# Patient Record
Sex: Male | Born: 1940 | Race: White | Hispanic: No | Marital: Married
Health system: Midwestern US, Community
[De-identification: ages and names within clinical notes are randomized; demographics above are authoritative.]

## PROBLEM LIST (undated history)

## (undated) DIAGNOSIS — R159 Full incontinence of feces: Secondary | ICD-10-CM

## (undated) DIAGNOSIS — D122 Benign neoplasm of ascending colon: Secondary | ICD-10-CM

## (undated) DIAGNOSIS — Z1211 Encounter for screening for malignant neoplasm of colon: Secondary | ICD-10-CM

## (undated) DIAGNOSIS — N201 Calculus of ureter: Secondary | ICD-10-CM

## (undated) DIAGNOSIS — C61 Malignant neoplasm of prostate: Principal | ICD-10-CM

## (undated) DIAGNOSIS — F329 Major depressive disorder, single episode, unspecified: Secondary | ICD-10-CM

## (undated) DIAGNOSIS — Z87442 Personal history of urinary calculi: Secondary | ICD-10-CM

## (undated) DIAGNOSIS — M1711 Unilateral primary osteoarthritis, right knee: Secondary | ICD-10-CM

## (undated) DIAGNOSIS — F32A Depression, unspecified: Secondary | ICD-10-CM

## (undated) DIAGNOSIS — M1990 Unspecified osteoarthritis, unspecified site: Secondary | ICD-10-CM

## (undated) DIAGNOSIS — M75101 Unspecified rotator cuff tear or rupture of right shoulder, not specified as traumatic: Secondary | ICD-10-CM

## (undated) DIAGNOSIS — R972 Elevated prostate specific antigen [PSA]: Secondary | ICD-10-CM

## (undated) DIAGNOSIS — N419 Inflammatory disease of prostate, unspecified: Secondary | ICD-10-CM

## (undated) DIAGNOSIS — E785 Hyperlipidemia, unspecified: Secondary | ICD-10-CM

## (undated) DIAGNOSIS — G4730 Sleep apnea, unspecified: Secondary | ICD-10-CM

## (undated) DIAGNOSIS — M797 Fibromyalgia: Secondary | ICD-10-CM

## (undated) HISTORY — PX: ROTATOR CUFF REPAIR: SHX139

## (undated) HISTORY — DX: Inflammatory disease of prostate, unspecified: N41.9

## (undated) HISTORY — PX: COLONOSCOPY W/ POLYPECTOMY: SHX1380

## (undated) HISTORY — PX: TONSILLECTOMY: SUR1361

## (undated) HISTORY — PX: LEG SURGERY: SHX1003

## (undated) HISTORY — DX: Elevated prostate specific antigen (PSA): R97.20

---

## 2004-01-30 ENCOUNTER — Ambulatory Visit (HOSPITAL_COMMUNITY): Admission: RE | Admit: 2004-01-30 | Discharge: 2004-01-30 | Payer: Self-pay | Admitting: Family Medicine

## 2004-02-08 ENCOUNTER — Encounter: Admission: RE | Admit: 2004-02-08 | Discharge: 2004-02-08 | Payer: Self-pay | Admitting: Internal Medicine

## 2004-03-01 ENCOUNTER — Ambulatory Visit (HOSPITAL_COMMUNITY): Admission: RE | Admit: 2004-03-01 | Discharge: 2004-03-01 | Payer: Self-pay | Admitting: Internal Medicine

## 2004-03-01 ENCOUNTER — Encounter: Admission: RE | Admit: 2004-03-01 | Discharge: 2004-03-01 | Payer: Self-pay | Admitting: Internal Medicine

## 2007-06-30 ENCOUNTER — Ambulatory Visit (HOSPITAL_COMMUNITY): Admission: RE | Admit: 2007-06-30 | Discharge: 2007-06-30 | Payer: Self-pay | Admitting: Family Medicine

## 2007-07-16 HISTORY — PX: TOTAL HIP ARTHROPLASTY: SHX124

## 2007-10-06 ENCOUNTER — Inpatient Hospital Stay (HOSPITAL_COMMUNITY): Admission: RE | Admit: 2007-10-06 | Discharge: 2007-10-10 | Payer: Self-pay | Admitting: Orthopedic Surgery

## 2010-10-30 ENCOUNTER — Ambulatory Visit (INDEPENDENT_AMBULATORY_CARE_PROVIDER_SITE_OTHER): Payer: Medicare Other | Admitting: Urology

## 2010-10-30 DIAGNOSIS — N529 Male erectile dysfunction, unspecified: Secondary | ICD-10-CM

## 2010-10-30 DIAGNOSIS — R310 Gross hematuria: Secondary | ICD-10-CM

## 2010-10-30 DIAGNOSIS — N400 Enlarged prostate without lower urinary tract symptoms: Secondary | ICD-10-CM

## 2010-11-27 NOTE — Discharge Summary (Signed)
NAMECLARIS, Taylor Bean                  ACCOUNT NO.:  [id number]   MEDICAL RECORD NO.:  [id number]          PATIENT TYPE:  INP   LOCATION:  5036                         FACILITY:  [facility]   PHYSICIAN:  Eulas Post, MD    DATE OF BIRTH:  Jun 30, 1941   DATE OF ADMISSION:  10/06/2007  DATE OF DISCHARGE:  10/10/2007                               DISCHARGE SUMMARY   PRIMARY DIAGNOSIS:  Right hip osteoarthritis.   DISCHARGE DIAGNOSIS:  Right hip osteoarthritis.   OPERATIVE PROCEDURE:  Right total hip arthroplasty, performed on October 06, 2007.   DISCHARGE MEDICATIONS:  Coumadin, Ambien, Percocet, and Colace.  We are  dispensing 100 of the Percocet with no refills.   HOSPITAL COURSE:  [name] is a 70 year old man who complained of  right hip arthritis.  He elected to undergo a total hip arthroplasty.  He tolerated the procedure well and postoperatively had no  complications.  He had a routine postoperative course with Coumadin and  sequence of compression devices for DVT prophylaxis.  He also had  perioperative antibiotics.  He passed physical therapy.  He is  weightbearing as tolerated.  His wound were cleaned and he was afebrile  at the time of discharge.  He had a bowel movement.   PLAN:  He will be discharged home with follow up with [place] in  approximately 2-3 weeks.  There were no complications and the patient  benefited maximum from this hospital stay.      Eulas Post, MD  Electronically Signed     JPL/MEDQ  D:  10/10/2007  T:  10/10/2007  Job:  [id number]

## 2010-11-27 NOTE — Op Note (Signed)
Taylor Bean, Taylor Bean                  ACCOUNT NO.:  [id number]   MEDICAL RECORD NO.:  [id number]          PATIENT TYPE:  INP   LOCATION:  2550                         FACILITY:  [facility]   PHYSICIAN:  Eulas Post, MD    DATE OF BIRTH:  01/13/1941   DATE OF PROCEDURE:  10/06/2007  DATE OF DISCHARGE:                               OPERATIVE REPORT   ATTENDING PHYSICIAN:  Eulas Post, M.D.   FIRST ASSISTANT:  Robert A. Thurston Hole, M.D.   SECOND ASSISTANT:  Julien Girt, P.A.-C.   PREOPERATIVE DIAGNOSIS:  Right hip osteoarthritis.   POSTOPERATIVE DIAGNOSIS:  Right hip osteoarthritis.   OPERATIVE PROCEDURE:  Right total hip arthroplasty.   ANESTHESIA:  General.   ESTIMATED BLOOD LOSS:  550 mL.   OPERATIVE IMPLANTS:  DePuy [place] tapered hip stem, non-cemented, size 6  high offset, with a size 36 mm plus 12 femoral head with a size 54  acetabular cup, Pinnacle Sector II, with a Pinnacle Marathon acetabular  line plus 4 10 degrees lipped size 54 by 36.   PREOPERATIVE INDICATIONS:  Taylor Bean is a 70 year old man who  complained of right hip pain.  He had grade 4 degenerative changes in  his hip.  He elected to undergo right total hip arthroplasty.  The  risks, benefits, and alternatives to operative intervention were  discussed with him preoperatively including but not limited to the risks  of infection, bleeding, nerve injury, fracture, dislocation, need for  revision surgery, blood transfusions, blood clots, leg length  discrepancy, cardiopulmonary complications, among others, and he was  willing to proceed.   OPERATIVE FINDINGS:  He had minimal leg length discrepancy  preoperatively.  Postoperatively, we restored him to nearly anatomic  length.  His sciatic nerve was palpated and protected throughout the  case.  His hip was stable throughout functional range of motion after  all implants were in.  He had grade 4 changes on both the acetabulum and  the femoral  head.   OPERATIVE PROCEDURE:  The patient was brought to the operating room and  placed in a supine position.  2 grams intravenous Ancef was given.  General anesthesia was administered.  He was turned into the lateral  decubitus position and all bony prominences were padded.  The right  lower extremity was prepped and draped in the usual sterile fashion.  A  standard posterolateral approach was performed.  The capsule was incised  sharply and tagged with FiberWire.  The hip was then dislocated and we  performed our femoral neck resection.  We then sequentially used the  lateralizing reamer as well as the canal finder and broached up to a  size 6.  An excellent restoration of length was achieved based on our  femoral neck cut.  We then exposed the acetabulum.  The labrum was  removed and we sequentially reamed up to a 53.  The 54 trial fit  extremely well.  We then impacted the real acetabular cup and it had an  excellent fit and was completely stable without any screw fixation.  We  tried the lip liner in the appropriate alignment and ultimately placed  it with the lip being directed at approximately 9 o'clock.  We then  placed the lip liner after having trialed the different femoral head  sizes and selected the above knee components.  The final implants were  placed and the hip was taken through a range of motion and was found to  have excellent stability.  We then repaired the capsule and piriformis  with #2 FiberWire through drill holes in the greater trochanter.  Excellent soft tissue protection posteriorly was achieved.  We then  copiously irrigated the wound and closed the fascia with #1 Ethibond  followed by 2-0 Vicryl for the subcutaneous tissue and a 4-0 Monocryl  for the skin.  Steri-Strips were placed and Marcaine was injected.  A  sterile gauze was applied and an abduction pillow.  He was turned supine  and returned to the PACU in stable, satisfactory condition.   Postoperatively, his leg lengths were approximately equal.  There were  no complications.  The patient tolerated the procedure well.      Eulas Post, MD  Electronically Signed     JPL/MEDQ  D:  10/06/2007  T:  10/06/2007  Job:  [id number]

## 2011-04-08 LAB — BASIC METABOLIC PANEL
BUN: 10
BUN: 18
BUN: 8
CO2: 30
CO2: 30
Calcium: 8.1 — ABNORMAL LOW
Calcium: 8.4
Calcium: 9.9
Chloride: 103
Chloride: 105
Chloride: 97
Creatinine, Ser: 1.06
GFR calc Af Amer: >60
GFR calc Af Amer: >60
GFR calc non Af Amer: >60
GFR calc non Af Amer: >60
GFR calc non Af Amer: >60
Glucose, Bld: 116 — ABNORMAL HIGH
Glucose, Bld: 127 — ABNORMAL HIGH
Glucose, Bld: 128 — ABNORMAL HIGH
Potassium: 3.9
Potassium: 4
Potassium: 4.1
Sodium: 136
Sodium: 137

## 2011-04-08 LAB — CBC
HCT: 26.5 — ABNORMAL LOW
HCT: 41.5
Hemoglobin: 10 — ABNORMAL LOW
Hemoglobin: 9.3 — ABNORMAL LOW
MCHC: 35.1
Platelets: 266
RBC: 2.93 — ABNORMAL LOW
RBC: 2.94 — ABNORMAL LOW
RBC: 3.15 — ABNORMAL LOW
RBC: 4.61
RDW: 12.7
RDW: 12.9
RDW: 13
WBC: 5.6
WBC: 6.7
WBC: 8.3

## 2011-04-08 LAB — PROTIME-INR
INR: 1
INR: 1.8 — ABNORMAL HIGH
Prothrombin Time: 12.9
Prothrombin Time: 15.1

## 2011-04-08 LAB — URINALYSIS, ROUTINE W REFLEX MICROSCOPIC
Glucose, UA: NEGATIVE
Hgb urine dipstick: NEGATIVE
Protein, ur: NEGATIVE
Specific Gravity, Urine: 1.024
pH: 5

## 2011-04-08 LAB — TYPE AND SCREEN: Antibody Screen: NEGATIVE

## 2011-04-30 ENCOUNTER — Ambulatory Visit (INDEPENDENT_AMBULATORY_CARE_PROVIDER_SITE_OTHER): Payer: Medicare Other | Admitting: Urology

## 2011-04-30 DIAGNOSIS — R310 Gross hematuria: Secondary | ICD-10-CM

## 2011-05-02 ENCOUNTER — Encounter (HOSPITAL_COMMUNITY): Payer: Self-pay

## 2011-05-02 ENCOUNTER — Ambulatory Visit (HOSPITAL_COMMUNITY)
Admission: RE | Admit: 2011-05-02 | Discharge: 2011-05-02 | Disposition: A | Discharge status: Home / Self Care | Payer: BC Managed Care – PPO | Source: Ambulatory Visit | Attending: Cardiovascular Disease | Admitting: Cardiovascular Disease

## 2011-05-02 DIAGNOSIS — R079 Chest pain, unspecified: Secondary | ICD-10-CM | POA: Insufficient documentation

## 2011-05-02 HISTORY — DX: Sleep apnea, unspecified: G47.30

## 2011-05-02 HISTORY — DX: Hyperlipidemia, unspecified: E78.5

## 2011-05-02 HISTORY — DX: Unspecified osteoarthritis, unspecified site: M19.90

## 2011-05-02 NOTE — Procedures (Signed)
Taylor Bean, Taylor Bean NO.:  [id number]  MEDICAL RECORD NO.:  [id number]  LOCATION:  [facility]                          FACILITY:  [facility]  PHYSICIAN:  Thurmon Fair, MD     DATE OF BIRTH:  1940/09/03  DATE OF PROCEDURE:  05/02/2011 DATE OF DISCHARGE:                                 STRESS TEST   Treadmill Stress Test  Taylor Bean is a 70 year old gentleman with atypical chest discomfort.  He underwent graded exercise treadmill stress test using a standardized Bruce protocol.  At baseline, the heart rate was 73 beats per minute and blood pressure was 128/62 mmHg.  The patient exercised for a total of 5 minutes and 12 seconds, into the second stage of the Bruce protocol.  The maximum heart rate achieved was 157 beats per minute, which represents the maximum predicted for heart for age.  The peak blood pressure was 208/80 mmHg.  The patient did not complain of angina or any other type of chest discomfort during exercise.  The study was stopped due to fatigue and shortness of breath.  There were no electrocardiographic changes to suggest coronary insufficiency.  ASSESSMENT/PLAN: 1. Fair exercise tolerance. 2. Normotensive response to exercise. 3. No evidence of exercise-induced ischemia.  CONCLUSION:  Normal stress test.     Thurmon Fair, MD     MC/MEDQ  D:  05/02/2011  T:  05/02/2011  Job:  [id number]  cc:   Kirk Ruths, M.D. Fax: [phone]  [facility]

## 2011-05-02 NOTE — Progress Notes (Signed)
Stress Lab Nurses Notes - [facility]  JAMARKIS BRANAM 05/02/2011  Reason for doing test: Chest Pain  Type of test: Regular GTX  Nurse performing test: Parke Poisson, RN  Nuclear Medicine Tech: Not Applicable  Echo Tech: Not Applicable  MD performing test: Thurmon Fair, MD  Family MD: McGough  Test explained and consent signed: yes  IV started: No IV started  Symptoms: Fatigue and slight SOB  Treatment/Intervention: None  Reason test stopped: fatigue and reached target HR  After recovery IV was: NA  Patient to return to Nuc. Med at : NA  Patient discharged: Home  Patient's Condition upon discharge was: stable  Comments: During stress test peak BP 178/78 & HR 157.  Recovery BP 138/70 & HR 88.  Symptoms resolved in recovery.  Erskine Speed T

## 2011-05-02 NOTE — Progress Notes (Signed)
Encounter addended by: Tawni Millers, RN on: 05/02/2011 12:52 PM<BR>     Documentation filed: Visit Diagnoses, Orders

## 2011-11-29 ENCOUNTER — Other Ambulatory Visit: Payer: Self-pay | Admitting: Orthopedic Surgery

## 2011-12-03 ENCOUNTER — Encounter (HOSPITAL_BASED_OUTPATIENT_CLINIC_OR_DEPARTMENT_OTHER): Payer: Self-pay | Admitting: *Deleted

## 2011-12-03 NOTE — Progress Notes (Signed)
No labs needed His pcp wanted a stress test-ni 10/12 Uses cpap-to bring dos-to bring all meds and overnight bag in case he needs to stay

## 2011-12-04 MED ORDER — MIDAZOLAM HCL 2 MG/2ML IJ SOLN
INTRAMUSCULAR | Status: AC
  Filled 2011-12-04: qty 2

## 2011-12-04 MED ORDER — FENTANYL CITRATE 0.05 MG/ML IJ SOLN
INTRAMUSCULAR | Status: AC
  Filled 2011-12-04: qty 2

## 2011-12-06 ENCOUNTER — Ambulatory Visit (HOSPITAL_BASED_OUTPATIENT_CLINIC_OR_DEPARTMENT_OTHER)
Admission: RE | Admit: 2011-12-06 | Payer: Worker's Compensation | Source: Ambulatory Visit | Admitting: Orthopedic Surgery

## 2011-12-06 ENCOUNTER — Encounter (HOSPITAL_BASED_OUTPATIENT_CLINIC_OR_DEPARTMENT_OTHER): Admission: RE | Payer: Self-pay | Source: Ambulatory Visit

## 2011-12-06 HISTORY — DX: Major depressive disorder, single episode, unspecified: F32.9

## 2011-12-06 HISTORY — DX: Depression, unspecified: F32.A

## 2011-12-06 SURGERY — SHOULDER ARTHROSCOPY WITH OPEN ROTATOR CUFF REPAIR AND DISTAL CLAVICLE ACROMINECTOMY
Anesthesia: General | Site: Shoulder | Laterality: Right

## 2011-12-11 ENCOUNTER — Other Ambulatory Visit: Payer: Self-pay | Admitting: Orthopedic Surgery

## 2011-12-11 NOTE — Progress Notes (Signed)
Surgery r/s due to dr Dion Saucier schedule-reviewed all preop-

## 2011-12-13 ENCOUNTER — Encounter (HOSPITAL_BASED_OUTPATIENT_CLINIC_OR_DEPARTMENT_OTHER): Payer: Self-pay | Admitting: Certified Registered Nurse Anesthetist

## 2011-12-13 ENCOUNTER — Encounter (HOSPITAL_BASED_OUTPATIENT_CLINIC_OR_DEPARTMENT_OTHER)
Admission: RE | Disposition: A | Discharge status: Home / Self Care | Payer: Self-pay | Source: Ambulatory Visit | Attending: Orthopedic Surgery

## 2011-12-13 ENCOUNTER — Encounter (HOSPITAL_BASED_OUTPATIENT_CLINIC_OR_DEPARTMENT_OTHER): Payer: Self-pay | Admitting: Orthopedic Surgery

## 2011-12-13 ENCOUNTER — Encounter (HOSPITAL_BASED_OUTPATIENT_CLINIC_OR_DEPARTMENT_OTHER): Payer: Self-pay

## 2011-12-13 ENCOUNTER — Ambulatory Visit (HOSPITAL_BASED_OUTPATIENT_CLINIC_OR_DEPARTMENT_OTHER): Payer: Worker's Compensation | Admitting: Certified Registered Nurse Anesthetist

## 2011-12-13 ENCOUNTER — Ambulatory Visit (HOSPITAL_BASED_OUTPATIENT_CLINIC_OR_DEPARTMENT_OTHER)
Admission: RE | Admit: 2011-12-13 | Discharge: 2011-12-13 | Disposition: A | Discharge status: Home / Self Care | Payer: Worker's Compensation | Source: Ambulatory Visit | Attending: Orthopedic Surgery | Admitting: Orthopedic Surgery

## 2011-12-13 DIAGNOSIS — M719 Bursopathy, unspecified: Secondary | ICD-10-CM | POA: Insufficient documentation

## 2011-12-13 DIAGNOSIS — E785 Hyperlipidemia, unspecified: Secondary | ICD-10-CM | POA: Insufficient documentation

## 2011-12-13 DIAGNOSIS — M67919 Unspecified disorder of synovium and tendon, unspecified shoulder: Secondary | ICD-10-CM | POA: Insufficient documentation

## 2011-12-13 DIAGNOSIS — M75101 Unspecified rotator cuff tear or rupture of right shoulder, not specified as traumatic: Secondary | ICD-10-CM

## 2011-12-13 DIAGNOSIS — G4733 Obstructive sleep apnea (adult) (pediatric): Secondary | ICD-10-CM | POA: Insufficient documentation

## 2011-12-13 HISTORY — DX: Unspecified rotator cuff tear or rupture of right shoulder, not specified as traumatic: M75.101

## 2011-12-13 LAB — POCT HEMOGLOBIN-HEMACUE: Hemoglobin: 14.9 g/dL (ref 13.0–17.0)

## 2011-12-13 SURGERY — ARTHROSCOPY, SHOULDER, WITH ROTATOR CUFF REPAIR
Anesthesia: General | Site: Shoulder | Laterality: Right

## 2011-12-13 MED ORDER — SUCCINYLCHOLINE CHLORIDE 20 MG/ML IJ SOLN
INTRAMUSCULAR | Status: DC | PRN
  Administered 2011-12-13: 140 mg via INTRAVENOUS

## 2011-12-13 MED ORDER — PROPOFOL 10 MG/ML IV EMUL
INTRAVENOUS | Status: DC | PRN
  Administered 2011-12-13: 170 mg via INTRAVENOUS
  Administered 2011-12-13: 80 mg via INTRAVENOUS

## 2011-12-13 MED ORDER — PROMETHAZINE HCL 25 MG PO TABS: 25.0000 mg | ORAL_TABLET | Freq: Four times a day (QID) | ORAL | Status: DC | PRN

## 2011-12-13 MED ORDER — FENTANYL CITRATE 0.05 MG/ML IJ SOLN
100.0000 ug | Freq: Once | INTRAMUSCULAR | Status: AC
  Administered 2011-12-13: 100 ug via INTRAVENOUS

## 2011-12-13 MED ORDER — LACTATED RINGERS IV SOLN
INTRAVENOUS | Status: DC
  Administered 2011-12-13 (×2): via INTRAVENOUS

## 2011-12-13 MED ORDER — CEFAZOLIN SODIUM 1-5 GM-% IV SOLN: 1.0000 g | INTRAVENOUS | Status: DC

## 2011-12-13 MED ORDER — FENTANYL CITRATE 0.05 MG/ML IJ SOLN
INTRAMUSCULAR | Status: DC | PRN
  Administered 2011-12-13 (×2): 50 ug via INTRAVENOUS

## 2011-12-13 MED ORDER — MIDAZOLAM HCL 2 MG/2ML IJ SOLN
2.0000 mg | Freq: Once | INTRAMUSCULAR | Status: AC
  Administered 2011-12-13: 2 mg via INTRAVENOUS

## 2011-12-13 MED ORDER — CEFAZOLIN SODIUM 1-5 GM-% IV SOLN
1.0000 g | INTRAVENOUS | Status: AC
  Administered 2011-12-13: 2 g via INTRAVENOUS

## 2011-12-13 MED ORDER — OXYCODONE-ACETAMINOPHEN 10-325 MG PO TABS: 1.0000 | ORAL_TABLET | Freq: Four times a day (QID) | ORAL | Status: AC | PRN

## 2011-12-13 MED ORDER — LIDOCAINE HCL (CARDIAC) 20 MG/ML IV SOLN
INTRAVENOUS | Status: DC | PRN
  Administered 2011-12-13: 40 mg via INTRAVENOUS

## 2011-12-13 MED ORDER — SODIUM CHLORIDE 0.9 % IR SOLN
Status: DC | PRN
  Administered 2011-12-13: 21000 mL

## 2011-12-13 MED ORDER — DEXAMETHASONE SODIUM PHOSPHATE 4 MG/ML IJ SOLN
INTRAMUSCULAR | Status: DC | PRN
  Administered 2011-12-13: 6 mg via INTRAVENOUS

## 2011-12-13 MED ORDER — METHOCARBAMOL 500 MG PO TABS: 500.0000 mg | ORAL_TABLET | Freq: Four times a day (QID) | ORAL | Status: AC

## 2011-12-13 MED ORDER — ONDANSETRON HCL 4 MG/2ML IJ SOLN
INTRAMUSCULAR | Status: DC | PRN
  Administered 2011-12-13: 4 mg via INTRAVENOUS

## 2011-12-13 SURGICAL SUPPLY — 69 items
ANCHOR SUT BIO SW 4.75X19.1 (Anchor) ×4 IMPLANT
BENZOIN TINCTURE PRP APPL 2/3 (GAUZE/BANDAGES/DRESSINGS) ×2 IMPLANT
BLADE 4.2CUDA (BLADE) IMPLANT
BLADE CUDA GRT WHITE 3.5 (BLADE) IMPLANT
BLADE CUTTER GATOR 3.5 (BLADE) ×2 IMPLANT
BLADE GREAT WHITE 4.2 (BLADE) IMPLANT
BLADE SURG 15 STRL LF DISP TIS (BLADE) IMPLANT
BLADE SURG 15 STRL SS (BLADE)
BUR OVAL 6.0 (BURR) ×2 IMPLANT
CANISTER OMNI JUG 16 LITER (MISCELLANEOUS) ×2 IMPLANT
CANNULA 5.75X71 LONG (CANNULA) ×2 IMPLANT
CANNULA ACUFLEX KIT 5X76 (CANNULA) IMPLANT
CANNULA TWIST IN 8.25X7CM (CANNULA) ×2 IMPLANT
CANNULA TWIST IN 8.25X9CM (CANNULA) IMPLANT
CLOTH BEACON ORANGE TIMEOUT ST (SAFETY) ×2 IMPLANT
CUTTER MENISCUS  4.2MM (BLADE)
CUTTER MENISCUS 4.2MM (BLADE) IMPLANT
DECANTER SPIKE VIAL GLASS SM (MISCELLANEOUS) IMPLANT
DRAPE INCISE IOBAN 66X45 STRL (DRAPES) ×2 IMPLANT
DRAPE SHOULDER BEACH CHAIR (DRAPES) ×2 IMPLANT
DRAPE U 20/CS (DRAPES) ×2 IMPLANT
DRAPE U-SHAPE 47X51 STRL (DRAPES) ×2 IMPLANT
DRSG PAD ABDOMINAL 8X10 ST (GAUZE/BANDAGES/DRESSINGS) ×2 IMPLANT
DURAPREP 26ML APPLICATOR (WOUND CARE) ×2 IMPLANT
ELECT REM PT RETURN 9FT ADLT (ELECTROSURGICAL) ×2
ELECTRODE REM PT RTRN 9FT ADLT (ELECTROSURGICAL) ×1 IMPLANT
FIBERSTICK 2 (SUTURE) IMPLANT
GLOVE BIO SURGEON STRL SZ 6 (GLOVE) ×2 IMPLANT
GLOVE BIO SURGEON STRL SZ 6.5 (GLOVE) ×2 IMPLANT
GLOVE BIO SURGEON STRL SZ8 (GLOVE) ×2 IMPLANT
GLOVE BIOGEL PI IND STRL 7.0 (GLOVE) ×2 IMPLANT
GLOVE BIOGEL PI IND STRL 8 (GLOVE) ×2 IMPLANT
GLOVE BIOGEL PI INDICATOR 7.0 (GLOVE) ×2
GLOVE BIOGEL PI INDICATOR 8 (GLOVE) ×2
GLOVE INDICATOR 6.5 STRL GRN (GLOVE) ×2 IMPLANT
GLOVE ORTHO TXT STRL SZ7.5 (GLOVE) ×2 IMPLANT
GOWN PREVENTION PLUS XLARGE (GOWN DISPOSABLE) IMPLANT
GOWN PREVENTION PLUS XXLARGE (GOWN DISPOSABLE) IMPLANT
IMMOBILIZER SHOULDER XLGE (ORTHOPEDIC SUPPLIES) ×2 IMPLANT
KIT SHOULDER TRACTION (DRAPES) ×2 IMPLANT
NEEDLE SCORPION MULTI FIRE (NEEDLE) ×2 IMPLANT
PACK ARTHROSCOPY DSU (CUSTOM PROCEDURE TRAY) ×2 IMPLANT
PACK BASIN DAY SURGERY FS (CUSTOM PROCEDURE TRAY) ×2 IMPLANT
SET ARTHROSCOPY TUBING (MISCELLANEOUS) ×1
SET ARTHROSCOPY TUBING LN (MISCELLANEOUS) ×1 IMPLANT
SHEET MEDIUM DRAPE 40X70 STRL (DRAPES) ×2 IMPLANT
SLEEVE SCD COMPRESS KNEE MED (MISCELLANEOUS) ×2 IMPLANT
SLING ARM FOAM STRAP LRG (SOFTGOODS) IMPLANT
SLING ARM FOAM STRAP MED (SOFTGOODS) IMPLANT
SLING ARM FOAM STRAP XLG (SOFTGOODS) IMPLANT
SLING ARM IMMOBILIZER LRG (SOFTGOODS) IMPLANT
SLING ARM IMMOBILIZER MED (SOFTGOODS) IMPLANT
SPONGE GAUZE 4X4 12PLY (GAUZE/BANDAGES/DRESSINGS) ×2 IMPLANT
STRIP CLOSURE SKIN 1/2X4 (GAUZE/BANDAGES/DRESSINGS) ×2 IMPLANT
SUT FIBERWIRE #2 38 T-5 BLUE (SUTURE)
SUT LASSO 45 DEGREE (SUTURE) ×2 IMPLANT
SUT MNCRL AB 4-0 PS2 18 (SUTURE) IMPLANT
SUT PDS AB 1 CT  36 (SUTURE)
SUT PDS AB 1 CT 36 (SUTURE) IMPLANT
SUT TIGER TAPE 7 IN WHITE (SUTURE) ×2 IMPLANT
SUT VIC AB 3-0 SH 27 (SUTURE)
SUT VIC AB 3-0 SH 27X BRD (SUTURE) IMPLANT
SUTURE FIBERWR #2 38 T-5 BLUE (SUTURE) IMPLANT
TAPE FIBER 2MM 7IN #2 BLUE (SUTURE) ×2 IMPLANT
TOWEL OR 17X24 6PK STRL BLUE (TOWEL DISPOSABLE) IMPLANT
TOWEL OR NON WOVEN STRL DISP B (DISPOSABLE) ×2 IMPLANT
TUBE CONNECTING 20X1/4 (TUBING) ×2 IMPLANT
WAND STAR VAC 90 (SURGICAL WAND) ×2 IMPLANT
WATER STERILE IRR 1000ML POUR (IV SOLUTION) ×2 IMPLANT

## 2011-12-13 NOTE — H&P (Signed)
PREOPERATIVE H&P  Chief Complaint: right shoulder complete rotator cuff rupture  HPI: Taylor Bean is a 71 y.o. male who presents for preoperative history and physical with a diagnosis of right shoulder complete rotator cuff rupture. Symptoms are rated as moderate to severe, and have been worsening.  This is significantly impairing activities of daily living.  He has elected for surgical management.   Past Medical History  Diagnosis Date  . Hyperlipidemia   . Sleep apnea     uses a cpap-will bring dos  . Arthritis     general  . Depression    Past Surgical History  Procedure Date  . 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 Joint replacement 2009    rt total hip  . Tonsillectomy   . Leg surgery     fx rt leg as 71 yr old   History   Social History  . Marital Status: Married    Spouse Name: N/A    Number of Children: N/A  . Years of Education: N/A   Social History Main Topics  . Smoking status: Former Smoker -- 0.2 packs/day for 10 years    Types: Cigarettes    Quit date: 08/01/1968  . Smokeless tobacco: Not on file  . Alcohol Use: 0.6 oz/week    1 Cans of beer per week  . Drug Use: No  . Sexually Active:    Other Topics Concern  . Not on file   Social History Narrative  . No narrative on file   Family History  Problem Relation Age of Onset  . Heart attack Father    No Known Allergies Prior to Admission medications   Medication Sig Start Date End Date Taking? Authorizing Provider  aspirin 81 MG tablet Take 81 mg by mouth daily.      Historical Provider, MD  diclofenac (VOLTAREN) 75 MG EC tablet Take 75 mg by mouth 2 (two) times daily.      Historical Provider, MD  escitalopram (LEXAPRO) 10 MG tablet Take 10 mg by mouth daily.      Historical Provider, MD  FLUoxetine (PROZAC) 20 MG capsule Take 20 mg by mouth daily.      Historical Provider, MD  simvastatin (ZOCOR) 20 MG tablet Take 20 mg by mouth at bedtime.      Historical Provider, MD  traMADol (ULTRAM)  50 MG tablet Take 50 mg by mouth every 6 (six) hours as needed.    Historical Provider, MD     Positive ROS: All other systems have been reviewed and were otherwise negative with the exception of those mentioned in the HPI and as above.  Physical Exam: General: Alert, no acute distress Cardiovascular: No pedal edema Respiratory: No cyanosis, no use of accessory musculature GI: No organomegaly, abdomen is soft and non-tender Skin: No lesions in the area of chief complaint Neurologic: Sensation intact distally Psychiatric: Patient is competent for consent with normal mood and affect Lymphatic: No axillary or cervical lymphadenopathy  MUSCULOSKELETAL: Active forward flexion is 0-160 with a positive drop arm sign, and weakness with overhead function.  Assessment: right shoulder complete rotator cuff rupture  Plan: Plan for Procedure(s): SHOULDER ARTHROSCOPY WITH ROTATOR CUFF REPAIR, acromioplasty, probable debridement.  The risks benefits and alternatives were discussed with the patient including but not limited to the risks of nonoperative treatment, versus surgical intervention including infection, bleeding, nerve injury,  blood clots, cardiopulmonary complications, morbidity, mortality, among others, and they were willing to proceed. We've also discussed  the risks of recurrent rupture, incomplete relief of symptoms, the need for revision surgery, among others.  Eulas Post, MD 12/13/2011 8:40 AM

## 2011-12-13 NOTE — Anesthesia Procedure Notes (Addendum)
Anesthesia Regional Block:  Interscalene brachial plexus block  Pre-Anesthetic Checklist: ,, timeout performed, Correct Patient, Correct Site, Correct Laterality, Correct Procedure, Correct Position, site marked, Risks and benefits discussed,  Surgical consent,  Pre-op evaluation,  At surgeon's request and post-op pain management  Laterality: Right  Prep: chloraprep       Needles:  Injection technique: Single-shot  Needle Type: Echogenic Stimulator Needle      Needle Gauge: 22 and 22 G    Additional Needles:  Procedures: ultrasound guided Interscalene brachial plexus block Narrative:  Start time: 12/13/2011 9:55 AM End time: 12/13/2011 10:05 AM  Performed by: Personally   Additional Notes: 30 cc 0.5% Naropin injected without difficulty   Procedure Name: Intubation Date/Time: 12/13/2011 12:16 PM Performed by: Verlan Friends Pre-anesthesia Checklist: Patient identified, Emergency Drugs available, Suction available, Patient being monitored and Timeout performed Patient Re-evaluated:Patient Re-evaluated prior to inductionOxygen Delivery Method: Circle System Utilized Preoxygenation: Pre-oxygenation with 100% oxygen Intubation Type: IV induction Ventilation: Mask ventilation without difficulty Laryngoscope size: intubated with #4 glidescope by D. Joslin. Grade View: Grade III Tube type: Oral Tube size: 8.0 mm Number of attempts: 2 (no visualization with #3 Hyacinth Meeker, immediately went with glidescope) Airway Equipment and Method: stylet and oral airway Placement Confirmation: ETT inserted through vocal cords under direct vision,  positive ETCO2 and breath sounds checked- equal and bilateral Secured at: 23 cm Tube secured with: Tape (pink tape used) Dental Injury: Teeth and Oropharynx as per pre-operative assessment  Difficulty Due To: Difficulty was anticipated, Difficult Airway- due to reduced neck mobility and Difficult Airway- due to anterior larynx Future  Recommendations: Recommend- induction with short-acting agent, and alternative techniques readily available Comments: Poor neck extension, cords anterior.  Great visualization with glidescope #4.

## 2011-12-13 NOTE — Discharge Instructions (Addendum)
Shoulder Arthroscopy Because the shoulder is one of the most mobile joints, it is more prone to injury. It is a very shallow ball and socket joint located between the large bone in your upper arm (humerus) and the shoulder blade (scapula). Arthroscopy is a valuable test for evaluating and treating injuries involving the shoulder joint. Arthroscopy is a surgical technique which uses small incisions (cuts by the surgeon) to insert a small telescope like instrument (arthroscope) and other tools into the shoulder. This allows the surgeon to look directly at the problem. When the arthroscope is in the joint, fluid is used to expand the joint space. This allows the surgeon to examine it more easily. The arthroscope then beams light into the joint and sends an image to a TV screen. As your surgeon examines your shoulder, he or she can also repair a number of problems found at the same time. Sometimes the procedure may change to an open surgery. This would happen if the problems are severe enough that they cannot be corrected with just arthroscopy. This is usually a very safe surgery. Rare complications include damage to nerves or blood vessels, excess bleeding, blood clots, infection, and rarely instrument failure. This is most often performed as a same day surgery. This means you will not have to stay in the hospital overnight. Recovery from this surgery is also much faster than having an open procedure. LET YOUR CAREGIVER KNOW ABOUT:  Allergies.   Medications taken including herbs, eye drops, over the counter medications, and creams.   Use of steroids (by mouth or creams).   Previous problems with anesthetics or novocaine.   Possibility of pregnancy, if this applies.   History of blood clots (thrombophlebitis).   History of bleeding or blood problems.   Previous surgery.   Other health problems.   Family history of anesthetic problems.  BEFORE THE PROCEDURE   Stop all anti-inflammatory  medications at least one week before surgery unless instructed otherwise. Tell your surgeon if you have been taking cortisone or other steroids.   Do not eat or drink after midnight or as instructed. Take medications as directed by your caregiver. You may have lab tests the morning of surgery.   You should be present 60 minutes prior to your procedure or as directed.  PROCEDURE  You may have general (go to sleep) or local (numb the area) anesthetic. Your surgeon will discuss this with you. During the procedure as discussed above, your surgeon may find a variety of problems which he or she can improve or correct using small instruments. When the procedure is finished the tiny incisions will be closed with stitches or tape. AFTER YOUR PROCEDURE  After surgery you will be taken to the recovery area. A nurse will watch and check your progress. Once you are awake, stable, and taking fluids well, barring other problems you will be allowed to go home.   Once home, apply an ice pack to your operative site for twenty minutes, three to four times per day, for two to three days. This may help with discomfort and keep the swelling down.   Use a sling and medications if prescribed or as instructed.   Unless your caregiver advises otherwise, move your arm and shoulder gently and frequently following the procedure. This can help prevent stiffness and swelling.  REHABILITATION  Almost as important as your surgery is your rehabilitation. If physical therapy and exercises are prescribed by your surgeon, follow them diligently. Once comfortable and on your way  to full use, do muscle strengthening exercises as instructed.   Only take over-the-counter or prescription medicines for pain, discomfort, or fever as directed by your caregiver.  SEEK IMMEDIATE MEDICAL CARE IF:   There is redness, swelling, or increasing pain in the wound or joint.   You notice purulent (colored- pus-like) drainage coming from the  wound.   An unexplained oral temperature above 102 F (38.9 C) develops.   You notice a foul smell coming from the wound or dressing.   There is a breaking open of the wound. The edges do not stay together after sutures or tape has been removed.   Persistent bleeding from the small incision.  Document Released: 06/28/2000 Document Revised: 06/20/2011 Document Reviewed: 10/17/2008 ExitCare Patient Information 2012 [place], [facility]   Post Anesthesia Home Care Instructions  Activity: Get plenty of rest for the remainder of the day. A responsible adult should stay with you for 24 hours following the procedure.  For the next 24 hours, DO NOT: -Drive a car -Advertising copywriter -Drink alcoholic beverages -Take any medication unless instructed by your physician -Make any legal decisions or sign important papers.  Meals: Start with liquid foods such as gelatin or soup. Progress to regular foods as tolerated. Avoid greasy, spicy, heavy foods. If nausea and/or vomiting occur, drink only clear liquids until the nausea and/or vomiting subsides. Call your physician if vomiting continues.  Special Instructions/Symptoms: Your throat may feel dry or sore from the anesthesia or the breathing tube placed in your throat during surgery. If this causes discomfort, gargle with warm salt water. The discomfort should disappear within 24 hours.   Call your surgeon if you experience:   1.  Fever over 101.0. 2.  Inability to urinate. 3.  Nausea and/or vomiting. 4.  Extreme swelling or bruising at the surgical site. 5.  Continued bleeding from the incision. 6.  Increased pain, redness or drainage from the incision. 7.  Problems related to your pain medication.   Regional Anesthesia Blocks  1. Numbness or the inability to move the "blocked" extremity may last from 3-48 hours after placement. The length of time depends on the medication injected and your individual response to the medication. If the  numbness is not going away after 48 hours, call your surgeon.  2. The extremity that is blocked will need to be protected until the numbness is gone and the  Strength has returned. Because you cannot feel it, you will need to take extra care to avoid injury. Because it may be weak, you may have difficulty moving it or using it. You may not know what position it is in without looking at it while the block is in effect.  3. For blocks in the legs and feet, returning to weight bearing and walking needs to be done carefully. You will need to wait until the numbness is entirely gone and the strength has returned. You should be able to move your leg and foot normally before you try and bear weight or walk. You will need someone to be with you when you first try to ensure you do not fall and possibly risk injury.  4. Bruising and tenderness at the needle site are common side effects and will resolve in a few days.  5. Persistent numbness or new problems with movement should be communicated to the surgeon or the [facility] [phone] [facility] [phone]). Marland Kitchen

## 2011-12-13 NOTE — Op Note (Signed)
12/13/2011  2:30 PM  PATIENT:  Taylor Bean    PRE-OPERATIVE DIAGNOSIS:  right shoulder complete rotator cuff rupture  POST-OPERATIVE DIAGNOSIS:  Same  PROCEDURE:  SHOULDER ARTHROSCOPY WITH ROTATOR CUFF REPAIR, acromioplasty, limited debridement of the labrum.  SURGEON:  Eulas Post, MD  PHYSICIAN ASSISTANT: Janace Litten, OPA-C, present and scrubbed throughout the case, critical for completion in a timely fashion, and for retraction, instrumentation, and closure.  ANESTHESIA:   General  PREOPERATIVE INDICATIONS:  Taylor Bean is a  71 y.o. male with a diagnosis of right shoulder complete rotator cuff rupture who failed conservative measures and elected for surgical management.    The risks benefits and alternatives were discussed with the patient preoperatively including but not limited to the risks of infection, bleeding, nerve injury, cardiopulmonary complications, the need for revision surgery, among others, and the patient was willing to proceed.  OPERATIVE IMPLANTS: Arthrex bio composite swivel lock 4.75 mm x2 with a total of 2 inverted fiber tape sutures.  OPERATIVE FINDINGS: There was a full-thickness rotator cuff tear with delamination and retraction, measuring approximately 2 x 2 centimeters. The rotator cuff tendon was quite good, particularly on the inferior leaflet. The biceps tendon was intact. The glenohumeral joint had reasonably good cartilage. The superior labrum had fraying, as well as some of the anterior labrum. The infraspinatus was intact, along with the subscapularis and the biceps pulley. There was substantial subacromial spurring, and in fact what appeared to be an unfused os acromiale anteriorly. This is fairly small.  OPERATIVE PROCEDURE: The patient was brought to the operating room and placed in the supine position. General anesthesia was administered. IV antibiotics were given. He was turned into the semilateral decubitus position and all bony prominences  were padded. We took special care because of his previous neck problems. The right upper extremity was, and did have some mild stiffness with overhead activity. The right upper extremity was prepped and draped in usual sterile fashion. Time out was performed. Diagnostic arthroscopy was carried out the above-named findings. The arthroscopic shaver was used to debride the anterior labrum as well as the superior labrum as well as portions of the undersurface of the rotator cuff.  I then went into the subacromial space. Complete bursectomy was performed with the shaver. I released the CA ligament and then performed an acromioplasty with a bur. I also prepared the tuberosity with a bur.  I then viewed from the lateral portal, and I tried to use the scorpion, but the tendon was too thick, so and it up using the minimize her portal, with a 45 suture lasso, 2 grasp both the superior and inferior leaflet of the rotator cuff. This was excellent quality tissue, and I then passed a total of 2 inverted fiber tape, one anteriorly and one posteriorly.  I then anchored these to the bone using swivel lock anchors from Arthrex. This was placed laterally, and the tendon mobilized back to its anatomic footprint. Excellent fixation was achieved. I confirmed the acromioplasty viewing from the lateral view. I then removed all of the arthroscopic instruments, and repaired the portals with Monocryl followed by Steri-Strips and sterile gauze. He was awakened and returned back in stable and satisfactory condition. There no complications and he tolerated the procedure well.

## 2011-12-13 NOTE — Anesthesia Postprocedure Evaluation (Signed)
Anesthesia Post Note  Patient: Taylor Bean  Procedure(s) Performed: Procedure(s) (LRB): SHOULDER ARTHROSCOPY WITH ROTATOR CUFF REPAIR (Right)  Anesthesia type: general  Patient location: PACU  Post pain: Pain level controlled  Post assessment: Patient's Cardiovascular Status Stable  Last Vitals:  Filed Vitals:   12/13/11 1515  BP: 136/71  Pulse: 77  Temp:   Resp: 13    Post vital signs: Reviewed and stable  Level of consciousness: sedated  Complications: No apparent anesthesia complications

## 2011-12-13 NOTE — Transfer of Care (Signed)
Immediate Anesthesia Transfer of Care Note  Patient: Taylor Bean  Procedure(s) Performed: Procedure(s) (LRB): SHOULDER ARTHROSCOPY WITH ROTATOR CUFF REPAIR (Right)  Patient Location: PACU  Anesthesia Type: GA combined with regional for post-op pain  Level of Consciousness: awake, alert , oriented and patient cooperative  Airway & Oxygen Therapy: Patient Spontanous Breathing and Patient connected to face mask oxygen  Post-op Assessment: Report given to PACU RN and Post -op Vital signs reviewed and stable  Post vital signs: Reviewed and stable  Complications: No apparent anesthesia complications

## 2011-12-13 NOTE — Anesthesia Preprocedure Evaluation (Signed)
Anesthesia Evaluation  Patient identified by MRN, date of birth, ID band Patient awake    Reviewed: Allergy & Precautions, H&P , NPO status , Patient's Chart, lab work & pertinent test results  Airway Mallampati: II TM Distance: >3 FB     Dental  (+) Teeth Intact   Pulmonary  breath sounds clear to auscultation        Cardiovascular Rhythm:Regular Rate:Normal     Neuro/Psych    GI/Hepatic   Endo/Other    Renal/GU      Musculoskeletal   Abdominal   Peds  Hematology   Anesthesia Other Findings   Reproductive/Obstetrics                           Anesthesia Physical Anesthesia Plan  ASA: II  Anesthesia Plan: General   Post-op Pain Management:    Induction:   Airway Management Planned: Oral ETT  Additional Equipment:   Intra-op Plan:   Post-operative Plan: Extubation in OR  Informed Consent: I have reviewed the patients History and Physical, chart, labs and discussed the procedure including the risks, benefits and alternatives for the proposed anesthesia with the patient or authorized representative who has indicated his/her understanding and acceptance.   Dental advisory given  Plan Discussed with:   Anesthesia Plan Comments:         Anesthesia Quick Evaluation

## 2011-12-13 NOTE — Progress Notes (Signed)
Assisted Dr. Joslin with right, ultrasound guided, interscalene  block. Side rails up, monitors on throughout procedure. See vital signs in flow sheet. Tolerated Procedure well. 

## 2011-12-20 ENCOUNTER — Encounter (HOSPITAL_BASED_OUTPATIENT_CLINIC_OR_DEPARTMENT_OTHER): Payer: Self-pay

## 2012-01-28 ENCOUNTER — Ambulatory Visit (HOSPITAL_COMMUNITY)
Admission: RE | Admit: 2012-01-28 | Discharge: 2012-01-28 | Disposition: A | Discharge status: Home / Self Care | Payer: Worker's Compensation | Source: Ambulatory Visit | Attending: Orthopedic Surgery | Admitting: Orthopedic Surgery

## 2012-01-28 DIAGNOSIS — M25519 Pain in unspecified shoulder: Secondary | ICD-10-CM | POA: Insufficient documentation

## 2012-01-28 DIAGNOSIS — M25619 Stiffness of unspecified shoulder, not elsewhere classified: Secondary | ICD-10-CM | POA: Insufficient documentation

## 2012-01-28 DIAGNOSIS — IMO0001 Reserved for inherently not codable concepts without codable children: Secondary | ICD-10-CM | POA: Insufficient documentation

## 2012-01-28 DIAGNOSIS — M6281 Muscle weakness (generalized): Secondary | ICD-10-CM | POA: Insufficient documentation

## 2012-01-28 NOTE — Evaluation (Signed)
Occupational Therapy Evaluation  Patient Details  Name: Taylor Bean MRN: [id number] Date of Birth: [birth year]  Today's Date: 01/28/2012 Time: 1120-1205 OT Time Calculation (min): 45 min  Evaluation: 4098-1191 35' Heat: 4782-9562 10' Visit#: 1  of 24   Re-eval: 02/18/12  Assessment Diagnosis: s/p R RCR Surgical Date: 11/08/11 Next MD Visit: 4 weeks Prior Therapy: None  Past Medical History:  Past Medical History  Diagnosis Date  . Hyperlipidemia   . Sleep apnea     uses a cpap-will bring dos  . Arthritis     general  . Depression   . Right rotator cuff tear 12/13/2011   Past Surgical History:  Past Surgical History  Procedure Date  . 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 Joint replacement 2009    rt total hip  . Tonsillectomy   . Leg surgery     fx rt leg as 71 yr old    Subjective Symptoms/Limitations Symptoms: S: I hurt it at work lifting something out of a cart. Pertinent History: Taylor Bean is a [employer] employee and injured his R shoulder when lifting a heavy object out of a cart for a customer. He experienced a sudden sharp pain with constant pain after. An MRI showed a torn rotator cuff which was repaired on 11/08/11. He has been referred to occupational therapy for evaluation and treatment. Patient Stated Goals: "To get my arm functional again" Pain Assessment Currently in Pain?: Other (Comment) (At rest pt experiences 0/10 pain. 7/10 pain with activity.) Multiple Pain Sites: No  Precautions/Restrictions  Precautions Precautions: None. Per Dr. Shelba Bean protocol, at 11 weeks post surgery therapy for Taylor Bean can progress as tolerated. [facility] to [facility] for further detail.  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 Leisure: Hobbies-yes (Comment) Comments: Yardwork, gardening  Assessment ADL/Vision/Perception ADL ADL Comments: Taylor Bean reports he is currently using his L dominant extremity to complete most BADLs independently, such as shaving, brushing teeth, etc. He attempts to use RUE as assist at waist and shoulder level but often experiences discomfort. Dominant Hand: Left Vision - History Baseline Vision: No visual deficits Praxis Praxis: Not tested  Cognition/Observation Cognition Overall Cognitive Status: Appears within functional limits for tasks assessed Arousal/Alertness: Awake/alert Orientation Level: Oriented X4 Observation/Other Assessments Other Assessments: UEFI 17/80 (21%)  Sensation/Coordination/Edema Sensation Light Touch: Appears Intact Stereognosis: Not tested Hot/Cold: Not tested Proprioception: Not tested Coordination Gross Motor Movements are Fluid and Coordinated: Yes Fine Motor Movements are Fluid and Coordinated: Yes  Additional Assessments RUE PROM (degrees) RUE Overall PROM Comments: Supine Right Shoulder Flexion: 102 Degrees Right Shoulder ABduction: 70 Degrees Right Shoulder Internal Rotation: 50 Degrees Right Shoulder External Rotation: 5 Degrees LUE Assessment LUE Assessment: Within Functional Limits Palpation Palpation: Maximal fascial restrictions           Occupational Therapy Assessment and Plan OT Assessment and Plan Clinical Impression Statement: A: 71 y/o male presents with increased pain and decreased use of RUE secondary to rotator cuff repair surgery on 11/08/11. Pt will benefit from skilled therapeutic intervention in order to improve on the following deficits: Decreased activity tolerance;Decreased range of motion;Decreased strength;Increased fascial restricitons;Increased muscle spasms;Impaired  UE functional use;Pain  Rehab Potential: Excellent OT Frequency: Min 3X/week OT Duration: 8 weeks OT Treatment/Interventions: Self-care/ADL training;Therapeutic exercise;Modalities;Manual therapy;Therapeutic activities;Patient/family education OT Plan: P: Skilled OT intervention 3x per week to decrease pain and restrictions and increase P/AROM and strength needed to return to prior level of I with B/IADLs, work and leisure activities. TX Plan: MFR and manual stretching of RUE; isometric strengthening; bridging for improved ability to get in/out of bed; PROM transitioning to AAROM once passive improves; seated scapular exercises; therapy ball; elbow strengthening with 1#.   Goals Short Term Goals Time to Complete Short Term Goals: 4 weeks Short Term Goal 1: Patient will be educated on HEP. Short Term Goal 2: Patient's R shoulder AROM will be assessed. Short Term Goal 3: Patient will increase PROM to WNL to increase ease with bathing and dressing. Short Term Goal 4: Patient will increase right shoulder strength to 3+/5 for increased ability to lift pots and pans to the stove. Short Term Goal 5: Patient will decrease pain to 4/10 when getting in and out of bed. Additional Short Term Goals?: Yes Short Term Goal 6: Patient will decrease fascial restrictions from maximal to moderate in R shoulder region. Long Term Goals Time to Complete Long Term Goals: 8 weeks Long Term Goal 1: Patient will return to PLOF with all B/IADL, work and leisure activities. Long Term Goal 2: Patient will increase AROM of R shoulder to WNL for increased ability to perform work related duties. Long Term Goal 3: Patient will increase right shoulder strength to 5/5 for increased independence with yardwork. Long Term Goal 4: Patient will decrease pain to 2/10 during functional activities. Long Term Goal 5: Patient will have min-trace fascial restrictions in R shoulder region.  Problem List Patient Active Problem List   Diagnosis  . Right rotator cuff tear  . Muscle weakness (generalized)  . Pain in joint, shoulder region    End of Session Activity Tolerance: Patient tolerated treatment well General Behavior During Session: [facility] for tasks performed Cognition: [facility] for tasks performed OT Plan of Care OT Home Exercise Plan: Towel slides. Elbow/wrist AROM. Consulted and Agree with Plan of Care: Patient  GO   Laverta Baltimore, OTS Occupational Therapy Student  01/28/2012, 4:00 PM  Physician Documentation Your signature is required to indicate approval of the treatment plan as stated above.  Please sign and either send electronically or make a copy of this report for your files and return this physician signed original.  Please mark one 1.__approve of plan  2. ___approve of plan with the following conditions.   ______________________________                                                          _____________________ Physician Signature  Date  

## 2012-01-28 NOTE — Evaluation (Signed)
Note reviewed by clinical instructor and accurately reflects treatment session.  Lajean Boese H. Peytin Dechert, OTR/L  

## 2012-01-30 ENCOUNTER — Ambulatory Visit (HOSPITAL_COMMUNITY)
Admission: RE | Admit: 2012-01-30 | Discharge: 2012-01-30 | Disposition: A | Discharge status: Home / Self Care | Payer: Worker's Compensation | Source: Ambulatory Visit | Attending: Family Medicine | Admitting: Family Medicine

## 2012-01-30 NOTE — Progress Notes (Signed)
Note reviewed by clinical instructor and accurately reflects treatment session.  Bethany H. Murray, OTR/L  

## 2012-01-30 NOTE — Progress Notes (Signed)
Occupational Therapy Treatment Patient Details  Name: Taylor Bean MRN: [id number] Date of Birth: 14-Oct-1940  Today's Date: 01/30/2012 Time: 4098-1191 OT Time Calculation (min): 58 min  Manual Therapy: 1035-1100 25' Therapeutic Exercise: 1100-1118 18' Estim: 4782-9562 15' Visit#: 3  of 24   Re-eval: 02/18/12    Subjective Symptoms/Limitations Symptoms: S: I think I may we overdoing my HEP, its more sore than it every has been.  Pertinent History: Educated patient on grading HEP by lowering reps or not stretching as far.  Precautions/Restrictions   None. Per Dr. Shelba Bean protocol, at 11 weeks post surgery therapy for Taylor Bean can progress as tolerated. [facility] to [facility] for further detail.  Exercise/Treatments Supine Protraction: PROM;10 reps Horizontal ABduction: PROM;10 reps External Rotation: PROM;10 reps Internal Rotation: PROM;10 reps Flexion: PROM;10 reps ABduction: PROM;10 reps Seated Elevation: AROM;10 reps Extension: AROM;10 reps Row: AROM;10 reps Therapy Ball Flexion: 10 reps ABduction: 10 reps ROM / Strengthening / Isometric Strengthening   Flexion: Other (comment) (begin all isometrics next visit)       Modalities Modalities: Electrical Stimulation Manual Therapy Manual Therapy: Myofascial release Myofascial Release: MFR and manual stretching to right scapular, trapezius, shoulder and upper arm regions. Max fascial restrictions noted in all regions, more in upper arm which is where patient reports the most pain.  Pharmacologist Location: IFES to right shoulder Electrical Stimulation Action: decrease tightness and pain Electrical Stimulation Parameters: 15' sweeping started at 14.5 increased to 16.0 Electrical Stimulation Goals: Pain  Occupational Therapy Assessment and Plan OT Assessment and Plan Clinical Impression Statement: A: Pt only able to tolerate very light/gentle MFR of the anterior  shoulder and upper arm. Held isometric strengthening today due to patient pain. Started e-stim which pt reported alleviated some tightness and tension. OT Plan: P: Improve PROM. Begin isometric strengthening if patient's pain level has improved. Continue estim. Increase reps of therapy ball.   Goals Short Term Goals Time to Complete Short Term Goals: 4 weeks Short Term Goal 1: Patient will be educated on HEP. Short Term Goal 2: Patient's R shoulder AROM will be assessed. Short Term Goal 3: Patient will increase PROM to WNL to increase ease with bathing and dressing. Short Term Goal 4: Patient will increase right shoulder strength to 3+/5 for increased ability to lift pots and pans to the stove. Short Term Goal 5: Patient will decrease pain to 4/10 when getting in and out of bed. Additional Short Term Goals?: Yes Short Term Goal 6: Patient will decrease fascial restrictions from maximal to moderate in R shoulder region. Long Term Goals Time to Complete Long Term Goals: 8 weeks Long Term Goal 1: Patient will return to PLOF with all B/IADL, work and leisure activities. Long Term Goal 2: Patient will increase AROM of R shoulder to WNL for increased ability to perform work related duties. Long Term Goal 3: Patient will increase right shoulder strength to 5/5 for increased independence with yardwork. Long Term Goal 4: Patient will decrease pain to 2/10 during functional activities. Long Term Goal 5: Patient will have min-trace fascial restrictions in R shoulder region.  Problem List Patient Active Problem List  Diagnosis  . Right rotator cuff tear  . Muscle weakness (generalized)  . Pain in joint, shoulder region    End of Session Activity Tolerance: Patient tolerated treatment well General Behavior During Session: [facility] for tasks performed Cognition: [facility] for tasks performed  GO   Taylor Bean, OTS Occupational Therapy Student  01/30/2012, 12:02 PM

## 2012-02-03 ENCOUNTER — Ambulatory Visit (HOSPITAL_COMMUNITY)
Admission: RE | Admit: 2012-02-03 | Discharge: 2012-02-03 | Disposition: A | Discharge status: Home / Self Care | Payer: Worker's Compensation | Source: Ambulatory Visit | Attending: Family Medicine | Admitting: Family Medicine

## 2012-02-03 NOTE — Progress Notes (Signed)
Occupational Therapy Treatment Patient Details  Name: Taylor Bean MRN: [id number] Date of Birth: 1940/08/10  Today's Date: 02/03/2012 Time: 1191-4782 OT Time Calculation (min): 53 min  Manual Therapy: 9562-1308 22' Therapeutic Exercise: 6578-4696 16' Estim: 2952-8413 15' Visit#: 4  of 24   Re-eval: 02/18/12    Subjective Symptoms/Limitations Symptoms: S: I like the electrical machine. It made my shoulder feel better for about 15 minutes last time. Pain Assessment Currently in Pain?: Yes Pain Score:   2 Pain Location: Shoulder Pain Orientation: Right Pain Type: Acute pain  Precautions/Restrictions   Per Dr. Shelba Flake protocol, at 11 weeks post surgery therapy for [name] can progress as tolerated. [facility] to [facility] protocol for further detail.   Exercise/Treatments Supine Protraction: PROM;10 reps Horizontal ABduction: PROM;10 reps External Rotation: PROM;10 reps Internal Rotation: PROM;10 reps Flexion: PROM;10 reps ABduction: PROM;10 reps Other Supine Exercises: bridges x15 Seated Elevation: AROM;12 reps Extension: AROM;12 reps Row: AROM;12 reps   Therapy Ball Flexion: 15 reps ABduction: 15 reps ROM / Strengthening / Isometric Strengthening   Flexion: 3X3";Supine Extension: 3X3";Supine External Rotation: 3X3";Supine Internal Rotation: 3X3";Supine ABduction: 3X3";Supine ADduction: 3X3";Supine      Modalities Modalities: Electrical Stimulation Manual Therapy Manual Therapy: Myofascial release Myofascial Release: MFR and manual stretching to right scapular, trapezius, shoulder and upper arm regions. Improvement in fascial restrictions and tolerance of MFR noted. Pharmacologist Location: IFES to right shoulder Electrical Stimulation Action: hi/low sweep to decrease tightness and pain Electrical Stimulation Parameters: 15' sweeping starting at 16.5 Electrical Stimulation Goals: Pain  Occupational Therapy  Assessment and Plan OT Assessment and Plan Clinical Impression Statement: A: Patient more tolerable of MFR this visit. The most restrictions noted in anterior shoulder and upper arm region. Completed isometrics today which patient tolerated well. OT Plan: P: Increase reps of isometrics, scapular AROM seated and therapy ball. Add elbow/wrist strengthening with 1#.  Goals Short Term Goals Time to Complete Short Term Goals: 4 weeks Short Term Goal 1: Patient will be educated on HEP. Short Term Goal 2: Patient's R shoulder AROM will be assessed. Short Term Goal 3: Patient will increase PROM to WNL to increase ease with bathing and dressing. Short Term Goal 4: Patient will increase right shoulder strength to 3+/5 for increased ability to lift pots and pans to the stove. Short Term Goal 5: Patient will decrease pain to 4/10 when getting in and out of bed. Additional Short Term Goals?: Yes Short Term Goal 6: Patient will decrease fascial restrictions from maximal to moderate in R shoulder region. Long Term Goals Time to Complete Long Term Goals: 8 weeks Long Term Goal 1: Patient will return to PLOF with all B/IADL, work and leisure activities. Long Term Goal 2: Patient will increase AROM of R shoulder to WNL for increased ability to perform work related duties. Long Term Goal 3: Patient will increase right shoulder strength to 5/5 for increased independence with yardwork. Long Term Goal 4: Patient will decrease pain to 2/10 during functional activities. Long Term Goal 5: Patient will have min-trace fascial restrictions in R shoulder region.  Problem List Patient Active Problem List  Diagnosis  . Right rotator cuff tear  . Muscle weakness (generalized)  . Pain in joint, shoulder region    End of Session Activity Tolerance: Patient tolerated treatment well General Behavior During Session: [facility] for tasks performed Cognition: [facility] for tasks performed  GO   Laverta Baltimore, OTS Occupational  Therapy Student  02/03/2012, 12:55 PM

## 2012-02-03 NOTE — Progress Notes (Signed)
Note reviewed by clinical instructor and accurately reflects treatment session.  Adal Sereno H. Kycen Spalla, OTR/L  

## 2012-02-05 ENCOUNTER — Ambulatory Visit (HOSPITAL_COMMUNITY)
Admission: RE | Admit: 2012-02-05 | Discharge: 2012-02-05 | Disposition: A | Discharge status: Home / Self Care | Payer: Worker's Compensation | Source: Ambulatory Visit | Attending: Family Medicine | Admitting: Family Medicine

## 2012-02-05 DIAGNOSIS — M6281 Muscle weakness (generalized): Secondary | ICD-10-CM

## 2012-02-05 DIAGNOSIS — M25519 Pain in unspecified shoulder: Secondary | ICD-10-CM

## 2012-02-05 NOTE — Progress Notes (Signed)
Occupational Therapy Treatment Patient Details  Name: Taylor Bean MRN: [id number] Date of Birth: [birth year]  Today's Date: 02/05/2012 Time: 0865-7846 OT Time Calculation (min): 62 min  Manual Therapy: 9629-5284 22' Therapeutic Exercise: 1045-1110 25' E-stim: 1110-1125 15' Visit#: 5  of 24   Re-eval: 02/18/12    Subjective Symptoms/Limitations Symptoms: S: Its sore, but tolerable. Pain Assessment Currently in Pain?: Yes Pain Score:   2 Pain Location: Shoulder Pain Orientation: Right Pain Type: Acute pain  Precautions/Restrictions   Progress as tolerated. AAROM once full PROM.  Exercise/Treatments Supine Protraction: PROM;10 reps Horizontal ABduction: PROM;10 reps External Rotation: PROM;10 reps Internal Rotation: PROM;10 reps Flexion: PROM;10 reps ABduction: PROM;10 reps Other Supine Exercises: bridges x15 Seated Elevation: AROM;15 reps Extension: AROM;15 reps Row: AROM;15 reps   Therapy Ball Flexion: 20 reps ABduction: 20 reps ROM / Strengthening / Isometric Strengthening   Flexion: Supine;3X5" Extension: Supine;3X5" External Rotation: Supine;3X5" Internal Rotation: Supine;3X5" ABduction: Supine;3X5" ADduction: Supine;3X5"      Modalities Modalities: Electrical Stimulation Manual Therapy Manual Therapy: Myofascial release Myofascial Release: MFR and manual stretching to right scapular, trapezius, anterior shoulder and upper arm regions to decrease pain and restrictions and increase pain free mobility. Pharmacologist Location: IFES to right shoulder Electrical Stimulation Action: hi/low sweep to decrease tightness and pain Electrical Stimulation Parameters: 15' sweeping starting at 13.0  Occupational Therapy Assessment and Plan OT Assessment and Plan Clinical Impression Statement: A: Increase in PROM and more tolerable of MFR. Patient reports the most pain and tigthness in anterior shoulder and upper arm regions.  Increased reps of isometric strengthening, scapular AROM and ball stretches. OT Plan: P: Improve PROM. Increase hold duration of isometrics and reps of therapy ball stretch.   Goals Short Term Goals Time to Complete Short Term Goals: 4 weeks Short Term Goal 1: Patient will be educated on HEP. Short Term Goal 2: Patient's R shoulder AROM will be assessed. Short Term Goal 3: Patient will increase PROM to WNL to increase ease with bathing and dressing. Short Term Goal 4: Patient will increase right shoulder strength to 3+/5 for increased ability to lift pots and pans to the stove. Short Term Goal 5: Patient will decrease pain to 4/10 when getting in and out of bed. Additional Short Term Goals?: Yes Short Term Goal 6: Patient will decrease fascial restrictions from maximal to moderate in R shoulder region. Long Term Goals Time to Complete Long Term Goals: 8 weeks Long Term Goal 1: Patient will return to PLOF with all B/IADL, work and leisure activities. Long Term Goal 2: Patient will increase AROM of R shoulder to WNL for increased ability to perform work related duties. Long Term Goal 3: Patient will increase right shoulder strength to 5/5 for increased independence with yardwork. Long Term Goal 4: Patient will decrease pain to 2/10 during functional activities. Long Term Goal 5: Patient will have min-trace fascial restrictions in R shoulder region.  Problem List Patient Active Problem List  Diagnosis  . Right rotator cuff tear  . Muscle weakness (generalized)  . Pain in joint, shoulder region    End of Session Activity Tolerance: Patient tolerated treatment well General Behavior During Session: [facility] for tasks performed Cognition: [facility] for tasks performed  GO   Laverta Baltimore, OTS Occupational Therapy Student  02/05/2012, 11:57 AM

## 2012-02-05 NOTE — Progress Notes (Signed)
Note reviewed by clinical instructor and accurately reflects treatment session.  Carolan Avedisian H. Dashonda Bonneau, OTR/L  

## 2012-02-06 ENCOUNTER — Ambulatory Visit (HOSPITAL_COMMUNITY)
Admission: RE | Admit: 2012-02-06 | Discharge: 2012-02-06 | Disposition: A | Discharge status: Home / Self Care | Payer: Worker's Compensation | Source: Ambulatory Visit | Attending: Family Medicine | Admitting: Family Medicine

## 2012-02-06 DIAGNOSIS — M25519 Pain in unspecified shoulder: Secondary | ICD-10-CM

## 2012-02-06 DIAGNOSIS — M6281 Muscle weakness (generalized): Secondary | ICD-10-CM

## 2012-02-06 NOTE — Progress Notes (Signed)
Note reviewed by clinical instructor and accurately reflects treatment session.  Bethany H. Murray, OTR/L  

## 2012-02-06 NOTE — Progress Notes (Signed)
Occupational Therapy Treatment Patient Details  Name: DARTANIAN KNAGGS MRN: [id number] Date of Birth: [birth year]  Today's Date: 02/06/2012 Time: 6578-4696 OT Time Calculation (min): 60 min  Manual Therapy: 2952-8413 25' Therapeutic Exercise: 2440-1027 20' IFES/heat: 2536-6440 15' Visit#: 6  of 24   Re-eval: 02/18/12    Subjective Symptoms/Limitations Symptoms: S: I hate to be a complainer, but its been sore. Pain Assessment Currently in Pain?: Yes Pain Score:   3 Pain Location: Shoulder Pain Orientation: Right Pain Type: Acute pain  Precautions/Restrictions   *NOTE* Mr. Qin surgery date was 12/13/11 (previously noted as 11/08/11). He is now 8 weeks s/p surgery and can progress as tolerated. Add AAROM once PROM improves.  Exercise/Treatments Supine Protraction: PROM;10 reps Horizontal ABduction: PROM;10 reps External Rotation: PROM;10 reps Internal Rotation: PROM;10 reps Flexion: PROM;10 reps ABduction: PROM;10 reps Other Supine Exercises: elbow flexion/extension, forearm pronation/supination, wrist flexion/extension 10 reps with 1# Other Supine Exercises: bridges x15 Seated Elevation: AROM;15 reps Extension: AROM;15 reps Row: AROM;15 reps     Therapy Ball Flexion: 20 reps ABduction: 20 reps ROM / Strengthening / Isometric Strengthening   Flexion: Supine;5X5" Extension: Supine;5X5" External Rotation: Supine;5X5" Internal Rotation: Supine;5X5" ABduction: Supine;5X5" ADduction: Supine;5X5"      Modalities Modalities: Electrical Stimulation;Moist Heat Manual Therapy Manual Therapy: Myofascial release Myofascial Release: MFR and manual stretching to right scapular, trapezius, anterior shoulder and upper arm regions to decrease pain and restrictions and increase pain free mobility. Moist Heat Therapy Number Minutes Moist Heat: 15 Minutes Moist Heat Location: Shoulder Electrical Stimulation Electrical Stimulation Location: IFES to right shoulder Electrical  Stimulation Action: hi/low sweep to decrease tightness and pain Electrical Stimulation Parameters: 15' sweeping starting at 10.0 Electrical Stimulation Goals: Pain  Occupational Therapy Assessment and Plan OT Assessment and Plan Clinical Impression Statement: S: Improvements in restrictions and tolerance of MFR noted. Increased reps of isometric strengthening and added elbow flex/ext, forearm sup/pron, wrist flex/ext with 1# weight. OT Plan: P: Improve PROM. Increase reps of bridges and therapy ball stretch.   Goals Short Term Goals Time to Complete Short Term Goals: 4 weeks Short Term Goal 1: Patient will be educated on HEP. Short Term Goal 1 Progress: Progressing toward goal Short Term Goal 2: Patient's R shoulder AROM will be assessed. Short Term Goal 2 Progress: Progressing toward goal Short Term Goal 3: Patient will increase PROM to WNL to increase ease with bathing and dressing. Short Term Goal 3 Progress: Progressing toward goal Short Term Goal 4: Patient will increase right shoulder strength to 3+/5 for increased ability to lift pots and pans to the stove. Short Term Goal 4 Progress: Progressing toward goal Short Term Goal 5: Patient will decrease pain to 4/10 when getting in and out of bed. Short Term Goal 5 Progress: Progressing toward goal Additional Short Term Goals?: Yes Short Term Goal 6: Patient will decrease fascial restrictions from maximal to moderate in R shoulder region. Short Term Goal 6 Progress: Progressing toward goal Long Term Goals Time to Complete Long Term Goals: 8 weeks Long Term Goal 1: Patient will return to PLOF with all B/IADL, work and leisure activities. Long Term Goal 1 Progress: Progressing toward goal Long Term Goal 2: Patient will increase AROM of R shoulder to WNL for increased ability to perform work related duties. Long Term Goal 2 Progress: Progressing toward goal Long Term Goal 3: Patient will increase right shoulder strength to 5/5 for  increased independence with yardwork. Long Term Goal 3 Progress: Progressing toward goal Long Term Goal 4: Patient  will decrease pain to 2/10 during functional activities. Long Term Goal 4 Progress: Progressing toward goal Long Term Goal 5: Patient will have min-trace fascial restrictions in R shoulder region. Long Term Goal 5 Progress: Progressing toward goal  Problem List Patient Active Problem List  Diagnosis  . Right rotator cuff tear  . Muscle weakness (generalized)  . Pain in joint, shoulder region    End of Session Activity Tolerance: Patient tolerated treatment well General Behavior During Session: [facility] for tasks performed Cognition: [facility] for tasks performed  GO   Laverta Baltimore, OTS Occupational Therapy Student  02/06/2012, 11:23 AM

## 2012-02-10 ENCOUNTER — Ambulatory Visit (HOSPITAL_COMMUNITY)
Admission: RE | Admit: 2012-02-10 | Discharge: 2012-02-10 | Disposition: A | Discharge status: Home / Self Care | Payer: Worker's Compensation | Source: Ambulatory Visit | Attending: Orthopedic Surgery | Admitting: Orthopedic Surgery

## 2012-02-10 DIAGNOSIS — M6281 Muscle weakness (generalized): Secondary | ICD-10-CM

## 2012-02-10 DIAGNOSIS — M25519 Pain in unspecified shoulder: Secondary | ICD-10-CM

## 2012-02-10 NOTE — Progress Notes (Signed)
Occupational Therapy Treatment Patient Details  Name: DAYMON HORA MRN: [id number] Date of Birth: 1941-04-19  Today's Date: 02/10/2012 Time: 9147-8295 OT Time Calculation (min): 35 min Manual Therapy 403-426 23' Therapeutic Exercise 427-438 11'  Visit#: 7  of 24   Re-eval: 02/18/12    Subjective Symptoms/Limitations Symptoms: S:  I have been sore all weekend. Pain Assessment Currently in Pain?: Yes Pain Score:   7 Pain Location: Shoulder Pain Orientation: Right Pain Type: Acute pain Pain Frequency: Intermittent  Precautions/Restrictions     Exercise/Treatments Supine Protraction: PROM;10 reps Horizontal ABduction: PROM;10 reps External Rotation: PROM;10 reps Internal Rotation: PROM;10 reps Flexion: PROM;10 reps ABduction: PROM;10 reps Other Supine Exercises: elbow flexion/extension, forearm pronation/supination, wrist flexion/extension 10 reps with 2# Other Supine Exercises: bridges x15 Seated Elevation: AROM;15 reps Extension: AROM;15 reps Row: AROM;15 reps Therapy Ball Flexion: 20 reps ABduction: 20 reps ROM / Strengthening / Isometric Strengthening   Flexion: Supine;5X5" Extension: Supine;5X5" External Rotation: Supine;5X5" Internal Rotation: Supine;5X5" ABduction: Supine;5X5" ADduction: Supine;5X5"        Manual Therapy Manual Therapy: Myofascial release Myofascial Release: MFR and manual stretching to right scapular, trapezius, anterior shoulder and upper arm regions to decrease pain and restrictions and increase pain free mobility  Occupational Therapy Assessment and Plan OT Assessment and Plan Clinical Impression Statement: A:  Increased weight to 2# with elbow fllexion and pro/sup. and wrist flex/ext. OT Plan: P:  Focus on increaseing PROM.   Goals Short Term Goals Time to Complete Short Term Goals: 4 weeks Short Term Goal 1: Patient will be educated on HEP. Short Term Goal 2: Patient's R shoulder AROM will be assessed. Short Term Goal 3:  Patient will increase PROM to WNL to increase ease with bathing and dressing. Short Term Goal 4: Patient will increase right shoulder strength to 3+/5 for increased ability to lift pots and pans to the stove. Short Term Goal 5: Patient will decrease pain to 4/10 when getting in and out of bed. Additional Short Term Goals?: Yes Short Term Goal 6: Patient will decrease fascial restrictions from maximal to moderate in R shoulder region. Long Term Goals Time to Complete Long Term Goals: 8 weeks Long Term Goal 1: Patient will return to PLOF with all B/IADL, work and leisure activities. Long Term Goal 2: Patient will increase AROM of R shoulder to WNL for increased ability to perform work related duties. Long Term Goal 3: Patient will increase right shoulder strength to 5/5 for increased independence with yardwork. Long Term Goal 4: Patient will decrease pain to 2/10 during functional activities. Long Term Goal 5: Patient will have min-trace fascial restrictions in R shoulder region.  Problem List Patient Active Problem List  Diagnosis  . Right rotator cuff tear  . Muscle weakness (generalized)  . Pain in joint, shoulder region    End of Session Activity Tolerance: Patient tolerated treatment well General Behavior During Session: [facility] for tasks performed Cognition: [facility] for tasks performed  GO    [name], Kiela Shisler L 02/10/2012, 4:42 PM

## 2012-02-12 ENCOUNTER — Ambulatory Visit (HOSPITAL_COMMUNITY)
Admission: RE | Admit: 2012-02-12 | Discharge: 2012-02-12 | Disposition: A | Discharge status: Home / Self Care | Payer: Worker's Compensation | Source: Ambulatory Visit | Attending: Orthopedic Surgery | Admitting: Orthopedic Surgery

## 2012-02-12 DIAGNOSIS — M25519 Pain in unspecified shoulder: Secondary | ICD-10-CM

## 2012-02-12 DIAGNOSIS — M6281 Muscle weakness (generalized): Secondary | ICD-10-CM

## 2012-02-12 NOTE — Progress Notes (Signed)
Occupational Therapy Treatment Patient Details  Name: Taylor Bean MRN: [id number] Date of Birth: 1941/03/02  Today's Date: 02/12/2012 Time: 4098-1191 OT Time Calculation (min): 35 min Manual Therapy 230-255 25' Therapeutic Exercise 256-305 9'  Visit#: 8  of 24   Re-eval: 02/18/12 Assessment Diagnosis: s/p R RCR Surgical Date: 11/08/11 Next MD Visit: 4 weeks Prior Therapy: None   Subjective Symptoms/Limitations Symptoms: S:  I am tired of watching TV about to drive me crazy, I need to go back to work. Pain Assessment Currently in Pain?: Yes (just some pain with movement)  Precautions/Restrictions  Precautions Precautions: None  Exercise/Treatments Supine Protraction: PROM;10 reps Horizontal ABduction: PROM;10 reps External Rotation: PROM;10 reps Internal Rotation: PROM;10 reps Flexion: PROM;10 reps ABduction: PROM;10 reps Other Supine Exercises: elbow flexion/extension, forearm pronation/supination, wrist flexion/extension 10 reps with 2# Other Supine Exercises: bridges x15 Seated Elevation: AROM;15 reps Extension: AROM;15 reps Row: AROM;15 reps Therapy Ball Flexion: 20 reps ABduction: 20 reps ROM / Strengthening / Isometric Strengthening   Flexion: Supine;5X5" Extension: Supine;5X5" External Rotation: Supine;5X5" Internal Rotation: Supine;5X5" ABduction: Supine;5X5" ADduction: Supine;5X5" Manual Therapy Manual Therapy: Myofascial release Myofascial Release: MFR and manual stretching to right scapular, trapezius, anterior shoulder and upper arm regions to decrease pain and restrictions and increase pain free mobility   Occupational Therapy Assessment and Plan OT Assessment and Plan Clinical Impression Statement: A:  Increased tolerance to PROM today. OT Plan: P:  Continue to increase PROM and decrease restrictions as patient tolerates.   Goals Short Term Goals Time to Complete Short Term Goals: 4 weeks Short Term Goal 1: Patient will be educated on  HEP. Short Term Goal 2: Patient's R shoulder AROM will be assessed. Short Term Goal 3: Patient will increase PROM to WNL to increase ease with bathing and dressing. Short Term Goal 4: Patient will increase right shoulder strength to 3+/5 for increased ability to lift pots and pans to the stove. Short Term Goal 5: Patient will decrease pain to 4/10 when getting in and out of bed. Additional Short Term Goals?: Yes Short Term Goal 6: Patient will decrease fascial restrictions from maximal to moderate in R shoulder region. Long Term Goals Time to Complete Long Term Goals: 8 weeks Long Term Goal 1: Patient will return to PLOF with all B/IADL, work and leisure activities. Long Term Goal 2: Patient will increase AROM of R shoulder to WNL for increased ability to perform work related duties. Long Term Goal 3: Patient will increase right shoulder strength to 5/5 for increased independence with yardwork. Long Term Goal 4: Patient will decrease pain to 2/10 during functional activities. Long Term Goal 5: Patient will have min-trace fascial restrictions in R shoulder region.  Problem List Patient Active Problem List  Diagnosis  . Right rotator cuff tear  . Muscle weakness (generalized)  . Pain in joint, shoulder region    End of Session Activity Tolerance: Patient tolerated treatment well General Behavior During Session: [facility] for tasks performed Cognition: [facility] for tasks performed  GO    [name], Camaya Gannett L 02/12/2012, 4:38 PM

## 2012-02-17 ENCOUNTER — Ambulatory Visit (HOSPITAL_COMMUNITY)
Admission: RE | Admit: 2012-02-17 | Discharge: 2012-02-17 | Disposition: A | Discharge status: Home / Self Care | Payer: BC Managed Care – PPO | Source: Ambulatory Visit | Attending: Orthopedic Surgery | Admitting: Orthopedic Surgery

## 2012-02-17 DIAGNOSIS — M6281 Muscle weakness (generalized): Secondary | ICD-10-CM | POA: Insufficient documentation

## 2012-02-17 DIAGNOSIS — M25519 Pain in unspecified shoulder: Secondary | ICD-10-CM | POA: Insufficient documentation

## 2012-02-17 DIAGNOSIS — M25619 Stiffness of unspecified shoulder, not elsewhere classified: Secondary | ICD-10-CM | POA: Insufficient documentation

## 2012-02-17 DIAGNOSIS — IMO0001 Reserved for inherently not codable concepts without codable children: Secondary | ICD-10-CM | POA: Insufficient documentation

## 2012-02-17 NOTE — Progress Notes (Signed)
Occupational Therapy Treatment Patient Details  Name: Taylor Bean MRN: [id number] Date of Birth: 29-Jan-1941  Today's Date: 02/17/2012 Time: 1191-4782 OT Time Calculation (min): 80 min Manual Therapy [phone] 28' Therapeutic Exercises [phone] 49' IFES with heat 15' Visit#: 9  of 24   Re-eval: 02/18/12    Subjective Symptoms/Limitations Symptoms: S:  Im still having alot of tightness and pain throughout my entire shoulder.  The only thing that helps is the sling. Limitations:  *NOTE* Taylor Bean surgery date was 12/13/11 (previously noted as 11/08/11). He is now 8 weeks s/p surgery and can progress as tolerated. Add AAROM once PROM improves. Pain Assessment Currently in Pain?: Yes Pain Score:   5 Pain Location: Shoulder Pain Orientation: Right Pain Type: Acute pain  Precautions/Restrictions    *NOTE* Taylor Bean surgery date was 12/13/11 (previously noted as 11/08/11). He is now 8 weeks s/p surgery and can progress as tolerated. Add AAROM once PROM improves.  Exercise/Treatments Supine Protraction: PROM;AAROM;10 reps Horizontal ABduction: PROM;AAROM;10 reps External Rotation: PROM;AROM;10 reps Internal Rotation: PROM;AROM;10 reps Flexion: PROM;AROM;10 reps ABduction: PROM;AROM;10 reps Other Supine Exercises: dc Other Supine Exercises:  (dc) Seated Elevation: AROM;15 reps Extension: AROM;15 reps Row: AROM;15 reps Pulleys Flexion: 2 minutes ABduction: 2 minutes Therapy Ball Flexion: 20 reps ABduction: 20 reps ROM / Strengthening / Isometric Strengthening   Flexion: Other (comment) (dc all isometric strengthening)    Manual Therapy Manual Therapy: Myofascial release Myofascial Release: MFR and manual stretching to right scapular, trapezius, anterior shoulder and upper arm regions to decrease pain and restrictions and increase pain free mobility [phone] Moist Heat Therapy Number Minutes Moist Heat: 15 Minutes Moist Heat Location: Shoulder Electrical Stimulation Electrical  Stimulation Location: IFES to right shoulder Electrical Stimulation Action: hi/low sweep to decrease tightness and pain  Electrical Stimulation Parameters: 15' sweeping starting at 10.0 Electrical Stimulation Goals: Pain  Occupational Therapy Assessment and Plan OT Assessment and Plan Clinical Impression Statement: A:  Patient very guarded with PROM despite soft end feel.  Added AAROM today to see if patient being in control of movement would be helpful to gain range.  He did have increased AAROM in comparison to PROM, therefore completed 10 reps of AAROM in supine and pullies.   OT Plan: P:  Increase AAROM and PROM by 5 degrees and decrease pain with PROM to 4/10 or better.  REASSESS.   Goals Short Term Goals Time to Complete Short Term Goals: 4 weeks Short Term Goal 1: Patient will be educated on HEP. Short Term Goal 1 Progress: Progressing toward goal Short Term Goal 2: Patient's R shoulder AROM will be assessed. Short Term Goal 2 Progress: Progressing toward goal Short Term Goal 3: Patient will increase PROM to WNL to increase ease with bathing and dressing. Short Term Goal 3 Progress: Progressing toward goal Short Term Goal 4: Patient will increase right shoulder strength to 3+/5 for increased ability to lift pots and pans to the stove. Short Term Goal 4 Progress: Progressing toward goal Short Term Goal 5: Patient will decrease pain to 4/10 when getting in and out of bed. Short Term Goal 5 Progress: Progressing toward goal Additional Short Term Goals?: Yes Short Term Goal 6: Patient will decrease fascial restrictions from maximal to moderate in R shoulder region. Short Term Goal 6 Progress: Progressing toward goal Long Term Goals Time to Complete Long Term Goals: 8 weeks Long Term Goal 1: Patient will return to PLOF with all B/IADL, work and leisure activities. Long Term Goal 1 Progress: Progressing toward  goal Long Term Goal 2: Patient will increase AROM of R shoulder to WNL for  increased ability to perform work related duties. Long Term Goal 2 Progress: Progressing toward goal Long Term Goal 3: Patient will increase right shoulder strength to 5/5 for increased independence with yardwork. Long Term Goal 3 Progress: Progressing toward goal Long Term Goal 4: Patient will decrease pain to 2/10 during functional activities. Long Term Goal 4 Progress: Progressing toward goal Long Term Goal 5: Patient will have min-trace fascial restrictions in R shoulder region. Long Term Goal 5 Progress: Progressing toward goal  Problem List Patient Active Problem List  Diagnosis  . Right rotator cuff tear  . Muscle weakness (generalized)  . Pain in joint, shoulder region    End of Session Activity Tolerance: Patient tolerated treatment well General Behavior During Session: [facility] for tasks performed  GO    Shirlean Mylar, OTR/L  02/17/2012, 10:20 AM

## 2012-02-19 ENCOUNTER — Ambulatory Visit (HOSPITAL_COMMUNITY)
Admission: RE | Admit: 2012-02-19 | Discharge: 2012-02-19 | Disposition: A | Discharge status: Home / Self Care | Payer: BC Managed Care – PPO | Source: Ambulatory Visit | Attending: Family Medicine | Admitting: Family Medicine

## 2012-02-19 DIAGNOSIS — M6281 Muscle weakness (generalized): Secondary | ICD-10-CM

## 2012-02-19 DIAGNOSIS — M25519 Pain in unspecified shoulder: Secondary | ICD-10-CM

## 2012-02-19 NOTE — Progress Notes (Signed)
Occupational Therapy Treatment Patient Details  Name: Taylor Bean MRN: [id number] Date of Birth: [birth year]  Today's Date: 02/19/2012 Time: 4098-1191 OT Time Calculation (min): 82 min Manual Therapy [phone] 26' Therapeutic Exercises [phone] 4' IFES with heat [phone] 15' Visit#: 10  of 24   Re-eval: 03/18/12    Authorization: workers compensation   Subjective S:  I can reach to about shoulder height now. Limitations: NOTE* Taylor Bean surgery date was 12/13/11 (previously noted as 11/08/11). He is now 8 weeks s/p surgery and can progress as tolerated. Add AAROM once PROM improves. Special Tests: UEFI 35/80 = 43% independence level Pain Assessment Currently in Pain?: Yes Pain Score:   5 Pain Location: Shoulder Pain Orientation: Right Pain Type: Acute pain  Precautions/Restrictions   may progress as tolerated,   Exercise/Treatments Supine Protraction: PROM;AAROM;10 reps Horizontal ABduction: PROM;AAROM;10 reps External Rotation: PROM;AROM;10 reps Internal Rotation: PROM;AROM;10 reps Flexion: PROM;AROM;10 reps ABduction: PROM;AROM;10 reps Seated Elevation: AROM;15 reps Extension: AROM;15 reps Row: AROM;15 reps Pulleys Flexion:  (2.5 minutes) ABduction:  (2.5 minutes) Therapy Ball Flexion: 20 reps ABduction: 20 reps       Manual Therapy Manual Therapy: Myofascial release Myofascial Release: MFR and manual stretching to right scapular, trapezius, anterior shoulder, and upper arm regions to decrease pain and restrictions and increase pain free mobility  [phone] less restrictions noted in upper arm and shoulder region this date. Moist Heat Therapy Number Minutes Moist Heat: 15 Minutes Moist Heat Location: Shoulder;Other (comment) (in combination with IFES, no charge for heat) Pharmacologist Location: IFES to right shoulder Electrical Stimulation Action: hi/low sweep to decrease tightness and pain  Electrical Stimulation Parameters: 15'  sweeping starting at 13.5 Electrical Stimulation Goals: Pain  Occupational Therapy Assessment and Plan OT Assessment and Plan Clinical Impression Statement: A:  Reassessment completed this date.  Flexion 105/128, abduction 70/118, external rotation with shoulder adducted 15/24, internal rotation with shoulder adducted 85.  UEFI improved to 43%.  Less restrictions noted in shoulder with MFR this date. OT Plan: P:  Increase to 12 reps with AAROM in supine, add thumb tacks and prot/ret//elev/dep.   Goals Short Term Goals Time to Complete Short Term Goals: 4 weeks Short Term Goal 1: Patient will be educated on HEP. Short Term Goal 1 Progress: Met Short Term Goal 2: Patient's R shoulder AROM will be assessed. Short Term Goal 3: Patient will increase PROM to WNL to increase ease with bathing and dressing. Short Term Goal 4: Patient will increase right shoulder strength to 3+/5 for increased ability to lift pots and pans to the stove. Short Term Goal 5: Patient will decrease pain to 4/10 when getting in and out of bed. Additional Short Term Goals?: Yes Short Term Goal 6: Patient will decrease fascial restrictions from maximal to moderate in R shoulder region. Long Term Goals Time to Complete Long Term Goals: 8 weeks Long Term Goal 1: Patient will return to PLOF with all B/IADL, work and leisure activities. Long Term Goal 2: Patient will increase AROM of R shoulder to WNL for increased ability to perform work related duties. Long Term Goal 3: Patient will increase right shoulder strength to 5/5 for increased independence with yardwork. Long Term Goal 4: Patient will decrease pain to 2/10 during functional activities. Long Term Goal 5: Patient will have min-trace fascial restrictions in R shoulder region.  Problem List Patient Active Problem List  Diagnosis  . Right rotator cuff tear  . Muscle weakness (generalized)  . Pain in joint, shoulder region  End of Session Activity Tolerance:  Patient tolerated treatment well General Behavior During Session: Taylor Bean for tasks performed Cognition: Taylor Bean for tasks performed  Shirlean Mylar, OTR/L  02/19/2012, 10:50 AM

## 2012-02-20 ENCOUNTER — Ambulatory Visit (HOSPITAL_COMMUNITY)
Admission: RE | Admit: 2012-02-20 | Discharge: 2012-02-20 | Disposition: A | Discharge status: Home / Self Care | Payer: BC Managed Care – PPO | Source: Ambulatory Visit | Attending: Family Medicine | Admitting: Family Medicine

## 2012-02-20 DIAGNOSIS — M6281 Muscle weakness (generalized): Secondary | ICD-10-CM

## 2012-02-20 DIAGNOSIS — M25519 Pain in unspecified shoulder: Secondary | ICD-10-CM

## 2012-02-20 NOTE — Progress Notes (Signed)
Occupational Therapy Treatment Patient Details  Name: Taylor Bean MRN: [id number] Date of Birth: 06-23-1941  Today's Date: 02/20/2012 Time: 4098-1191 OT Time Calculation (min): 56 min Manual Therapy [phone] 24' Therapeutic Exercises [phone] 25' Visit#: 11  of 24   Re-eval: 03/18/12    Authorization: workers compensation    Subjective Symptoms/Limitations Symptoms: The doctor was pleased with my progress.  He said the surgery was very delicate. Limitations: NOTE* Taylor Bean surgery date was 12/13/11 (previously noted as 11/08/11). He is now 8 weeks s/p surgery and can progress as tolerated. Add AAROM once PROM improves. Pain Assessment Currently in Pain?: Yes Pain Score:   2 Pain Location: Shoulder Pain Orientation: Right Pain Type: Acute pain  Precautions/Restrictions   Taylor Bean surgery date was 12/13/11 (previously noted as 11/08/11). He is now 8 weeks s/p surgery and can progress as tolerated. Add AAROM once PROM improves.   Exercise/Treatments Supine Protraction: PROM;10 reps;AAROM;15 reps Horizontal ABduction: PROM;10 reps;AAROM;15 reps External Rotation: PROM;10 reps;AAROM;15 reps Internal Rotation: PROM;10 reps;AAROM;15 reps Flexion: PROM;10 reps;AAROM;15 reps ABduction: PROM;10 reps;AAROM;15 reps Seated Elevation: AROM;15 reps Extension: AROM;15 reps Row: AROM;15 reps Protraction: AAROM;10 reps Horizontal ABduction: AAROM;10 reps External Rotation: AAROM;10 reps Internal Rotation: AAROM;10 reps Pulleys Flexion: 3 minutes ABduction: 3 minutes Therapy Ball Flexion: 25 reps ABduction: 25 reps ROM / Strengthening / Isometric Strengthening Prot/Ret//Elev/Dep: 1'      Manual Therapy Manual Therapy: Myofascial release Myofascial Release: MFR and manual stretching to right scapular, trapezius, anterior shoulder, and upper arm regions to decrease pain and restrictions and increase pain free mobility [phone] less restrictions noted in upper arm and shoulder region  this date.  [phone] Pharmacologist Location: discharge IFES this date, Taylor Bean is not feeling results as he was when initially utilized.  Occupational Therapy Assessment and Plan OT Assessment and Plan Clinical Impression Statement: A:  Added AAROM in seated for protraction, horizontal abduction, external rotation, internal rotation as AAROM in supine is WFL.  Added prot/ret//elev/dep. OT Plan: P:  Attempt thumb tacks, increase AAROM abduction to [facility].     Goals Short Term Goals Time to Complete Short Term Goals: 4 weeks Short Term Goal 1: Patient will be educated on HEP. Short Term Goal 2: Patient's R shoulder AROM will be assessed. Short Term Goal 3: Patient will increase PROM to WNL to increase ease with bathing and dressing. Short Term Goal 4: Patient will increase right shoulder strength to 3+/5 for increased ability to lift pots and pans to the stove. Short Term Goal 5: Patient will decrease pain to 4/10 when getting in and out of bed. Additional Short Term Goals?: Yes Short Term Goal 6: Patient will decrease fascial restrictions from maximal to moderate in R shoulder region. Long Term Goals Time to Complete Long Term Goals: 8 weeks Long Term Goal 1: Patient will return to PLOF with all B/IADL, work and leisure activities. Long Term Goal 2: Patient will increase AROM of R shoulder to WNL for increased ability to perform work related duties. Long Term Goal 3: Patient will increase right shoulder strength to 5/5 for increased independence with yardwork. Long Term Goal 4: Patient will decrease pain to 2/10 during functional activities. Long Term Goal 5: Patient will have min-trace fascial restrictions in R shoulder region.  Problem List Patient Active Problem List  Diagnosis  . Right rotator cuff tear  . Muscle weakness (generalized)  . Pain in joint, shoulder region    End of Session Activity Tolerance: Patient tolerated treatment  well General Behavior During Session: [facility] for tasks performed Cognition: [facility] for tasks performed OT Plan of Care OT Home Exercise Plan: Added AAROM with dowel rod to HEP.  GO    Shirlean Mylar, OTR/L  02/20/2012, 9:57 AM

## 2012-02-24 ENCOUNTER — Ambulatory Visit (HOSPITAL_COMMUNITY): Payer: Self-pay | Admitting: Specialist

## 2012-02-25 ENCOUNTER — Encounter (HOSPITAL_COMMUNITY): Payer: Self-pay | Admitting: *Deleted

## 2012-02-25 ENCOUNTER — Emergency Department (HOSPITAL_COMMUNITY)
Admission: EM | Admit: 2012-02-25 | Discharge: 2012-02-26 | Disposition: A | Discharge status: Home / Self Care | Payer: BC Managed Care – PPO | Attending: Emergency Medicine | Admitting: Emergency Medicine

## 2012-02-25 DIAGNOSIS — F3289 Other specified depressive episodes: Secondary | ICD-10-CM | POA: Insufficient documentation

## 2012-02-25 DIAGNOSIS — Z79899 Other long term (current) drug therapy: Secondary | ICD-10-CM | POA: Insufficient documentation

## 2012-02-25 DIAGNOSIS — Z87891 Personal history of nicotine dependence: Secondary | ICD-10-CM | POA: Insufficient documentation

## 2012-02-25 DIAGNOSIS — Z7982 Long term (current) use of aspirin: Secondary | ICD-10-CM | POA: Insufficient documentation

## 2012-02-25 DIAGNOSIS — F329 Major depressive disorder, single episode, unspecified: Secondary | ICD-10-CM | POA: Insufficient documentation

## 2012-02-25 DIAGNOSIS — R319 Hematuria, unspecified: Secondary | ICD-10-CM | POA: Insufficient documentation

## 2012-02-25 DIAGNOSIS — G4730 Sleep apnea, unspecified: Secondary | ICD-10-CM | POA: Insufficient documentation

## 2012-02-25 DIAGNOSIS — E785 Hyperlipidemia, unspecified: Secondary | ICD-10-CM | POA: Insufficient documentation

## 2012-02-25 DIAGNOSIS — Z8739 Personal history of other diseases of the musculoskeletal system and connective tissue: Secondary | ICD-10-CM | POA: Insufficient documentation

## 2012-02-25 NOTE — ED Notes (Signed)
Pt reporting bleeding with urination, starting around 6pm.  Denies pain in flank or back.  Pt reports one episode of cold chills.  Reports frequency and urgency of urination.  Pt did void, bright cherry colored blood.

## 2012-02-26 ENCOUNTER — Ambulatory Visit (HOSPITAL_COMMUNITY): Payer: Self-pay | Admitting: Specialist

## 2012-02-26 ENCOUNTER — Emergency Department (HOSPITAL_COMMUNITY): Payer: BC Managed Care – PPO

## 2012-02-26 ENCOUNTER — Inpatient Hospital Stay (HOSPITAL_COMMUNITY): Admission: RE | Admit: 2012-02-26 | Payer: Self-pay | Source: Ambulatory Visit | Admitting: Specialist

## 2012-02-26 LAB — URINALYSIS, ROUTINE W REFLEX MICROSCOPIC
Bilirubin Urine: NEGATIVE
Glucose, UA: 100 mg/dL — AB
Specific Gravity, Urine: 1.025 (ref 1.005–1.030)

## 2012-02-26 LAB — URINE MICROSCOPIC-ADD ON

## 2012-02-26 MED ORDER — ACETAMINOPHEN 325 MG PO TABS
650.0000 mg | ORAL_TABLET | Freq: Once | ORAL | Status: AC
  Administered 2012-02-26: 650 mg via ORAL
  Filled 2012-02-26: qty 2

## 2012-02-26 MED ORDER — CIPROFLOXACIN HCL 500 MG PO TABS: 500.0000 mg | ORAL_TABLET | Freq: Two times a day (BID) | ORAL | Status: AC

## 2012-02-26 MED ORDER — CIPROFLOXACIN HCL 250 MG PO TABS
500.0000 mg | ORAL_TABLET | Freq: Once | ORAL | Status: AC
  Administered 2012-02-26: 500 mg via ORAL
  Filled 2012-02-26: qty 2

## 2012-02-26 NOTE — ED Notes (Addendum)
Patient requested medication for headache - generalized headache - usually takes tylenol for relief.  Note:  Frequency of urination - states now stream or duration of voiding is increasing and urine is lighter in color

## 2012-02-26 NOTE — ED Provider Notes (Signed)
History     CSN: [id number]  Arrival date & time 02/25/12  2315   First MD Initiated Contact with Patient 02/25/12 2344      Chief Complaint  Patient presents with  . Hematuria    (Consider location/radiation/quality/duration/timing/severity/associated sxs/prior treatment) HPI  Taylor Bean is a 71 y.o. male who presents to the Emergency Department complaining of hematuria that began at 6 PM and has become more frequent with mild tenderness that has developed over the left flank. Denies fever, chills, nausea, vomiting. Remote history of a kidney stone.  PCP Dr. Regino Schultze Urology Dr. Letha Cape  Past Medical History  Diagnosis Date  . Hyperlipidemia   . Sleep apnea     uses a cpap-will bring dos  . Arthritis     general  . Depression   . Right rotator cuff tear 12/13/2011    Past Surgical History  Procedure Date  . 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 Joint replacement 2009    rt total hip  . Tonsillectomy   . Leg surgery     fx rt leg as 71 yr old    Family History  Problem Relation Age of Onset  . Heart attack Father     History  Substance Use Topics  . Smoking status: Former Smoker -- 0.2 packs/day for 10 years    Types: Cigarettes    Quit date: 08/01/1968  . Smokeless tobacco: Not on file  . Alcohol Use: 0.6 oz/week    1 Cans of beer per week      Review of Systems  Constitutional: Negative for fever.       10 Systems reviewed and are negative for acute change except as noted in the HPI.  HENT: Negative for congestion.   Eyes: Negative for discharge and redness.  Respiratory: Negative for cough and shortness of breath.   Cardiovascular: Negative for chest pain.  Gastrointestinal: Negative for vomiting and abdominal pain.  Genitourinary: Positive for urgency, frequency, hematuria and flank pain.  Musculoskeletal: Negative for back pain.  Skin: Negative for rash.  Neurological: Negative for syncope, numbness and headaches.    Psychiatric/Behavioral:       No behavior change.    Allergies  Review of patient's allergies indicates no known allergies.  Home Medications   Current Outpatient Rx  Name Route Sig Dispense Refill  . ASPIRIN 81 MG PO TABS Oral Take 81 mg by mouth daily.      Marland Kitchen ESCITALOPRAM OXALATE 10 MG PO TABS Oral Take 10 mg by mouth daily.      Marland Kitchen FLUOXETINE HCL 20 MG PO CAPS Oral Take 20 mg by mouth daily.      Marland Kitchen PROMETHAZINE HCL 25 MG PO TABS Oral Take 1 tablet (25 mg total) by mouth every 6 (six) hours as needed for nausea. 30 tablet 0  . SIMVASTATIN 20 MG PO TABS Oral Take 20 mg by mouth at bedtime.      . TRAMADOL HCL 50 MG PO TABS Oral Take 50 mg by mouth every 6 (six) hours as needed.      BP 138/70  Pulse 94  Temp 98.9 F (37.2 C) (Oral)  Resp 20  Ht 6\' 1"  (1.854 m)  Wt 230 lb (104.327 kg)  BMI 30.34 kg/m2  SpO2 92%  Physical Exam  Nursing note and vitals reviewed. Constitutional: He appears well-developed and well-nourished.       Awake, alert, nontoxic appearance.  HENT:  Head: Atraumatic.  Eyes: Right  eye exhibits no discharge. Left eye exhibits no discharge.  Neck: Neck supple.  Cardiovascular: Normal heart sounds.   Pulmonary/Chest: Effort normal and breath sounds normal. He exhibits no tenderness.  Abdominal: Soft. There is no tenderness. There is no rebound.  Genitourinary:       No cva tenderness  Musculoskeletal: He exhibits no tenderness.       Baseline ROM, no obvious new focal weakness.  Neurological:       Mental status and motor strength appears baseline for patient and situation.  Skin: No rash noted.  Psychiatric: He has a normal mood and affect.    ED Course  Procedures (including critical care time) Results for orders placed during the hospital encounter of 02/25/12  URINALYSIS, ROUTINE W REFLEX MICROSCOPIC      Component Value Range   Color, Urine RED (*) YELLOW   APPearance CLOUDY (*) CLEAR   Specific Gravity, Urine 1.025  1.005 - 1.030   pH  5.0  5.0 - 8.0   Glucose, UA 100 (*) NEGATIVE mg/dL   Hgb urine dipstick LARGE (*) NEGATIVE   Bilirubin Urine NEGATIVE  NEGATIVE   Ketones, ur TRACE (*) NEGATIVE mg/dL   Protein, ur >096 (*) NEGATIVE mg/dL   Urobilinogen, UA 4.0 (*) 0.0 - 1.0 mg/dL   Nitrite POSITIVE (*) NEGATIVE   Leukocytes, UA MODERATE (*) NEGATIVE  URINE MICROSCOPIC-ADD ON      Component Value Range   Squamous Epithelial / LPF RARE  RARE   WBC, UA 11-20  <3 WBC/hpf   RBC / HPF TOO NUMEROUS TO COUNT  <3 RBC/hpf   Bacteria, UA FEW (*) RARE    Ct Abdomen Pelvis Wo Contrast  02/26/2012  *RADIOLOGY REPORT*  Clinical Data: Hematuria.  Left flank pain.  Kidney stone.  CT ABDOMEN AND PELVIS WITHOUT CONTRAST  Technique:  Multidetector CT imaging of the abdomen and pelvis was performed following the standard protocol without intravenous contrast.  Comparison: None.  Findings: Cholelithiasis. Unenhanced CT was performed per clinician order.  Lack of IV contrast limits sensitivity and specificity, especially for evaluation of abdominal/pelvic solid viscera.  Liver and spleen grossly normal.  Pancreas and common bile duct normal. Normal adrenal glands.  No renal collecting system calculi. Probable left renal sinus cysts.  The left ureter appears within normal limits.  Calcified phleboliths are present in the anatomic pelvis.  Right ureter also normal.  In the anatomic pelvis, there is artifact from right total hip arthroplasty which partially obscures the pelvis.  Colonic diverticulosis is present.  No diverticulitis.  Normal appendix. No free fluid in the abdomen or pelvis.  The urinary bladder is partially decompressed, within normal limits.  Mild abdominal aortic atherosclerosis.  Fat containing right inguinal hernia.  No destructive osseous lesions. Prostatomegaly is present.  IMPRESSION:  1.  Negative for urolithiasis.  Probable left renal sinus cysts. 2.  Cholelithiasis.  No noncontrast CT evidence of acute cholecystitis.  No  calcified common duct stone. 3.  No acute abnormality.  Original Report Authenticated By: Andreas Newport, M.D.     No diagnosis found.    MDM  Patient with onset hematuria at 6 PM associated with development of mild left flank pain. CT without evidence of stones. Initiated treatment with antibiotic. Referral to urology. Pt stable in ED with no significant deterioration in condition.The patient appears reasonably screened and/or stabilized for discharge and I doubt any other medical condition or other [facility] requiring further screening, evaluation, or treatment in the ED at this  time prior to discharge.  MDM Reviewed: nursing note and vitals Interpretation: labs and CT scan           Nicoletta Dress. Colon Branch, MD 02/26/12 0454

## 2012-02-27 ENCOUNTER — Ambulatory Visit (HOSPITAL_COMMUNITY): Payer: Self-pay | Admitting: Specialist

## 2012-03-03 ENCOUNTER — Ambulatory Visit (HOSPITAL_COMMUNITY): Payer: Self-pay | Admitting: Specialist

## 2012-03-03 ENCOUNTER — Ambulatory Visit (INDEPENDENT_AMBULATORY_CARE_PROVIDER_SITE_OTHER): Payer: BC Managed Care – PPO | Admitting: Urology

## 2012-03-03 ENCOUNTER — Ambulatory Visit (HOSPITAL_COMMUNITY)
Admission: RE | Admit: 2012-03-03 | Discharge: 2012-03-03 | Disposition: A | Discharge status: Home / Self Care | Payer: BC Managed Care – PPO | Source: Ambulatory Visit | Attending: Family Medicine | Admitting: Family Medicine

## 2012-03-03 DIAGNOSIS — R310 Gross hematuria: Secondary | ICD-10-CM

## 2012-03-03 DIAGNOSIS — M25519 Pain in unspecified shoulder: Secondary | ICD-10-CM

## 2012-03-03 DIAGNOSIS — N400 Enlarged prostate without lower urinary tract symptoms: Secondary | ICD-10-CM

## 2012-03-03 DIAGNOSIS — N529 Male erectile dysfunction, unspecified: Secondary | ICD-10-CM

## 2012-03-03 DIAGNOSIS — M6281 Muscle weakness (generalized): Secondary | ICD-10-CM

## 2012-03-03 NOTE — Progress Notes (Signed)
Occupational Therapy Treatment Patient Details  Name: Taylor Bean MRN: [id number] Date of Birth: 08-08-1940  Today's Date: 03/03/2012 Time: 4034-7425 OT Time Calculation (min): 26 min Manual Therapy 23' (3' interruption during treatment) Visit#: 12  of 24   Re-eval: 03/18/12    Authorization: workers compensation    Subjective S:  I missed last week due to a kidney stone.  Im still not feeling well, and have an appt with a urologist at 845. Pain Assessment Currently in Pain?: Yes Pain Score:   3 Pain Location: Shoulder Pain Orientation: Right Pain Type: Acute pain  Precautions/Restrictions   Progress as tolerated.  Exercise/Treatments Supine Protraction: PROM;10 reps Horizontal ABduction: PROM;10 reps External Rotation: PROM;10 reps Internal Rotation: PROM;10 reps Flexion: PROM;10 reps ABduction: PROM;10 reps    Manual Therapy Manual Therapy: Myofascial release Myofascial Release: MFR and manual stretching to right scapular, trapezius, anterior shoulder, and upper arm regions to decrease pain and restrictions and increase pain free mobility [phone]  Occupational Therapy Assessment and Plan OT Assessment and Plan Clinical Impression Statement: A:  Completed manual therapy only this date, as patient had an MD appointment to attend. OT Plan: P:  Resume therapeutic exercises, attempt thumb tacks, increase AAROM abduction to [facility].   Goals Short Term Goals Time to Complete Short Term Goals: 4 weeks Short Term Goal 1: Patient will be educated on HEP. Short Term Goal 2: Patient's R shoulder AROM will be assessed. Short Term Goal 2 Progress: Progressing toward goal Short Term Goal 3: Patient will increase PROM to WNL to increase ease with bathing and dressing. Short Term Goal 3 Progress: Progressing toward goal Short Term Goal 4: Patient will increase right shoulder strength to 3+/5 for increased ability to lift pots and pans to the stove. Short Term Goal 4 Progress:  Progressing toward goal Short Term Goal 5: Patient will decrease pain to 4/10 when getting in and out of bed. Short Term Goal 5 Progress: Progressing toward goal Additional Short Term Goals?: Yes Short Term Goal 6: Patient will decrease fascial restrictions from maximal to moderate in R shoulder region. Short Term Goal 6 Progress: Progressing toward goal Long Term Goals Time to Complete Long Term Goals: 8 weeks Long Term Goal 1: Patient will return to PLOF with all B/IADL, work and leisure activities. Long Term Goal 1 Progress: Progressing toward goal Long Term Goal 2: Patient will increase AROM of R shoulder to WNL for increased ability to perform work related duties. Long Term Goal 2 Progress: Progressing toward goal Long Term Goal 3: Patient will increase right shoulder strength to 5/5 for increased independence with yardwork. Long Term Goal 3 Progress: Progressing toward goal Long Term Goal 4: Patient will decrease pain to 2/10 during functional activities. Long Term Goal 4 Progress: Progressing toward goal Long Term Goal 5: Patient will have min-trace fascial restrictions in R shoulder region. Long Term Goal 5 Progress: Progressing toward goal  Problem List Patient Active Problem List  Diagnosis  . Right rotator cuff tear  . Muscle weakness (generalized)  . Pain in joint, shoulder region    End of Session Activity Tolerance: Patient tolerated treatment well General Behavior During Session: [facility] for tasks performed  GO    Shirlean Mylar, OTR/L  03/03/2012, 9:18 AM

## 2012-03-04 ENCOUNTER — Ambulatory Visit (HOSPITAL_COMMUNITY)
Admission: RE | Admit: 2012-03-04 | Discharge: 2012-03-04 | Disposition: A | Discharge status: Home / Self Care | Payer: BC Managed Care – PPO | Source: Ambulatory Visit | Attending: Orthopedic Surgery | Admitting: Orthopedic Surgery

## 2012-03-04 DIAGNOSIS — M6281 Muscle weakness (generalized): Secondary | ICD-10-CM

## 2012-03-04 DIAGNOSIS — M25519 Pain in unspecified shoulder: Secondary | ICD-10-CM

## 2012-03-04 NOTE — Progress Notes (Addendum)
Occupational Therapy Treatment Patient Details  Name: Taylor Bean MRN: [id number] Date of Birth: [birth year]  Today's Date: 03/04/2012 Time: 1478-2956 OT Time Calculation (min): 61 min Manual Therapy 101-129 28' Therapeutic Exercise 130-202 32'  Visit#: 13  of 24   Re-eval: 03/18/12 Assessment Diagnosis: s/p R RCR  Authorization: workers Pharmacist, community Time Period:    Authorization Visit#:   of    Subjective Symptoms/Limitations Symptoms: S:  Some positions I get into really hurt and the pain will jump up. Pain Assessment Currently in Pain?: Yes Pain Score:   7 Pain Location: Shoulder Pain Orientation: Right Pain Type: Acute pain Pain Frequency: Intermittent  Precautions/Restrictions     Exercise/Treatments Supine Protraction: PROM;AAROM;15 reps Horizontal ABduction: PROM;AAROM;15 reps External Rotation: PROM;AAROM;15 reps Internal Rotation: PROM;AAROM;15 reps Flexion: PROM;AAROM;15 reps ABduction: PROM;AAROM;15 reps Seated Elevation: AROM;15 reps Extension: AROM;15 reps Row: AROM;15 reps Protraction: AAROM;12 reps Horizontal ABduction: AAROM;12 reps External Rotation: AAROM;12 reps Internal Rotation: AAROM;12 reps Flexion: AAROM;12 reps Abduction: AAROM;10 reps Pulleys Flexion: 3 minutes ABduction: 3 minutes Therapy Ball Flexion: 25 reps ABduction: 25 reps ROM / Strengthening / Isometric Strengthening Thumb Tacks: 1' Prot/Ret//Elev/Dep: 1'      Manual Therapy Manual Therapy: Myofascial release Myofascial Release: MFR and manual stretching to right scapular, trapezius, anterior shoulder, and upper arm regions to decrease pain and restrictions and increase pain free mobility   Occupational Therapy Assessment and Plan OT Assessment and Plan Clinical Impression Statement: A; Resumed all exercises. Patient unable to complete full set of reps for seated AAROM abduction secondary to pain.  Added thumbtacks for 1' which patient tolerated  well. OT Plan: P:  Attempt to complete full set of reps with seated ABDuction AAROM without causing increased pain.   Goals Short Term Goals Time to Complete Short Term Goals: 4 weeks Short Term Goal 1: Patient will be educated on HEP. Short Term Goal 2: Patient's R shoulder AROM will be assessed. Short Term Goal 3: Patient will increase PROM to WNL to increase ease with bathing and dressing. Short Term Goal 4: Patient will increase right shoulder strength to 3+/5 for increased ability to lift pots and pans to the stove. Short Term Goal 5: Patient will decrease pain to 4/10 when getting in and out of bed. Additional Short Term Goals?: Yes Short Term Goal 6: Patient will decrease fascial restrictions from maximal to moderate in R shoulder region. Long Term Goals Time to Complete Long Term Goals: 8 weeks Long Term Goal 1: Patient will return to PLOF with all B/IADL, work and leisure activities. Long Term Goal 2: Patient will increase AROM of R shoulder to WNL for increased ability to perform work related duties. Long Term Goal 3: Patient will increase right shoulder strength to 5/5 for increased independence with yardwork. Long Term Goal 4: Patient will decrease pain to 2/10 during functional activities. Long Term Goal 5: Patient will have min-trace fascial restrictions in R shoulder region.  Problem List Patient Active Problem List  Diagnosis  . Right rotator cuff tear  . Muscle weakness (generalized)  . Pain in joint, shoulder region    End of Session Activity Tolerance: Patient tolerated treatment well General Behavior During Session: [facility] for tasks performed Cognition: [facility] for tasks performed  GO    [name], Kaye Luoma L 03/04/2012, 3:56 PM

## 2012-03-05 ENCOUNTER — Ambulatory Visit (HOSPITAL_COMMUNITY): Payer: Self-pay | Admitting: Specialist

## 2012-03-10 ENCOUNTER — Ambulatory Visit (HOSPITAL_COMMUNITY)
Admission: RE | Admit: 2012-03-10 | Discharge: 2012-03-10 | Disposition: A | Discharge status: Home / Self Care | Payer: BC Managed Care – PPO | Source: Ambulatory Visit | Attending: Family Medicine | Admitting: Family Medicine

## 2012-03-10 DIAGNOSIS — M6281 Muscle weakness (generalized): Secondary | ICD-10-CM

## 2012-03-10 DIAGNOSIS — M25519 Pain in unspecified shoulder: Secondary | ICD-10-CM

## 2012-03-10 NOTE — Progress Notes (Signed)
Occupational Therapy Treatment Patient Details  Name: Taylor Bean MRN: [id number] Date of Birth: 1940-08-19  Today's Date: 03/10/2012 Time: 4098-1191 OT Time Calculation (min): 48 min Manual Therapy [phone] 23' Therapeutic Exercises 9005-930 25' Visit#: 14  of 24   Re-eval: 03/18/12    Authorization: workers compensation    Subjective  S:  Im going back to work 4 hours today.   Precautions/Restrictions   Progress to AROM as AAROM improves to 80-90%.  Exercise/Treatments Supine Protraction: PROM;AROM;10 reps Horizontal ABduction: PROM;AROM;10 reps External Rotation: PROM;AROM;10 reps Internal Rotation: PROM;AROM;10 reps Flexion: PROM;AROM;10 reps ABduction: PROM;AROM;10 reps Seated Protraction: AAROM;15 reps Horizontal ABduction: AAROM;15 reps External Rotation: AAROM;15 reps Internal Rotation: AAROM;15 reps Flexion: AAROM;15 reps Flexion Limitations: completed with AA from OT rather than dowel Abduction: AAROM;15 reps ABduction Limitations: completed with facilitation from OT vs dowel Standing Extension: Theraband;15 reps Theraband Level (Shoulder Extension): Level 2 (Red) Row: Theraband;15 reps Theraband Level (Shoulder Row): Level 2 (Red) Pulleys Flexion: 3 minutes ABduction:  (omit this date, resume next visit) Therapy Ball Flexion: 20 reps ABduction: 20 reps Right/Left: 5 reps ROM / Strengthening / Isometric Strengthening Thumb Tacks: 1' Prot/Ret//Elev/Dep: 1'      Manual Therapy Manual Therapy: Myofascial release Myofascial Release: MFR and manual stretching to right scapular, trapezius, anterior shoulder, and upper arm regions to decrease pain and restrictions and increase pain free mobility [phone]  Occupational Therapy Assessment and Plan OT Assessment and Plan Clinical Impression Statement: A:  Increased AAROM in supine, therefore, began AROM in supine.  Required facilitation with flexion and abduction in seated vs use of dowel due to pain with  eccentric contraction.  Added ball circles this date. OT Plan: P:  Attempt flexion and abd with dowel rather than therapist assist.  Resume abduction pulley.  Less pain with AROM.     Goals Short Term Goals Time to Complete Short Term Goals: 4 weeks Short Term Goal 1: Patient will be educated on HEP. Short Term Goal 2: Patient's R shoulder AROM will be assessed. Short Term Goal 2 Progress: Progressing toward goal Short Term Goal 3: Patient will increase PROM to WNL to increase ease with bathing and dressing. Short Term Goal 3 Progress: Progressing toward goal Short Term Goal 4: Patient will increase right shoulder strength to 3+/5 for increased ability to lift pots and pans to the stove. Short Term Goal 4 Progress: Progressing toward goal Short Term Goal 5: Patient will decrease pain to 4/10 when getting in and out of bed. Short Term Goal 5 Progress: Progressing toward goal Additional Short Term Goals?: Yes Short Term Goal 6: Patient will decrease fascial restrictions from maximal to moderate in R shoulder region. Short Term Goal 6 Progress: Progressing toward goal Long Term Goals Time to Complete Long Term Goals: 8 weeks Long Term Goal 1: Patient will return to PLOF with all B/IADL, work and leisure activities. Long Term Goal 1 Progress: Progressing toward goal Long Term Goal 2: Patient will increase AROM of R shoulder to WNL for increased ability to perform work related duties. Long Term Goal 2 Progress: Progressing toward goal Long Term Goal 3: Patient will increase right shoulder strength to 5/5 for increased independence with yardwork. Long Term Goal 3 Progress: Progressing toward goal Long Term Goal 4: Patient will decrease pain to 2/10 during functional activities. Long Term Goal 4 Progress: Progressing toward goal Long Term Goal 5: Patient will have min-trace fascial restrictions in R shoulder region. Long Term Goal 5 Progress: Progressing toward goal  Problem  List Patient  Active Problem List  Diagnosis  . Right rotator cuff tear  . Muscle weakness (generalized)  . Pain in joint, shoulder region    End of Session Activity Tolerance: Patient tolerated treatment well General Behavior During Session: [facility] for tasks performed Cognition: [facility] for tasks performed  GO    Shirlean Mylar, OTR/L  03/10/2012, 9:48 AM

## 2012-03-11 ENCOUNTER — Ambulatory Visit (HOSPITAL_COMMUNITY)
Admission: RE | Admit: 2012-03-11 | Discharge: 2012-03-11 | Disposition: A | Discharge status: Home / Self Care | Payer: BC Managed Care – PPO | Source: Ambulatory Visit | Attending: Family Medicine | Admitting: Family Medicine

## 2012-03-11 DIAGNOSIS — M6281 Muscle weakness (generalized): Secondary | ICD-10-CM

## 2012-03-11 DIAGNOSIS — M25519 Pain in unspecified shoulder: Secondary | ICD-10-CM

## 2012-03-11 NOTE — Progress Notes (Signed)
Occupational Therapy Treatment Patient Details  Name: Taylor Bean MRN: [id number] Date of Birth: [birth year]  Today's Date: 03/11/2012 Time: 1478-2956 OT Time Calculation (min): 48 min Manual Therapy [id number] 19' Therapeutic Exercises 8469-6295 29' Visit#: 15  of 24   Re-eval: 03/18/12    Authorization: workers compensation    Subjective  S:  I was exhausted after working 4 hours.  I decided to change my appointments so that I can not work and do therapy in the same day.   Pain Assessment Currently in Pain?: Yes Pain Score:   7 Pain Location: Shoulder Pain Orientation: Right Pain Type: Acute pain  Precautions/Restrictions   Progress as tolerated to AROM once 80% AAROM in seated achieved.  Exercise/Treatments Supine Protraction: PROM;10 reps;AROM;12 reps Horizontal ABduction: PROM;10 reps;AROM;12 reps External Rotation: PROM;10 reps;AROM;12 reps Internal Rotation: PROM;10 reps;AROM;12 reps Flexion: PROM;10 reps;AROM;12 reps ABduction: PROM;10 reps;AROM;12 reps Seated Protraction: AAROM;15 reps Horizontal ABduction: AAROM;15 reps External Rotation: AAROM;15 reps Internal Rotation: AAROM;15 reps Flexion: AAROM;15 reps Flexion Limitations: completed with dowel rather than OT assist Abduction: AAROM;15 reps ABduction Limitations: 10 reps with dowel and 5 with OT assist Pulleys Flexion: 3 minutes ABduction: 3 minutes Therapy Ball Flexion: 20 reps ABduction: 20 reps Right/Left: 5 reps ROM / Strengthening / Isometric Strengthening Wall Wash: 1' Thumb Tacks: 1' Prot/Ret//Elev/Dep: 1'      Manual Therapy Manual Therapy: Myofascial release Myofascial Release: MFR and manual stretching to right scapular, trapezius, anterior shoulder, and upper arm regions to decrease pain and restrictions and increase pain free mobility 1305-1324  Occupational Therapy Assessment and Plan OT Assessment and Plan Clinical Impression Statement: A:  Able to complete AAROM for flexion  without OT assistance and wtih abduction required assist for last 5 reps only.  Added wall wash today.  Inadvertantly omitted tband exercises. OT Plan: P:  Resume tband exercises, increase wall wash time, REASSESS.   Goals Short Term Goals Time to Complete Short Term Goals: 4 weeks Short Term Goal 1: Patient will be educated on HEP. Short Term Goal 2: Patient's R shoulder AROM will be assessed. Short Term Goal 3: Patient will increase PROM to WNL to increase ease with bathing and dressing. Short Term Goal 4: Patient will increase right shoulder strength to 3+/5 for increased ability to lift pots and pans to the stove. Short Term Goal 5: Patient will decrease pain to 4/10 when getting in and out of bed. Additional Short Term Goals?: Yes Short Term Goal 6: Patient will decrease fascial restrictions from maximal to moderate in R shoulder region. Long Term Goals Time to Complete Long Term Goals: 8 weeks Long Term Goal 1: Patient will return to PLOF with all B/IADL, work and leisure activities. Long Term Goal 2: Patient will increase AROM of R shoulder to WNL for increased ability to perform work related duties. Long Term Goal 3: Patient will increase right shoulder strength to 5/5 for increased independence with yardwork. Long Term Goal 4: Patient will decrease pain to 2/10 during functional activities. Long Term Goal 5: Patient will have min-trace fascial restrictions in R shoulder region.  Problem List Patient Active Problem List  Diagnosis  . Right rotator cuff tear  . Muscle weakness (generalized)  . Pain in joint, shoulder region    End of Session Activity Tolerance: Patient tolerated treatment well General Behavior During Session: [facility] for tasks performed Cognition: [facility] for tasks performed  GO    Shirlean Mylar, OTR/L  03/11/2012, 1:58 PM

## 2012-03-12 ENCOUNTER — Ambulatory Visit (HOSPITAL_COMMUNITY): Payer: Self-pay | Admitting: Specialist

## 2012-03-17 ENCOUNTER — Ambulatory Visit (HOSPITAL_COMMUNITY)
Admission: RE | Admit: 2012-03-17 | Discharge: 2012-03-17 | Disposition: A | Discharge status: Home / Self Care | Payer: Worker's Compensation | Source: Ambulatory Visit | Attending: Orthopedic Surgery | Admitting: Orthopedic Surgery

## 2012-03-17 DIAGNOSIS — IMO0001 Reserved for inherently not codable concepts without codable children: Secondary | ICD-10-CM | POA: Insufficient documentation

## 2012-03-17 DIAGNOSIS — M25519 Pain in unspecified shoulder: Secondary | ICD-10-CM | POA: Insufficient documentation

## 2012-03-17 DIAGNOSIS — M6281 Muscle weakness (generalized): Secondary | ICD-10-CM | POA: Insufficient documentation

## 2012-03-17 DIAGNOSIS — M25619 Stiffness of unspecified shoulder, not elsewhere classified: Secondary | ICD-10-CM | POA: Insufficient documentation

## 2012-03-17 NOTE — Progress Notes (Signed)
Occupational Therapy Treatment Patient Details  Name: Taylor Bean MRN: [id number] Date of Birth: [birth year]  Today's Date: 03/17/2012 Time: 7829-5621 OT Time Calculation (min): 66 min Manual Therapy [phone] 23' Therapeutic Exercises [phone] 38' Visit#: 16  of 24   Re-eval: 03/19/12    Authorization: workers compensation    Subjective S:  I still have alot of soreness when I use my arm.  Pain Assessment Currently in Pain?: Yes Pain Score:   5 Pain Location: Shoulder Pain Orientation: Right Pain Type: Acute pain  Precautions/Restrictions   progress as tolerated   Exercise/Treatments Supine Protraction: PROM;10 reps;AROM;15 reps Horizontal ABduction: PROM;10 reps;AROM;15 reps External Rotation: PROM;10 reps;AROM;15 reps Internal Rotation: PROM;10 reps;AROM;15 reps Flexion: PROM;10 reps;AROM;15 reps ABduction: PROM;10 reps;AROM;15 reps Seated Protraction: AROM;10 reps Horizontal ABduction: AROM;10 reps External Rotation: AAROM;15 reps External Rotation Limitations: with facilitation from OTR/L  Internal Rotation: AAROM;15 reps Flexion: AAROM;15 reps Flexion Limitations: with facilitation from OTR/L Abduction: AAROM;15 reps ABduction Limitations: with facilitation from OTR/L Standing Extension: Theraband;15 reps Theraband Level (Shoulder Extension): Level 2 (Red) Row: Theraband;15 reps Theraband Level (Shoulder Row): Level 2 (Red) Retraction: Theraband;15 reps Theraband Level (Shoulder Retraction): Level 2 (Red) Pulleys Flexion: 3 minutes ABduction: 3 minutes Therapy Ball Flexion: 20 reps ABduction: 20 reps Right/Left: 5 reps ROM / Strengthening / Isometric Strengthening Wall Wash: 2' Thumb Tacks: 1' Prot/Ret//Elev/Dep: 1'      Manual Therapy Manual Therapy: Myofascial release Myofascial Release: MFR and manual stretching to right scapular, trapezius, anterior shoulder, and upper arm regions to decrease pain and restrictions and increase pain free  mobility [phone]  Occupational Therapy Assessment and Plan OT Assessment and Plan Clinical Impression Statement: A:  Able to complete AROM for protraction, horizontal abduction in seated this date.  AAROM in seated was completed with assist from therapist rather than dowel rod.  Patient still experiencing pain at end of available range.   OT Plan: P:  REASSESS for 9/11 MD visit.  Attempt AROM in seated for flexion, abduction, ER/IR if AAROM is 80% and pain free.   Goals Short Term Goals Time to Complete Short Term Goals: 4 weeks Short Term Goal 1: Patient will be educated on HEP. Short Term Goal 2: Patient's R shoulder AROM will be assessed. Short Term Goal 3: Patient will increase PROM to WNL to increase ease with bathing and dressing. Short Term Goal 4: Patient will increase right shoulder strength to 3+/5 for increased ability to lift pots and pans to the stove. Short Term Goal 5: Patient will decrease pain to 4/10 when getting in and out of bed. Additional Short Term Goals?: Yes Short Term Goal 6: Patient will decrease fascial restrictions from maximal to moderate in R shoulder region. Long Term Goals Time to Complete Long Term Goals: 8 weeks Long Term Goal 1: Patient will return to PLOF with all B/IADL, work and leisure activities. Long Term Goal 2: Patient will increase AROM of R shoulder to WNL for increased ability to perform work related duties. Long Term Goal 3: Patient will increase right shoulder strength to 5/5 for increased independence with yardwork. Long Term Goal 4: Patient will decrease pain to 2/10 during functional activities. Long Term Goal 5: Patient will have min-trace fascial restrictions in R shoulder region.  Problem List Patient Active Problem List  Diagnosis  . Right rotator cuff tear  . Muscle weakness (generalized)  . Pain in joint, shoulder region    End of Session Activity Tolerance: Patient tolerated treatment well General Behavior During Session:  [facility] for tasks performed Cognition: [facility] for tasks performed  GO    Shirlean Mylar, OTR/L  03/17/2012, 9:53 AM

## 2012-03-19 ENCOUNTER — Ambulatory Visit (HOSPITAL_COMMUNITY)
Admission: RE | Admit: 2012-03-19 | Discharge: 2012-03-19 | Disposition: A | Discharge status: Home / Self Care | Payer: Worker's Compensation | Source: Ambulatory Visit | Attending: Family Medicine | Admitting: Family Medicine

## 2012-03-19 DIAGNOSIS — M25519 Pain in unspecified shoulder: Secondary | ICD-10-CM

## 2012-03-19 DIAGNOSIS — M6281 Muscle weakness (generalized): Secondary | ICD-10-CM

## 2012-03-19 NOTE — Progress Notes (Signed)
Occupational Therapy Treatment Patient Details  Name: Taylor Bean MRN: [id number] Date of Birth: [birth year]  Today's Date: 03/19/2012 Time: 1191-4782 OT Time Calculation (min): 60 min Manual Therapy [phone] 22' Reassessment [phone] Therapeutic Exercises 925-950 25' Visit#: 17  of 24   Re-eval: 04/16/12    Authorization: workers compensation    Subjective S:  I have alot of popping and clicking when I move my shoulder. Pain Assessment Currently in Pain?: Yes Pain Score:   5 Pain Location: Shoulder Pain Orientation: Right Pain Type: Acute pain  Precautions/Restrictions   Progress as tolerated  Exercise/Treatments Supine Protraction: PROM;10 reps;AROM;15 reps Horizontal ABduction: PROM;10 reps;AROM;15 reps External Rotation: PROM;10 reps;AROM;15 reps Internal Rotation: PROM;10 reps;AROM;15 reps Flexion: PROM;10 reps;AROM;15 reps ABduction: PROM;10 reps;AROM;15 reps Seated Protraction: AROM;10 reps Horizontal ABduction: AROM;10 reps External Rotation: AROM;10 reps Internal Rotation: AROM;10 reps Flexion: AROM;10 reps Abduction: AAROM ABduction Limitations: with facilitation from OTR/L Standing External Rotation: Theraband;15 reps Theraband Level (Shoulder External Rotation): Level 2 (Red) Internal Rotation: Theraband;15 reps Theraband Level (Shoulder Internal Rotation): Level 2 (Red) Extension: Theraband;15 reps Theraband Level (Shoulder Extension): Level 2 (Red) Row: Theraband;15 reps Theraband Level (Shoulder Row): Level 2 (Red) Retraction: Theraband;15 reps Theraband Level (Shoulder Retraction): Level 2 (Red) Pulleys Flexion:  (dc) ABduction:  (dc) Therapy Ball Flexion:  (dc) ABduction:  (dc) Right/Left: 5 reps ROM / Strengthening / Isometric Strengthening Wall Wash: 2' Thumb Tacks: 1' Prot/Ret//Elev/Dep: 1'      Manual Therapy Manual Therapy: Myofascial release Myofascial Release: MFR and manual stretching to right scapular, trapezius, anterior  shoulder, and upper arm regions to decrease pain and restrictions and increase pain free mobility [phone]  Occupational Therapy Assessment and Plan OT Assessment and Plan Clinical Impression Statement: A: Reassessment completed this date. Please refer to letter section of this chart for details. Began ER and IR with tband this date.  Able to complete AROM in seated in all ranges except abduction.   Rehab Potential: Good OT Frequency: Min 2X/week OT Duration: 4 weeks OT Plan: P:  Continue 2 x week x 4 weeks.  Complete abduction without assistance from therapist.  Add scapular depression with tband.  Begin UBE.     Goals Short Term Goals Time to Complete Short Term Goals: 4 weeks Short Term Goal 1: Patient will be educated on HEP. Short Term Goal 1 Progress: Met Short Term Goal 2: Patient's R shoulder AROM will be assessed. Short Term Goal 2 Progress: Met Short Term Goal 3: Patient will increase PROM to WNL to increase ease with bathing and dressing. Short Term Goal 3 Progress: Met Short Term Goal 4: Patient will increase right shoulder strength to 3+/5 for increased ability to lift pots and pans to the stove. Short Term Goal 4 Progress: Met Short Term Goal 5: Patient will decrease pain to 4/10 when getting in and out of bed. Short Term Goal 5 Progress: Met Additional Short Term Goals?: Yes Short Term Goal 6: Patient will decrease fascial restrictions from maximal to moderate in R shoulder region. Short Term Goal 6 Progress: Met Long Term Goals Time to Complete Long Term Goals: 8 weeks Long Term Goal 1: Patient will return to PLOF with all B/IADL, work and leisure activities. Long Term Goal 1 Progress: Progressing toward goal Long Term Goal 2: Patient will increase AROM of R shoulder to WNL for increased ability to perform work related duties. Long Term Goal 2 Progress: Progressing toward goal Long Term Goal 3: Patient will increase right shoulder strength to 5/5 for increased  independence with yardwork. Long Term Goal 3 Progress: Progressing toward goal Long Term Goal 4: Patient will decrease pain to 2/10 during functional activities. Long Term Goal 4 Progress: Progressing toward goal Long Term Goal 5: Patient will have min-trace fascial restrictions in R shoulder region. Long Term Goal 5 Progress: Progressing toward goal  Problem List Patient Active Problem List  Diagnosis  . Right rotator cuff tear  . Muscle weakness (generalized)  . Pain in joint, shoulder region    End of Session Activity Tolerance: Patient tolerated treatment well General Behavior During Session: [facility] for tasks performed Cognition: [facility] for tasks performed    Shirlean Mylar, OTR/L  03/19/2012, 9:58 AM

## 2012-04-01 ENCOUNTER — Ambulatory Visit (HOSPITAL_COMMUNITY)
Admission: RE | Admit: 2012-04-01 | Discharge: 2012-04-01 | Disposition: A | Discharge status: Home / Self Care | Payer: Worker's Compensation | Source: Ambulatory Visit | Attending: Family Medicine | Admitting: Family Medicine

## 2012-04-01 DIAGNOSIS — M6281 Muscle weakness (generalized): Secondary | ICD-10-CM

## 2012-04-01 DIAGNOSIS — M25519 Pain in unspecified shoulder: Secondary | ICD-10-CM

## 2012-04-01 NOTE — Progress Notes (Signed)
Occupational Therapy Treatment Patient Details  Name: Taylor Bean MRN: [id number] Date of Birth: [birth year]  Today's Date: 04/01/2012 Time: 5621-3086 OT Time Calculation (min): 46 min Manual Therapy 104-125 21' Therapeutic Exercise 126-150 24'  Visit#: 18  of 24   Re-eval: 04/16/12 Assessment Diagnosis: s/p R RCR  Authorization: workers [employer], community Time Period:    Authorization Visit#:   of    Subjective Symptoms/Limitations Symptoms: S:  If I raise it out to the side I have pain but it is better, the doctor was pleased  Precautions/Restrictions     Exercise/Treatments Supine Protraction: PROM;10 reps;AROM;15 reps Horizontal ABduction: PROM;10 reps;AROM;15 reps External Rotation: PROM;10 reps;AROM;15 reps Internal Rotation: PROM;10 reps;AROM;15 reps Flexion: PROM;10 reps;AROM;15 reps ABduction: PROM;10 reps;AROM;15 reps Seated Protraction: AROM;12 reps Horizontal ABduction: AROM;12 reps External Rotation: AROM;12 reps Internal Rotation: AROM;12 reps Flexion: AROM;12 reps Abduction: AAROM;12 reps ABduction Limitations: with assist to reach full available range.  Eccentric resistance for pain releif. Standing External Rotation:  (time, resume next session)    Therapy Ball Right/Left: 5 reps ROM / Strengthening / Isometric Strengthening UBE (Upper Arm Bike): 3' forward 3' backwards Wall Wash: 3' Thumb Tacks: 1' Prot/Ret//Elev/Dep: 1'      Manual Therapy Manual Therapy: Myofascial release Myofascial Release: MFR and manual stretching to right scapular, trapezius, anterior shoulder, and upper arm regions to decrease pain and restrictions and increase pain free mobility  Occupational Therapy Assessment and Plan OT Assessment and Plan Clinical Impression Statement: A:  Added UBE which patient stated he liked, felt like it loosened him up. OT Plan: P:  Add scapular depression with tband, resume tband which was omitted for time.   Goals Short  Term Goals Time to Complete Short Term Goals: 4 weeks Short Term Goal 1: Patient will be educated on HEP. Short Term Goal 2: Patient's R shoulder AROM will be assessed. Short Term Goal 3: Patient will increase PROM to WNL to increase ease with bathing and dressing. Short Term Goal 4: Patient will increase right shoulder strength to 3+/5 for increased ability to lift pots and pans to the stove. Short Term Goal 5: Patient will decrease pain to 4/10 when getting in and out of bed. Additional Short Term Goals?: Yes Short Term Goal 6: Patient will decrease fascial restrictions from maximal to moderate in R shoulder region. Long Term Goals Time to Complete Long Term Goals: 8 weeks Long Term Goal 1: Patient will return to PLOF with all B/IADL, work and leisure activities. Long Term Goal 2: Patient will increase AROM of R shoulder to WNL for increased ability to perform work related duties. Long Term Goal 3: Patient will increase right shoulder strength to 5/5 for increased independence with yardwork. Long Term Goal 4: Patient will decrease pain to 2/10 during functional activities. Long Term Goal 5: Patient will have min-trace fascial restrictions in R shoulder region.  Problem List Patient Active Problem List  Diagnosis  . Right rotator cuff tear  . Muscle weakness (generalized)  . Pain in joint, shoulder region    End of Session Activity Tolerance: Patient tolerated treatment well General Behavior During Session: [facility] for tasks performed Cognition: [facility] for tasks performed  GO   Hershy Flenner L. Dyann Goodspeed, COTA/L  04/01/2012, 3:53 PM

## 2012-04-07 ENCOUNTER — Ambulatory Visit (HOSPITAL_COMMUNITY): Payer: Self-pay | Admitting: Occupational Therapy

## 2012-04-08 ENCOUNTER — Ambulatory Visit (HOSPITAL_COMMUNITY)
Admission: RE | Admit: 2012-04-08 | Discharge: 2012-04-08 | Disposition: A | Discharge status: Home / Self Care | Payer: Worker's Compensation | Source: Ambulatory Visit | Attending: Family Medicine | Admitting: Family Medicine

## 2012-04-08 DIAGNOSIS — M6281 Muscle weakness (generalized): Secondary | ICD-10-CM

## 2012-04-08 DIAGNOSIS — M25519 Pain in unspecified shoulder: Secondary | ICD-10-CM

## 2012-04-08 NOTE — Progress Notes (Signed)
Occupational Therapy Treatment Patient Details  Name: Taylor Bean MRN: [id number] Date of Birth: [birth year]  Today's Date: 04/08/2012 Time: 3329-5188 OT Time Calculation (min): 57 min Manual Therapy 310-335 25' Therapeutic Exercise 335-407 32'  Visit#: 20  of 24   Re-eval: 04/16/12 Assessment Diagnosis: s/p R RCR  Authorization: workers Pharmacist, community Time Period:    Authorization Visit#:   of    Subjective Symptoms/Limitations Symptoms: S:  I feel great, they are going to increase my work hours. Pain Assessment Currently in Pain?: Yes Pain Score:   5 Pain Location: Shoulder Pain Orientation: Right   Exercise/Treatments Supine Protraction: PROM;10 reps;AROM;15 reps Horizontal ABduction: PROM;10 reps;AROM;15 reps External Rotation: PROM;10 reps;AROM;15 reps Internal Rotation: PROM;10 reps;AROM;15 reps Flexion: PROM;10 reps;AROM;15 reps ABduction: PROM;10 reps;AROM;15 reps Seated Protraction: AROM;15 reps Horizontal ABduction: AROM;15 reps External Rotation: AROM;15 reps Internal Rotation: AROM;12 reps Flexion: AROM;12 reps Abduction: AAROM;12 reps ABduction Limitations: with assist to reach full available range.  Eccentric resistance for pain releif. Therapy Ball Right/Left: 5 reps ROM / Strengthening / Isometric Strengthening UBE (Upper Arm Bike): 3' forward 3' backwards 1.5 Wall Wash: 4 Thumb Tacks: 1 Prot/Ret//Elev/Dep: 1       Manual Therapy Manual Therapy: Myofascial release Myofascial Release: MFR and manual stretching to right scapular, trapezius, anterior shoulder, and upper arm regions to decrease pain and restrictions and increase pain free mobility   Occupational Therapy Assessment and Plan OT Assessment and Plan Clinical Impression Statement: A:  Continues to need assist to complete AROM in seated.  Added shoulder depresseion with green band. Rehab Potential: Good OT Plan: P:  Increase resistance with UBE.   Goals Short Term  Goals Time to Complete Short Term Goals: 4 weeks Short Term Goal 1: Patient will be educated on HEP. Short Term Goal 2: Patient's R shoulder AROM will be assessed. Short Term Goal 3: Patient will increase PROM to WNL to increase ease with bathing and dressing. Short Term Goal 4: Patient will increase right shoulder strength to 3+/5 for increased ability to lift pots and pans to the stove. Short Term Goal 5: Patient will decrease pain to 4/10 when getting in and out of bed. Additional Short Term Goals?: Yes Short Term Goal 6: Patient will decrease fascial restrictions from maximal to moderate in R shoulder region. Long Term Goals Time to Complete Long Term Goals: 8 weeks Long Term Goal 1: Patient will return to PLOF with all B/IADL, work and leisure activities. Long Term Goal 2: Patient will increase AROM of R shoulder to WNL for increased ability to perform work related duties. Long Term Goal 3: Patient will increase right shoulder strength to 5/5 for increased independence with yardwork. Long Term Goal 4: Patient will decrease pain to 2/10 during functional activities. Long Term Goal 5: Patient will have min-trace fascial restrictions in R shoulder region.  Problem List Patient Active Problem List  Diagnosis  . Right rotator cuff tear  . Muscle weakness (generalized)  . Pain in joint, shoulder region    End of Session Activity Tolerance: Patient tolerated treatment well General Behavior During Session: [facility] for tasks performed Cognition: [facility] for tasks performed  GO   Ashtian Villacis L. [name], COTA/L  04/08/2012, 4:21 PM

## 2012-04-10 ENCOUNTER — Ambulatory Visit (HOSPITAL_COMMUNITY): Payer: Self-pay | Admitting: Occupational Therapy

## 2012-04-13 ENCOUNTER — Ambulatory Visit (HOSPITAL_COMMUNITY)
Admission: RE | Admit: 2012-04-13 | Discharge: 2012-04-13 | Disposition: A | Discharge status: Home / Self Care | Payer: Worker's Compensation | Source: Ambulatory Visit

## 2012-04-13 DIAGNOSIS — M25519 Pain in unspecified shoulder: Secondary | ICD-10-CM

## 2012-04-13 DIAGNOSIS — M6281 Muscle weakness (generalized): Secondary | ICD-10-CM

## 2012-04-13 NOTE — Progress Notes (Signed)
Occupational Therapy Treatment Patient Details  Name: Taylor Bean MRN: [id number] Date of Birth: Jul 25, 1940  Today's Date: 04/13/2012 Time: 0865-7846 OT Time Calculation (min): 54 min Manual Therapy: 139-207 28' Therapeutic Exercise: 207-233 26'  Visit#: 21  of 24   Re-eval: 04/16/12    Authorization: workers compensation  Authorization Time Period:    Authorization Visit#:   of    Subjective Symptoms/Limitations Symptoms: S: Im doing great. Im working 12 hrs now. However, I did strain my left arm over the weekend and it is hurting today.  Pain Assessment Currently in Pain?: Yes Pain Score:   5 Pain Location: Shoulder Pain Orientation: Right Pain Type: Acute pain  Precautions/Restrictions   N/A  Exercise/Treatments Supine Protraction: PROM;10 reps;AROM;15 reps Horizontal ABduction: PROM;10 reps;AROM;15 reps External Rotation: PROM;10 reps;AROM;15 reps Internal Rotation: PROM;10 reps;AROM;15 reps Flexion: PROM;10 reps;AROM;15 reps ABduction: PROM;10 reps;AROM;15 reps Seated Protraction: AROM;15 reps Horizontal ABduction: AROM;15 reps External Rotation: AROM;15 reps Internal Rotation: AROM;15 reps Flexion: AROM;15 reps Abduction: AAROM;12 reps ABduction Limitations: with assist to reach full available range.  Eccentric resistance for pain releif.    Standing External Rotation: Theraband;12 reps Theraband Level (Shoulder External Rotation): Level 3 (Green) Internal Rotation: Theraband;12 reps Theraband Level (Shoulder Internal Rotation): Level 3 (Green) Extension: Theraband;12 reps Theraband Level (Shoulder Extension): Level 3 (Green) Row: Theraband;12 reps Theraband Level (Shoulder Row): Level 3 (Green) Retraction: Theraband;12 reps Theraband Level (Shoulder Retraction): Level 3 (Green) Other Standing Exercises: shoulder depression with green x 10 Therapy Ball Right/Left: 5 reps ROM / Strengthening / Isometric Strengthening UBE (Upper Arm Bike): 3'  forward 3' backwards 2.0 Wall Wash: 4 Thumb Tacks: 1 Prot/Ret//Elev/Dep: 1        Manual Therapy Manual Therapy: Myofascial release Myofascial Release: MFR and manual stretching to right scapular, trapezius, anterior shoulder, and upper arm regions to decrease pain and restrictions and increase pain free mobility  Occupational Therapy Assessment and Plan OT Assessment and Plan Clinical Impression Statement: A: Pt completed AROM in seated with min assistance during ABD, flex, and HAB. Pt required vg for proper positioning and keep torso still while exercising shoulder. Increased thband exercises, pt tolerated well. Increase resistance on UBE and pt tolereated well. Pt stated before leaving today that he was not experiencing pain in his right shoulder.  Rehab Potential: Good OT Plan: P: Increase AROM in seated and supine. Increase reps on thband exercises for increased strength and increase time on wall wash for scapular mobility and strengthening.    Goals Short Term Goals Time to Complete Short Term Goals: 4 weeks Short Term Goal 1: Patient will be educated on HEP. Short Term Goal 2: Patient's R shoulder AROM will be assessed. Short Term Goal 3: Patient will increase PROM to WNL to increase ease with bathing and dressing. Short Term Goal 4: Patient will increase right shoulder strength to 3+/5 for increased ability to lift pots and pans to the stove. Short Term Goal 5: Patient will decrease pain to 4/10 when getting in and out of bed. Additional Short Term Goals?: Yes Short Term Goal 6: Patient will decrease fascial restrictions from maximal to moderate in R shoulder region. Long Term Goals Time to Complete Long Term Goals: 8 weeks Long Term Goal 1: Patient will return to PLOF with all B/IADL, work and leisure activities. Long Term Goal 2: Patient will increase AROM of R shoulder to WNL for increased ability to perform work related duties. Long Term Goal 3: Patient will increase right  shoulder strength to 5/5  for increased independence with yardwork. Long Term Goal 4: Patient will decrease pain to 2/10 during functional activities. Long Term Goal 5: Patient will have min-trace fascial restrictions in R shoulder region.  Problem List Patient Active Problem List  Diagnosis  . Right rotator cuff tear  . Muscle weakness (generalized)  . Pain in joint, shoulder region    End of Session Activity Tolerance: Patient tolerated treatment well General Behavior During Session: [facility] for tasks performed Cognition: [facility] for tasks performed  GO   Judi Saa, OTAS  04/13/2012, 3:43 PM

## 2012-04-13 NOTE — Progress Notes (Signed)
Note reviewed by clinical instructor and accurately reflects treatment session.  Indiah Heyden L. Laurabeth Yip, COTA/L  

## 2012-04-14 ENCOUNTER — Ambulatory Visit (HOSPITAL_COMMUNITY): Payer: Self-pay | Admitting: Occupational Therapy

## 2012-04-15 ENCOUNTER — Ambulatory Visit (HOSPITAL_COMMUNITY)
Admission: RE | Admit: 2012-04-15 | Discharge: 2012-04-15 | Disposition: A | Discharge status: Home / Self Care | Payer: Worker's Compensation | Source: Ambulatory Visit | Attending: Orthopedic Surgery | Admitting: Orthopedic Surgery

## 2012-04-15 DIAGNOSIS — M6281 Muscle weakness (generalized): Secondary | ICD-10-CM | POA: Insufficient documentation

## 2012-04-15 DIAGNOSIS — IMO0001 Reserved for inherently not codable concepts without codable children: Secondary | ICD-10-CM | POA: Insufficient documentation

## 2012-04-15 DIAGNOSIS — M25619 Stiffness of unspecified shoulder, not elsewhere classified: Secondary | ICD-10-CM | POA: Insufficient documentation

## 2012-04-15 DIAGNOSIS — M25519 Pain in unspecified shoulder: Secondary | ICD-10-CM | POA: Insufficient documentation

## 2012-04-15 NOTE — Progress Notes (Addendum)
Occupational Therapy Treatment Patient Details  Name: Taylor Bean MRN: [id number] Date of Birth: 1941-03-19  Today's Date: 04/15/2012 Time: 4098-1191 OT Time Calculation (min): 52 min Manual Therapy 140-201 21 Therapeutic Exercise 202-232 30  Visit#: 22  of 24   Re-eval: 04/16/12    Subjective Symptoms/Limitations Symptoms: S:  It only hurts in a certain degree Pain Assessment Currently in Pain?: No/denies Pain Score: 0-No pain  Precautions/Restrictions     Exercise/Treatments Supine Protraction: PROM;Strengthening;10 reps Protraction Weight (lbs): 1 Horizontal ABduction: PROM;Strengthening;10 reps Horizontal ABduction Weight (lbs): 1 External Rotation: PROM;Strengthening;10 reps External Rotation Weight (lbs): 1 Internal Rotation: PROM;Strengthening;10 reps Internal Rotation Weight (lbs): 1 Flexion: PROM;Strengthening;10 reps Shoulder Flexion Weight (lbs): 1 ABduction: PROM;Strengthening;10 reps Shoulder ABduction Weight (lbs): 1 Seated Protraction: AROM;15 reps Horizontal ABduction: AROM;15 reps External Rotation: AROM;15 reps Internal Rotation: AROM;15 reps Flexion: AAROM;10 reps Flexion Limitations: with AA from therapist, unable to complete 15 secondary to pain Abduction: AROM;15 reps Standing External Rotation: Theraband;15 reps Theraband Level (Shoulder External Rotation): Level 3 (Green) Internal Rotation: Theraband;15 reps Theraband Level (Shoulder Internal Rotation): Level 3 (Green) Extension: Theraband;15 reps Theraband Level (Shoulder Extension): Level 3 (Green) Row: Theraband;15 reps Theraband Level (Shoulder Row): Level 3 (Green) Retraction: Theraband;12 reps;15 reps;10 reps Theraband Level (Shoulder Retraction): Level 3 (Green) Other Standing Exercises: shoulder depression with green x 12 Therapy Ball Right/Left: 5 reps ROM / Strengthening / Isometric Strengthening UBE (Upper Arm Bike): 3' forward 3' backwards 2.5 Wall Wash: 2' with  1# Thumb Tacks: 1 Prot/Ret//Elev/Dep: 1       Manual Therapy Manual Therapy: Myofascial release Myofascial Release: MFR and manual stretching to right scapular, trapezius, anterior shoulder, and upper arm regions to decrease pain and restrictions and increase pain free mobility   Occupational Therapy Assessment and Plan OT Assessment and Plan Clinical Impression Statement: A: Decreased assist with ABD in seated but still required assist with flexion and was only able to complete 10 vs 15 secondary to pain. Shoulder AROM seated flexion 122, abduction 105, IR 90, ER 55.  Patient has met all STG's and one LTG. OT Plan: P:  Increase reps with supine exercises and attempt to complete flexion and abduction without assist.  REASSESS   Goals Short Term Goals Time to Complete Short Term Goals: 4 weeks Short Term Goal 1: Patient will be educated on HEP. Short Term Goal 1 Progress: Met Short Term Goal 2: Patient's R shoulder AROM will be assessed. Short Term Goal 2 Progress: Met Short Term Goal 3: Patient will increase PROM to WNL to increase ease with bathing and dressing. Short Term Goal 3 Progress: Met Short Term Goal 4: Patient will increase right shoulder strength to 3+/5 for increased ability to lift pots and pans to the stove. Short Term Goal 4 Progress: Met Short Term Goal 5: Patient will decrease pain to 4/10 when getting in and out of bed. Short Term Goal 5 Progress: Met Additional Short Term Goals?: Yes Short Term Goal 6: Patient will decrease fascial restrictions from maximal to moderate in R shoulder region. Short Term Goal 6 Progress: Met Long Term Goals Time to Complete Long Term Goals: 8 weeks Long Term Goal 1: Patient will return to PLOF with all B/IADL, work and leisure activities. Long Term Goal 1 Progress: Progressing toward goal Long Term Goal 2: Patient will increase AROM of R shoulder to WNL for increased ability to perform work related duties. Long Term Goal 2  Progress: Progressing toward goal Long Term Goal 3: Patient will increase  right shoulder strength to 5/5 for increased independence with yardwork. Long Term Goal 3 Progress: Progressing toward goal Long Term Goal 4: Patient will decrease pain to 2/10 during functional activities. Long Term Goal 4 Progress: Progressing toward goal Long Term Goal 5: Patient will have min-trace fascial restrictions in R shoulder region. Long Term Goal 5 Progress: Met  Problem List Patient Active Problem List  Diagnosis  . Right rotator cuff tear  . Muscle weakness (generalized)  . Pain in joint, shoulder region    End of Session Activity Tolerance: Patient tolerated treatment well General Behavior During Session: [facility] for tasks performed Cognition: [facility] for tasks performed  GO   Loletta Harper L. Clarence Dunsmore, COTA/L  04/15/2012, 4:45 PM

## 2012-04-16 ENCOUNTER — Ambulatory Visit (HOSPITAL_COMMUNITY): Payer: Self-pay | Admitting: Specialist

## 2012-04-20 ENCOUNTER — Ambulatory Visit (HOSPITAL_COMMUNITY)
Admission: RE | Admit: 2012-04-20 | Discharge: 2012-04-20 | Disposition: A | Discharge status: Home / Self Care | Payer: Worker's Compensation | Source: Ambulatory Visit | Attending: Orthopedic Surgery | Admitting: Orthopedic Surgery

## 2012-04-20 DIAGNOSIS — M6281 Muscle weakness (generalized): Secondary | ICD-10-CM

## 2012-04-20 DIAGNOSIS — M25519 Pain in unspecified shoulder: Secondary | ICD-10-CM

## 2012-04-20 NOTE — Progress Notes (Signed)
Occupational Therapy Treatment Patient Details  Name: Taylor Bean MRN: [id number] Date of Birth: August 20, 1940  Today's Date: 04/20/2012 Time: 4098-1191 OT Time Calculation (min): 45 min Manual Therapy [id number] 17' Therapeutic Exercises [phone] 28' Visit#: 23  of 32   Re-eval: 05/18/12    Authorization: workers compensation    Subjective Symptoms/Limitations Symptoms: S:  I still have trouble reaching into my back pocket or combing my hair. Special Tests: UEFI: was 43% and is currently 45% Pain Assessment Currently in Pain?: No/denies  Precautions/Restrictions   Progress as tolerated  Exercise/Treatments Supine Protraction: PROM;10 reps Horizontal ABduction: PROM;10 reps External Rotation: PROM;10 reps Internal Rotation: PROM;10 reps Flexion: PROM;10 reps ABduction: PROM;10 reps Seated Protraction: AROM;15 reps Horizontal ABduction: AROM;15 reps External Rotation: AROM;15 reps Internal Rotation: AROM;15 reps Flexion: AROM Flexion Limitations: limited to 50% Abduction: AROM;15 reps ABduction Limitations: limited to 50% PExtension:  (omit today) Therapy Ball Right/Left:  (dc) ROM / Strengthening / Isometric Strengthening Wall Wash: 3' with 1# Thumb Tacks: dc "W" Arms: x 10 with min facilitation and moderate vg X to V Arms: x 10 with min facilitation and moderate vg Prot/Ret//Elev/Dep: dc         Manual Therapy Myofascial Release: MFR and manual stretching to right scapular, trapezius, anterior shoulder, and upper arm regions to decrease pain and restrictions and increase pain free mobility  [id number]  Occupational Therapy Assessment and Plan OT Assessment and Plan Clinical Impression Statement: A:  vg required to avoid compensatory movements with right shoulder flexion and abductoin.  Please refer to monthly progress note for details. OT Plan: P:  Continue 2 x week x 4 weeks to increase AROM and strength needed to return to full duties at work.  Increase I  with flexion and abduction by decreasing vg and facilitation required.  Resume strengthening in supine.   Goals Short Term Goals Time to Complete Short Term Goals: 4 weeks Short Term Goal 1: Patient will be educated on HEP. Short Term Goal 1 Progress: Met Short Term Goal 2: Patient's R shoulder AROM will be assessed. Short Term Goal 2 Progress: Met Short Term Goal 3: Patient will increase PROM to WNL to increase ease with bathing and dressing. Short Term Goal 3 Progress: Met Short Term Goal 4: Patient will increase right shoulder strength to 3+/5 for increased ability to lift pots and pans to the stove. Short Term Goal 4 Progress: Met Short Term Goal 5: Patient will decrease pain to 4/10 when getting in and out of bed. Short Term Goal 5 Progress: Met Additional Short Term Goals?: Yes Short Term Goal 6: Patient will decrease fascial restrictions from maximal to moderate in R shoulder region. Short Term Goal 6 Progress: Met Long Term Goals Time to Complete Long Term Goals: 8 weeks Long Term Goal 1: Patient will return to PLOF with all B/IADL, work and leisure activities. Long Term Goal 1 Progress: Progressing toward goal Long Term Goal 2: Patient will increase AROM of R shoulder to WNL for increased ability to perform work related duties. Long Term Goal 2 Progress: Progressing toward goal Long Term Goal 3: Patient will increase right shoulder strength to 5/5 for increased independence with yardwork. Long Term Goal 3 Progress: Progressing toward goal Long Term Goal 4: Patient will decrease pain to 2/10 during functional activities. Long Term Goal 4 Progress: Progressing toward goal Long Term Goal 5: Patient will have min-trace fascial restrictions in R shoulder region. Long Term Goal 5 Progress: Progressing toward goal  Problem List Patient Active  Problem List  Diagnosis  . Right rotator cuff tear  . Muscle weakness (generalized)  . Pain in joint, shoulder region    End of  Session Activity Tolerance: Patient tolerated treatment well General Behavior During Session: [facility] for tasks performed Cognition: [facility] for tasks performed  GO    Shirlean Mylar, OTR/L  04/20/2012, 2:36 PM

## 2012-04-21 ENCOUNTER — Ambulatory Visit (HOSPITAL_COMMUNITY): Payer: Self-pay | Admitting: Occupational Therapy

## 2012-04-22 ENCOUNTER — Ambulatory Visit (HOSPITAL_COMMUNITY)
Admission: RE | Admit: 2012-04-22 | Discharge: 2012-04-22 | Disposition: A | Discharge status: Home / Self Care | Payer: Worker's Compensation | Source: Ambulatory Visit | Attending: Orthopedic Surgery | Admitting: Orthopedic Surgery

## 2012-04-22 DIAGNOSIS — M6281 Muscle weakness (generalized): Secondary | ICD-10-CM

## 2012-04-22 DIAGNOSIS — M25519 Pain in unspecified shoulder: Secondary | ICD-10-CM

## 2012-04-22 NOTE — Progress Notes (Signed)
Occupational Therapy Treatment Patient Details  Name: COBIE MARCOUX MRN: [id number] Date of Birth: Aug 27, 1940  Today's Date: 04/22/2012 Time: 4098-1191 OT Time Calculation (min): 45 min Manual Therapy 104-122 18' Therapeutic Exercise 123-149 26'  Visit#: 24  of 32   Re-eval: 05/18/12 Assessment Diagnosis: s/p R RCR  Authorization: workers Pharmacist, community Time Period:    Authorization Visit#:   of    Subjective Symptoms/Limitations Symptoms: S:  I am hot hurting in a resting position but I do feel a pull at times. Pain Assessment Currently in Pain?: No/denies Pain Score: 0-No pain  Precautions/Restrictions     Exercise/Treatments Supine Protraction: PROM;10 reps;Strengthening;15 reps Protraction Weight (lbs): 1 Horizontal ABduction: PROM;10 reps;Strengthening;15 reps Horizontal ABduction Weight (lbs): 1 External Rotation: PROM;10 reps;Strengthening;15 reps External Rotation Weight (lbs): 1 Internal Rotation: PROM;10 reps;Strengthening;15 reps Internal Rotation Weight (lbs): 1 Flexion: PROM;10 reps;Strengthening;15 reps Shoulder Flexion Weight (lbs): 1 ABduction: PROM;10 reps;Strengthening;15 reps Seated Protraction: AROM;15 reps Horizontal ABduction: AROM;15 reps External Rotation: AROM;15 reps Internal Rotation: AROM;15 reps Flexion: AROM Flexion Limitations: limited to 75% Abduction: AROM;15 reps ABduction Limitations: limited to 75% Standing External Rotation: Theraband;15 reps Theraband Level (Shoulder External Rotation): Level 3 (Green) Internal Rotation: Theraband;15 reps Theraband Level (Shoulder Internal Rotation): Level 3 (Green) Extension: Theraband;15 reps Theraband Level (Shoulder Extension): Level 3 (Green) Row: Theraband;15 reps Theraband Level (Shoulder Row): Level 3 (Green) Retraction: Theraband;12 reps;15 reps;10 reps Theraband Level (Shoulder Retraction): Level 3 (Green) ROM / Strengthening / Isometric Strengthening UBE (Upper  Arm Bike): 3' forward 3' backwards 2.5 Wall Wash: 4' with 1# "W" Arms: x 10 with assit to get full range X to V Arms: x 10 with assist to get full range.     Manual Therapy Manual Therapy: Myofascial release Myofascial Release: MFR and manual stretching to right scapular, trapezius, anterior shoulder, and upper arm regions to decrease pain and restrictions and increase pain free mobility.  Occupational Therapy Assessment and Plan OT Assessment and Plan Clinical Impression Statement: A:  Increased wall wash time to 4'.  Patient required assist to get full range of motion with x to v and w arms.  Patient with increased AROM seated today but with occasional shooting pain which limits function. OT Plan: P:  Attempt to increase supine weight.   Goals Short Term Goals Time to Complete Short Term Goals: 4 weeks Short Term Goal 1: Patient will be educated on HEP. Short Term Goal 2: Patient's R shoulder AROM will be assessed. Short Term Goal 3: Patient will increase PROM to WNL to increase ease with bathing and dressing. Short Term Goal 4: Patient will increase right shoulder strength to 3+/5 for increased ability to lift pots and pans to the stove. Short Term Goal 5: Patient will decrease pain to 4/10 when getting in and out of bed. Additional Short Term Goals?: Yes Short Term Goal 6: Patient will decrease fascial restrictions from maximal to moderate in R shoulder region. Long Term Goals Time to Complete Long Term Goals: 8 weeks Long Term Goal 1: Patient will return to PLOF with all B/IADL, work and leisure activities. Long Term Goal 2: Patient will increase AROM of R shoulder to WNL for increased ability to perform work related duties. Long Term Goal 3: Patient will increase right shoulder strength to 5/5 for increased independence with yardwork. Long Term Goal 4: Patient will decrease pain to 2/10 during functional activities. Long Term Goal 5: Patient will have min-trace fascial  restrictions in R shoulder region.  Problem List  Patient Active Problem List  Diagnosis  . Right rotator cuff tear  . Muscle weakness (generalized)  . Pain in joint, shoulder region    End of Session Activity Tolerance: Patient tolerated treatment well General Behavior During Session: [facility] for tasks performed Cognition: [facility] for tasks performed  GO   Deontaye Civello L. Sandrea Boer, COTA/L  04/22/2012, 3:50 PM

## 2012-04-23 ENCOUNTER — Ambulatory Visit (HOSPITAL_COMMUNITY): Payer: Self-pay | Admitting: Specialist

## 2012-04-27 ENCOUNTER — Ambulatory Visit (HOSPITAL_COMMUNITY)
Admission: RE | Admit: 2012-04-27 | Discharge: 2012-04-27 | Disposition: A | Discharge status: Home / Self Care | Payer: Worker's Compensation | Source: Ambulatory Visit | Attending: Orthopedic Surgery | Admitting: Orthopedic Surgery

## 2012-04-27 DIAGNOSIS — M6281 Muscle weakness (generalized): Secondary | ICD-10-CM

## 2012-04-27 DIAGNOSIS — M25519 Pain in unspecified shoulder: Secondary | ICD-10-CM

## 2012-04-27 NOTE — Progress Notes (Signed)
Occupational Therapy Treatment Patient Details  Name: Taylor Bean MRN: [id number] Date of Birth: 04-Oct-1940  Today's Date: 04/27/2012 Time: 4098-1191 OT Time Calculation (min): 59 min Manual Therapy 150-217 27' Therapeutic Exercise 218-249 31'  Visit#: 25  of 32   Re-eval: 05/18/12 Assessment Diagnosis: s/p R RCR  Authorization: workers Pharmacist, community Time Period:    Authorization Visit#:   of    Subjective Symptoms/Limitations Symptoms: S:  I'm good Pain Assessment Currently in Pain?: No/denies Pain Score: 0-No pain  Precautions/Restrictions     Exercise/Treatments Supine Protraction: PROM;10 reps;Strengthening;15 reps Protraction Weight (lbs): 1 Horizontal ABduction: PROM;10 reps;Strengthening;15 reps Horizontal ABduction Weight (lbs): 1 External Rotation: PROM;10 reps;Strengthening;15 reps External Rotation Weight (lbs): 1 Internal Rotation: PROM;10 reps;Strengthening;15 reps Internal Rotation Weight (lbs): 1 Flexion: PROM;10 reps;Strengthening;15 reps Shoulder Flexion Weight (lbs): 1 ABduction: PROM;10 reps;Strengthening;15 reps Shoulder ABduction Weight (lbs): 1 Seated Protraction: AROM;15 reps Horizontal ABduction: AROM;15 reps External Rotation: AROM;15 reps Internal Rotation: AROM;15 reps Flexion: AROM;15 reps Flexion Limitations: limited to 75% Abduction: AROM;15 reps ABduction Limitations: assist to keep arm in extension with abduction to decrease compensation Standing External Rotation: Theraband;15 reps Theraband Level (Shoulder External Rotation): Level 3 (Green) Internal Rotation: Theraband;15 reps Theraband Level (Shoulder Internal Rotation): Level 3 (Green) Extension: Theraband;15 reps Theraband Level (Shoulder Extension): Level 3 (Green) Row: Theraband;15 reps Theraband Level (Shoulder Row): Level 3 (Green) Retraction: Theraband;12 reps;15 reps;10 reps Theraband Level (Shoulder Retraction): Level 3 (Green) ROM /  Strengthening / Isometric Strengthening Wall Wash: 4' with 1# "W" Arms: x 10 with no assist and increase ROM X to V Arms: x10 with no assist today and increase ROM     Manual Therapy Manual Therapy: Myofascial release Myofascial Release: MFR and manual stretching to right scapular, trapezius, anterior shoulder, and upper arm regions to decrease pain and restrictions and increase pain free mobility  Occupational Therapy Assessment and Plan OT Assessment and Plan Clinical Impression Statement: A:  Increased AROM in supine to about 90%.  Flexion continues to be limited. OT Plan: P:  Attempt to increse supine weight   Goals Short Term Goals Time to Complete Short Term Goals: 4 weeks Short Term Goal 1: Patient will be educated on HEP. Short Term Goal 2: Patient's R shoulder AROM will be assessed. Short Term Goal 3: Patient will increase PROM to WNL to increase ease with bathing and dressing. Short Term Goal 4: Patient will increase right shoulder strength to 3+/5 for increased ability to lift pots and pans to the stove. Short Term Goal 5: Patient will decrease pain to 4/10 when getting in and out of bed. Additional Short Term Goals?: Yes Short Term Goal 6: Patient will decrease fascial restrictions from maximal to moderate in R shoulder region. Long Term Goals Time to Complete Long Term Goals: 8 weeks Long Term Goal 1: Patient will return to PLOF with all B/IADL, work and leisure activities. Long Term Goal 2: Patient will increase AROM of R shoulder to WNL for increased ability to perform work related duties. Long Term Goal 3: Patient will increase right shoulder strength to 5/5 for increased independence with yardwork. Long Term Goal 4: Patient will decrease pain to 2/10 during functional activities. Long Term Goal 5: Patient will have min-trace fascial restrictions in R shoulder region.  Problem List Patient Active Problem List  Diagnosis  . Right rotator cuff tear  . Muscle  weakness (generalized)  . Pain in joint, shoulder region    End of Session Activity Tolerance: Patient tolerated  treatment well General Behavior During Session: [facility] for tasks performed Cognition: [facility] for tasks performed  GO  Akaash Vandewater L. Victoria Henshaw, COTA/L  04/27/2012, 3:44 PM

## 2012-04-28 ENCOUNTER — Ambulatory Visit (HOSPITAL_COMMUNITY): Payer: Self-pay | Admitting: Occupational Therapy

## 2012-04-29 ENCOUNTER — Ambulatory Visit (HOSPITAL_COMMUNITY)
Admission: RE | Admit: 2012-04-29 | Discharge: 2012-04-29 | Disposition: A | Discharge status: Home / Self Care | Payer: Worker's Compensation | Source: Ambulatory Visit | Attending: Orthopedic Surgery | Admitting: Orthopedic Surgery

## 2012-04-29 DIAGNOSIS — M6281 Muscle weakness (generalized): Secondary | ICD-10-CM

## 2012-04-29 DIAGNOSIS — M25519 Pain in unspecified shoulder: Secondary | ICD-10-CM

## 2012-04-29 NOTE — Progress Notes (Signed)
Occupational Therapy Treatment Patient Details  Name: DAVANTE GERKE MRN: [id number] Date of Birth: 07/22/1940  Today's Date: 04/29/2012 Time: 1350-1440 OT Time Calculation (min): 50 min Manual Therapy 150-211 21' Therapeutic Exercise 212-240 28'  Visit#: 26  of 32   Re-eval: 05/18/12 Assessment Diagnosis: s/p R RCR  Authorization: workers Pharmacist, community Time Period:    Authorization Visit#:   of    Subjective Symptoms/Limitations Symptoms: S:  I only hurt in certain positions.      Exercise/Treatments Supine Protraction: PROM;10 reps;Strengthening;15 reps Protraction Weight (lbs): 1 Horizontal ABduction: PROM;10 reps;Strengthening;15 reps Horizontal ABduction Weight (lbs): 1 External Rotation: PROM;10 reps;Strengthening;15 reps External Rotation Weight (lbs): 1 Internal Rotation: PROM;10 reps;Strengthening;15 reps Internal Rotation Weight (lbs): 1 Flexion: PROM;10 reps;Strengthening;15 reps Shoulder Flexion Weight (lbs): 1 ABduction: PROM;10 reps;Strengthening;15 reps Shoulder ABduction Weight (lbs): 1 Seated Protraction: AROM;15 reps Horizontal ABduction: AROM;15 reps External Rotation: AROM;15 reps Internal Rotation: AROM;15 reps Flexion: AROM;15 reps Abduction: AROM;15 reps Standing External Rotation: Theraband;15 reps Theraband Level (Shoulder External Rotation): Level 3 (Green) Internal Rotation: Theraband;15 reps Theraband Level (Shoulder Internal Rotation): Level 3 (Green) Extension: Theraband;15 reps Theraband Level (Shoulder Extension): Level 3 (Green) Row: Theraband;15 reps Theraband Level (Shoulder Row): Level 3 (Green) Retraction: Theraband;12 reps;15 reps;10 reps Theraband Level (Shoulder Retraction): Level 3 (Green) ROM / Strengthening / Isometric Strengthening UBE (Upper Arm Bike): 3' forward 3' backwards 3.5 Wall Wash: time "W" Arms: x 10 with no assist and increase ROM X to V Arms: x10 with no assist today and increase  ROM     Manual Therapy Manual Therapy: Myofascial release Myofascial Release: MFR and manual stretching to right scapular, trapezius, anterior shoulder, and upper arm regions to decrease pain and restrictions and increase pain free mobility   Occupational Therapy Assessment and Plan OT Assessment and Plan Clinical Impression Statement: A:  Kept weight at 1# secondary to Flexion continues to be limited in supine and seated.  Abduction in seated continues to be painful Rehab Potential: Good OT Plan: P:  Add tband to HEP   Goals Short Term Goals Time to Complete Short Term Goals: 4 weeks Short Term Goal 1: Patient will be educated on HEP. Short Term Goal 2: Patient's R shoulder AROM will be assessed. Short Term Goal 3: Patient will increase PROM to WNL to increase ease with bathing and dressing. Short Term Goal 4: Patient will increase right shoulder strength to 3+/5 for increased ability to lift pots and pans to the stove. Short Term Goal 5: Patient will decrease pain to 4/10 when getting in and out of bed. Additional Short Term Goals?: Yes Short Term Goal 6: Patient will decrease fascial restrictions from maximal to moderate in R shoulder region. Long Term Goals Time to Complete Long Term Goals: 8 weeks Long Term Goal 1: Patient will return to PLOF with all B/IADL, work and leisure activities. Long Term Goal 2: Patient will increase AROM of R shoulder to WNL for increased ability to perform work related duties. Long Term Goal 3: Patient will increase right shoulder strength to 5/5 for increased independence with yardwork. Long Term Goal 4: Patient will decrease pain to 2/10 during functional activities. Long Term Goal 5: Patient will have min-trace fascial restrictions in R shoulder region.  Problem List Patient Active Problem List  Diagnosis  . Right rotator cuff tear  . Muscle weakness (generalized)  . Pain in joint, shoulder region    End of Session Activity Tolerance:  Patient tolerated treatment well General Behavior During  Session: [facility] for tasks performed Cognition: [facility] for tasks performed  GO   Blessing Zaucha L. Raechelle Sarti, COTA/L  04/29/2012, 2:47 PM

## 2012-04-30 ENCOUNTER — Ambulatory Visit (HOSPITAL_COMMUNITY): Payer: Self-pay | Admitting: Specialist

## 2012-05-04 ENCOUNTER — Ambulatory Visit (HOSPITAL_COMMUNITY)
Admission: RE | Admit: 2012-05-04 | Discharge: 2012-05-04 | Disposition: A | Discharge status: Home / Self Care | Payer: Worker's Compensation | Source: Ambulatory Visit | Attending: Orthopedic Surgery | Admitting: Orthopedic Surgery

## 2012-05-04 DIAGNOSIS — M25519 Pain in unspecified shoulder: Secondary | ICD-10-CM

## 2012-05-04 DIAGNOSIS — M6281 Muscle weakness (generalized): Secondary | ICD-10-CM

## 2012-05-04 NOTE — Progress Notes (Addendum)
Occupational Therapy Treatment Patient Details  Name: JAGDEEP ANCHETA MRN: [id number] Date of Birth: [birth year]  Today's Date: 05/04/2012 Time: 4098-1191 OT Time Calculation (min): 44 min Manual Therapy 147-210 23' Therapeutic Exercise 211-231 20'  Visit#: 27  of 32   Re-eval: 05/18/12    Authorization: workers Pharmacist, community Time Period:    Authorization Visit#:   of    Subjective Symptoms/Limitations Symptoms: S:  It hurt right in the middle of my upper arm.  it lights me up when it hurts.  Precautions/Restrictions     Exercise/Treatments Supine Protraction: PROM;10 reps;Strengthening;15 reps Protraction Weight (lbs): 2 Horizontal ABduction: PROM;10 reps;Strengthening;15 reps Horizontal ABduction Weight (lbs): 2 External Rotation: PROM;10 reps;Strengthening;15 reps External Rotation Weight (lbs): 2 Internal Rotation: PROM;10 reps;Strengthening;15 reps Internal Rotation Weight (lbs): 2 Flexion: PROM;10 reps;Strengthening;15 reps Shoulder Flexion Weight (lbs): 2 ABduction: PROM;10 reps;Strengthening;15 reps Shoulder ABduction Weight (lbs): 2 Standing External Rotation: Other (comment) (time resume next session)   ROM / Strengthening / Isometric Strengthening UBE (Upper Arm Bike): 3' forward 3' backwards 3.5 Wall Wash: 4' with 1# "W" Arms: time X to V Arms: time                Manual Therapy Manual Therapy: Myofascial release Myofascial Release: MFR and manual stretching to right scapular, trapezius, anterior shoulder, and upper arm regions to decrease pain and restrictions and increase pain free mobility   Occupational Therapy Assessment and Plan OT Assessment and Plan Clinical Impression Statement: A:  Increased supine weight to 2# which patient fournd difficult at onset but got easier with increased reps. Rehab Potential: Good OT Plan: P:  Add tband to HEP   Goals Short Term Goals Time to Complete Short Term Goals: 4 weeks Short Term  Goal 1: Patient will be educated on HEP. Short Term Goal 2: Patient's R shoulder AROM will be assessed. Short Term Goal 3: Patient will increase PROM to WNL to increase ease with bathing and dressing. Short Term Goal 4: Patient will increase right shoulder strength to 3+/5 for increased ability to lift pots and pans to the stove. Short Term Goal 5: Patient will decrease pain to 4/10 when getting in and out of bed. Additional Short Term Goals?: Yes Short Term Goal 6: Patient will decrease fascial restrictions from maximal to moderate in R shoulder region. Long Term Goals Time to Complete Long Term Goals: 8 weeks Long Term Goal 1: Patient will return to PLOF with all B/IADL, work and leisure activities. Long Term Goal 2: Patient will increase AROM of R shoulder to WNL for increased ability to perform work related duties. Long Term Goal 3: Patient will increase right shoulder strength to 5/5 for increased independence with yardwork. Long Term Goal 4: Patient will decrease pain to 2/10 during functional activities. Long Term Goal 5: Patient will have min-trace fascial restrictions in R shoulder region.  Problem List Patient Active Problem List  Diagnosis  . Right rotator cuff tear  . Muscle weakness (generalized)  . Pain in joint, shoulder region    End of Session Activity Tolerance: Patient tolerated treatment well General Behavior During Session: [facility] for tasks performed Cognition: [facility] for tasks performed  GO    [name], Pluma Diniz L 05/04/2012, 5:19 PM

## 2012-05-06 ENCOUNTER — Ambulatory Visit (HOSPITAL_COMMUNITY)
Admission: RE | Admit: 2012-05-06 | Discharge: 2012-05-06 | Disposition: A | Discharge status: Home / Self Care | Payer: Worker's Compensation | Source: Ambulatory Visit | Attending: Orthopedic Surgery | Admitting: Orthopedic Surgery

## 2012-05-06 DIAGNOSIS — M25519 Pain in unspecified shoulder: Secondary | ICD-10-CM

## 2012-05-06 DIAGNOSIS — M6281 Muscle weakness (generalized): Secondary | ICD-10-CM

## 2012-05-06 NOTE — Progress Notes (Signed)
Occupational Therapy Treatment Patient Details  Name: ROHEN PLATTNER MRN: [id number] Date of Birth: 03-21-1941  Today's Date: 05/06/2012 Time: 4098-1191 OT Time Calculation (min): 50 min Manual Therapy 145-203  18' Therapeutic Exercise 204-235 31'  Visit#: 28  of 32   Re-eval: 05/18/12    Authorization: workers Pharmacist, community Time Period:    Authorization Visit#:   of    Subjective Symptoms/Limitations Symptoms: S: My arm is better but I can tell it is just not right.  Precautions/Restrictions     Exercise/Treatments Supine Protraction: PROM;10 reps;Strengthening;15 reps Protraction Weight (lbs): 2 Horizontal ABduction: PROM;10 reps;Strengthening;15 reps Horizontal ABduction Weight (lbs): 2 External Rotation: PROM;10 reps;Strengthening;15 reps External Rotation Weight (lbs): 2 Internal Rotation: PROM;10 reps;Strengthening;15 reps Internal Rotation Weight (lbs): 2 Flexion: PROM;10 reps;Strengthening;15 reps Shoulder Flexion Weight (lbs): 2 ABduction: PROM;10 reps;Strengthening;15 reps Shoulder ABduction Weight (lbs): 2 Seated Protraction: AROM;15 reps Horizontal ABduction: AROM;15 reps External Rotation: AROM;15 reps Internal Rotation: AROM;15 reps Flexion: AROM;15 reps Abduction: AROM;15 reps Standing External Rotation:  (time, resume next visit and issue to HEP) ROM / Strengthening / Isometric Strengthening UBE (Upper Arm Bike): 3' forward 3' backwards 3.5 Wall Wash: 4' with 1# "W" Arms: x10 X to V Arms: x10      Manual Therapy Manual Therapy: Myofascial release Myofascial Release: MFR and manual stretching to right scapular, trapezius, anterior shoulder, and upper arm regions to decrease pain and restrictions and increase pain free mobility   Occupational Therapy Assessment and Plan OT Assessment and Plan Clinical Impression Statement: A:  Increased reps with supine ex.  Wanted to add 1# to seated ex but patient still with a mild difficulty  getting full painfree AROM OT Plan: P:  Add tband to HEP   Goals Short Term Goals Time to Complete Short Term Goals: 4 weeks Short Term Goal 1: Patient will be educated on HEP. Short Term Goal 2: Patient's R shoulder AROM will be assessed. Short Term Goal 3: Patient will increase PROM to WNL to increase ease with bathing and dressing. Short Term Goal 4: Patient will increase right shoulder strength to 3+/5 for increased ability to lift pots and pans to the stove. Short Term Goal 5: Patient will decrease pain to 4/10 when getting in and out of bed. Additional Short Term Goals?: Yes Short Term Goal 6: Patient will decrease fascial restrictions from maximal to moderate in R shoulder region. Long Term Goals Time to Complete Long Term Goals: 8 weeks Long Term Goal 1: Patient will return to PLOF with all B/IADL, work and leisure activities. Long Term Goal 2: Patient will increase AROM of R shoulder to WNL for increased ability to perform work related duties. Long Term Goal 3: Patient will increase right shoulder strength to 5/5 for increased independence with yardwork. Long Term Goal 4: Patient will decrease pain to 2/10 during functional activities. Long Term Goal 5: Patient will have min-trace fascial restrictions in R shoulder region.  Problem List Patient Active Problem List  Diagnosis  . Right rotator cuff tear  . Muscle weakness (generalized)  . Pain in joint, shoulder region    End of Session Activity Tolerance: Patient tolerated treatment well General Behavior During Session: [facility] for tasks performed Cognition: [facility] for tasks performed  GO    Avyukt Cimo L. Alfredia Desanctis, COTA/L  05/06/2012, 5:31 PM

## 2012-05-11 ENCOUNTER — Ambulatory Visit (HOSPITAL_COMMUNITY)
Admission: RE | Admit: 2012-05-11 | Discharge: 2012-05-11 | Disposition: A | Discharge status: Home / Self Care | Payer: Worker's Compensation | Source: Ambulatory Visit | Attending: Orthopedic Surgery | Admitting: Orthopedic Surgery

## 2012-05-11 DIAGNOSIS — M6281 Muscle weakness (generalized): Secondary | ICD-10-CM

## 2012-05-11 DIAGNOSIS — M25519 Pain in unspecified shoulder: Secondary | ICD-10-CM

## 2012-05-11 NOTE — Progress Notes (Signed)
Occupational Therapy Treatment Patient Details  Name: Taylor Bean MRN: [id number] Date of Birth: [birth year]  Today's Date: 05/11/2012 Time: 4782-9562 OT Time Calculation (min): 40 min Manual Therapy 1355-1415 20' Reassessment 1308-6578 Visit#: 42  of 32   Re-eval: 06/08/12    Authorization: workers compensation    Subjective S:  I can tell I am gaining range at waist to shoulder height.  Its above my shoulder range that I am still having difficulty with. Special Tests: UEFI was 45% is currently 55%. Pain Assessment Pain Score: 0-No pain Pain Location: Shoulder Pain Orientation: Right Pain Type: Acute pain  Precautions/Restrictions   progress as tolerated  Exercise/Treatments Supine Protraction: PROM;10 reps Horizontal ABduction: PROM;10 reps External Rotation: PROM;10 reps Internal Rotation: PROM;10 reps Flexion: PROM;10 reps ABduction: PROM;10 reps ROM / Strengthening / Isometric Strengthening     UBE 3' and 3' 4.5    Manual Therapy Manual Therapy: Myofascial release Myofascial Release: MFR and manual stretching to right upper arm, shoulder region, scapular region, and pectoral region to decrease pain and restrictions and increase AROM and PROM.  [id number]  Occupational Therapy Assessment and Plan OT Assessment and Plan Clinical Impression Statement: A:  Please refer to MD progress note.  [name] has increased his functional use of his RUE at waist and shoulder heights.  He is able to reach items that are on the passenger car seat with greater ease.  He continues to have difficulty reaching above shoulder height.   OT Frequency: Min 2X/week OT Duration: 4 weeks OT Plan: P:  Resume missed exercises.  Treatment plan to focus on scapular stability/strengthening, AROM with functional activities above shoulder height (reaching into overhead cabinets, hanging hangers on overhead hooks, etc).     Goals Short Term Goals Time to Complete Short Term Goals: 4 weeks Short  Term Goal 1: Patient will be educated on HEP. Short Term Goal 1 Progress: Met Short Term Goal 2: Patient's R shoulder AROM will be assessed. Short Term Goal 2 Progress: Met Short Term Goal 3: Patient will increase PROM to WNL to increase ease with bathing and dressing. Short Term Goal 3 Progress: Met Short Term Goal 4: Patient will increase right shoulder strength to 3+/5 for increased ability to lift pots and pans to the stove. Short Term Goal 4 Progress: Met Short Term Goal 5: Patient will decrease pain to 4/10 when getting in and out of bed. Short Term Goal 5 Progress: Met Additional Short Term Goals?: Yes Short Term Goal 6: Patient will decrease fascial restrictions from maximal to moderate in R shoulder region. Short Term Goal 6 Progress: Met Long Term Goals Time to Complete Long Term Goals: 8 weeks Long Term Goal 1: Patient will return to PLOF with all B/IADL, work and leisure activities. Long Term Goal 1 Progress: Progressing toward goal Long Term Goal 2: Patient will increase AROM of R shoulder to WNL for increased ability to perform work related duties. Long Term Goal 2 Progress: Progressing toward goal Long Term Goal 3: Patient will increase right shoulder strength to 5/5 for increased independence with yardwork. Long Term Goal 3 Progress: Progressing toward goal Long Term Goal 4: Patient will decrease pain to 2/10 during functional activities. Long Term Goal 4 Progress: Progressing toward goal Long Term Goal 5: Patient will have min-trace fascial restrictions in R shoulder region. Long Term Goal 5 Progress: Progressing toward goal  Problem List Patient Active Problem List  Diagnosis  . Right rotator cuff tear  . Muscle weakness (generalized)  .  Pain in joint, shoulder region    End of Session Activity Tolerance: Patient tolerated treatment well General Behavior During Session: [facility] for tasks performed Cognition: [facility] for tasks performed OT Plan of Care OT Home  Exercise Plan: Educated patient on HEP for tband for scapular strenghthening and stability. Consulted and Agree with Plan of Care: Patient  GO    Shirlean Mylar, OTR/L  05/11/2012, 9:40 PM

## 2012-05-15 ENCOUNTER — Ambulatory Visit (HOSPITAL_COMMUNITY)
Admission: RE | Admit: 2012-05-15 | Discharge: 2012-05-15 | Disposition: A | Discharge status: Home / Self Care | Payer: Worker's Compensation | Source: Ambulatory Visit | Attending: Orthopedic Surgery | Admitting: Orthopedic Surgery

## 2012-05-15 DIAGNOSIS — M25519 Pain in unspecified shoulder: Secondary | ICD-10-CM | POA: Insufficient documentation

## 2012-05-15 DIAGNOSIS — M6281 Muscle weakness (generalized): Secondary | ICD-10-CM | POA: Insufficient documentation

## 2012-05-15 DIAGNOSIS — IMO0001 Reserved for inherently not codable concepts without codable children: Secondary | ICD-10-CM | POA: Insufficient documentation

## 2012-05-15 NOTE — Progress Notes (Signed)
Occupational Therapy Treatment Patient Details  Name: Taylor Bean MRN: [id number] Date of Birth: February 05, 1941  Today's Date: 05/15/2012 Time: 3086-5784 OT Time Calculation (min): 50 min Manual Therapy 929-950 21' Therapeutic Exercise [phone] 28'  Visit#: 30  of 32   Re-eval: 06/08/12    Authorization: workers Pharmacist, community Time Period:    Authorization Visit#:   of    Subjective Symptoms/Limitations Symptoms: S:  I have been having a lot of pain the last few days, the doctor said he expected me to have the pain I am having for a while. Pain Assessment Currently in Pain?: Yes Pain Score:   6 Pain Location: Shoulder Pain Orientation: Right Pain Onset: More than a month ago Pain Frequency: Other (Comment) (with certain movements.)  Precautions/Restrictions     Exercise/Treatments  05/15/12 0900  Shoulder Exercises: Supine  Protraction PROM;10 reps;Strengthening;15 reps  Protraction Weight (lbs) 2  Horizontal ABduction PROM;10 reps;Strengthening;15 reps  Horizontal ABduction Weight (lbs) 2  External Rotation PROM;10 reps;Strengthening;15 reps  External Rotation Weight (lbs) 2  Internal Rotation PROM;10 reps;Strengthening;15 reps  Internal Rotation Weight (lbs) 2  Flexion PROM;10 reps;Strengthening;15 reps  Shoulder Flexion Weight (lbs) 2  ABduction PROM;10 reps;Strengthening;15 reps  Shoulder Exercises: Seated  Protraction AROM;15 reps  Horizontal ABduction AROM;15 reps  External Rotation AROM;15 reps  Internal Rotation AROM;15 reps  Flexion AROM;15 reps (provided eccentric resistance to assist in decreasing pain)  Abduction AROM;15 reps (provided eccentric resistance to assist in decreasing pain.)  Shoulder Exercises: ROM/Strengthening  UBE (Upper Arm Bike) 3' and 3' 4.0  Wall Wash 2' with 11/2  "W" Arms x12  X to V Arms x12        Manual Therapy Manual Therapy: Myofascial release Myofascial Release: MFR and manual stretching to right upper  arm, shoulder region, scapular region, and pectoral region to decrease pain and restrictions and increase AROM and PROM  Occupational Therapy Assessment and Plan OT Assessment and Plan Clinical Impression Statement: A:  Provided eccentric resistance with some of the seated ex to prevent pain. Added 1/2 lb to wall wash..  OT Plan: P:  Add proximal shoulder strengthening supine, reaching into cabinet with weights.  Add IFES if pain persists.   Goals Short Term Goals Time to Complete Short Term Goals: 4 weeks Short Term Goal 1: Patient will be educated on HEP. Short Term Goal 2: Patient's R shoulder AROM will be assessed. Short Term Goal 3: Patient will increase PROM to WNL to increase ease with bathing and dressing. Short Term Goal 4: Patient will increase right shoulder strength to 3+/5 for increased ability to lift pots and pans to the stove. Short Term Goal 5: Patient will decrease pain to 4/10 when getting in and out of bed. Additional Short Term Goals?: Yes Short Term Goal 6: Patient will decrease fascial restrictions from maximal to moderate in R shoulder region. Long Term Goals Time to Complete Long Term Goals: 8 weeks Long Term Goal 1: Patient will return to PLOF with all B/IADL, work and leisure activities. Long Term Goal 2: Patient will increase AROM of R shoulder to WNL for increased ability to perform work related duties. Long Term Goal 3: Patient will increase right shoulder strength to 5/5 for increased independence with yardwork. Long Term Goal 4: Patient will decrease pain to 2/10 during functional activities. Long Term Goal 5: Patient will have min-trace fascial restrictions in R shoulder region.  Problem List Patient Active Problem List  Diagnosis  . Right rotator cuff tear  .  Muscle weakness (generalized)  . Pain in joint, shoulder region    End of Session Activity Tolerance: Patient tolerated treatment well General Behavior During Session: [facility] for tasks  performed Cognition: [facility] for tasks performed  GO    [name], Loveah Like L 05/15/2012, 3:10 PM

## 2012-05-18 ENCOUNTER — Ambulatory Visit (HOSPITAL_COMMUNITY)
Admission: RE | Admit: 2012-05-18 | Discharge: 2012-05-18 | Disposition: A | Discharge status: Home / Self Care | Payer: Worker's Compensation | Source: Ambulatory Visit | Attending: Orthopedic Surgery | Admitting: Orthopedic Surgery

## 2012-05-18 DIAGNOSIS — M25519 Pain in unspecified shoulder: Secondary | ICD-10-CM

## 2012-05-18 DIAGNOSIS — M6281 Muscle weakness (generalized): Secondary | ICD-10-CM

## 2012-05-18 NOTE — Progress Notes (Signed)
Occupational Therapy Treatment Patient Details  Name: ADRIANE GUGLIELMO MRN: [id number] Date of Birth: 08/20/1940  Today's Date: 05/18/2012 Time: 4098-1191 OT Time Calculation (min): 38 min Manual Therapy 106-121 15' Therapeutic Exercise 122-144 22'  Visit#: 31  of 32   Re-eval: 06/08/12    Authorization: workers Pharmacist, community Time Period:    Authorization Visit#:   of    Subjective Symptoms/Limitations Symptoms: S:  I am better than the other day but I still have an episode every now and again. Pain Assessment Currently in Pain?: No/denies Pain Score: 0-No pain  Precautions/Restrictions     Exercise/Treatments Supine Protraction: PROM;10 reps;Strengthening Protraction Weight (lbs): 3 Horizontal ABduction: PROM;10 reps;Strengthening Horizontal ABduction Weight (lbs): 3 External Rotation: PROM;10 reps;Strengthening External Rotation Weight (lbs): 3 Internal Rotation: PROM;10 reps;Strengthening Internal Rotation Weight (lbs): 3 Flexion: PROM;10 reps;Strengthening Shoulder Flexion Weight (lbs): 3 ABduction: PROM;10 reps;Strengthening Shoulder ABduction Weight (lbs): 3 Seated Protraction: Strengthening;10 reps Protraction Weight (lbs): 1 Horizontal ABduction: Strengthening;10 reps Horizontal ABduction Weight (lbs): 1 External Rotation: Strengthening;10 reps External Rotation Weight (lbs): 1 Internal Rotation: Strengthening;10 reps Internal Rotation Weight (lbs): 1 Flexion: Strengthening;10 reps Flexion Limitations: 1 Abduction: Strengthening;10 reps ABduction Limitations: 1 ROM / Strengthening / Isometric Strengthening UBE (Upper Arm Bike): 3' and 3' 4.0 Wall Wash: 3' with 1# "W" Arms: x10 with 1# X to V Arms: x10 with 1#        Manual Therapy Manual Therapy: Myofascial release Myofascial Release: MFR and manual stretching to right upper arm, shoulder region, scapular region, and pectoral region to decrease pain and restrictions and increase AROM  and PROM   Occupational Therapy Assessment and Plan OT Assessment and Plan Clinical Impression Statement: A:  Added 3# to supine ex and 1# to seated ex.  Patient had good AROM with the weights today. OT Plan: P: Add proximal shoulder strengthening supine, reaching into cabinet with weights. Add IFES if pain persists   Goals Short Term Goals Time to Complete Short Term Goals: 4 weeks Short Term Goal 1: Patient will be educated on HEP. Short Term Goal 2: Patient's R shoulder AROM will be assessed. Short Term Goal 3: Patient will increase PROM to WNL to increase ease with bathing and dressing. Short Term Goal 4: Patient will increase right shoulder strength to 3+/5 for increased ability to lift pots and pans to the stove. Short Term Goal 5: Patient will decrease pain to 4/10 when getting in and out of bed. Additional Short Term Goals?: Yes Short Term Goal 6: Patient will decrease fascial restrictions from maximal to moderate in R shoulder region. Long Term Goals Time to Complete Long Term Goals: 8 weeks Long Term Goal 1: Patient will return to PLOF with all B/IADL, work and leisure activities. Long Term Goal 2: Patient will increase AROM of R shoulder to WNL for increased ability to perform work related duties. Long Term Goal 3: Patient will increase right shoulder strength to 5/5 for increased independence with yardwork. Long Term Goal 4: Patient will decrease pain to 2/10 during functional activities. Long Term Goal 5: Patient will have min-trace fascial restrictions in R shoulder region.  Problem List Patient Active Problem List  Diagnosis  . Right rotator cuff tear  . Muscle weakness (generalized)  . Pain in joint, shoulder region    End of Session Activity Tolerance: Patient tolerated treatment well General Behavior During Session: [facility] for tasks performed Cognition: [facility] for tasks performed  GO    [name], Romana Deaton L 05/18/2012, 1:45 PM

## 2012-05-20 ENCOUNTER — Ambulatory Visit (HOSPITAL_COMMUNITY)
Admission: RE | Admit: 2012-05-20 | Discharge: 2012-05-20 | Disposition: A | Discharge status: Home / Self Care | Payer: Worker's Compensation | Source: Ambulatory Visit | Attending: Orthopedic Surgery | Admitting: Orthopedic Surgery

## 2012-05-20 DIAGNOSIS — M25519 Pain in unspecified shoulder: Secondary | ICD-10-CM

## 2012-05-20 DIAGNOSIS — M6281 Muscle weakness (generalized): Secondary | ICD-10-CM

## 2012-05-20 NOTE — Progress Notes (Signed)
Occupational Therapy Treatment Patient Details  Name: Taylor Bean MRN: [id number] Date of Birth: 05-04-1941  Today's Date: 05/20/2012 Time: 2130-8657 OT Time Calculation (min): 41 min Manual Therapy [id number] 14' Therapeutic Exercises [phone] 27' Visit#: 32  of 40   Re-eval: 06/08/12    Authorization: workers compensation    Subjective S: Im still having intermittent pain in my right shoulder, its not really changing Pain Assessment Currently in Pain?: Yes Pain Score:   1 Pain Location: Shoulder Pain Orientation: Right Pain Type: Acute pain  Precautions/Restrictions   progress as tolerated  Exercise/Treatments Supine Protraction: PROM;10 reps;Strengthening Protraction Weight (lbs): 3 Horizontal ABduction: PROM;10 reps;Strengthening Horizontal ABduction Weight (lbs): 3 External Rotation: PROM;10 reps;Strengthening External Rotation Weight (lbs): 3 Internal Rotation: PROM;10 reps;Strengthening Internal Rotation Weight (lbs): 3 Flexion: PROM;10 reps;Strengthening Shoulder Flexion Weight (lbs): 3 ABduction: PROM;10 reps;Strengthening Shoulder ABduction Weight (lbs): 3 Seated Protraction: Strengthening;12 reps Protraction Weight (lbs): 1 Horizontal ABduction: Strengthening;12 reps Horizontal ABduction Weight (lbs): 1 External Rotation: Strengthening;12 reps External Rotation Weight (lbs): 1 Internal Rotation: Strengthening;12 reps Internal Rotation Weight (lbs): 1 Flexion: Strengthening;12 reps Flexion Limitations: 1 Abduction: Strengthening;12 reps ABduction Limitations: 1 ROM / Strengthening / Isometric Strengthening UBE (Upper Arm Bike): 3' and 3' 4.5 Wall Wash: omit this visit "W" Arms: 12 reps with 1# X to V Arms: 12 reps with 1# Proximal Shoulder Strengthening, Seated: 10 reps with 1# without rest between each exercise of sequence Other ROM/Strengthening Exercises: scapular depression with green tband.      Manual Therapy Manual Therapy: Joint  mobilization Joint Mobilization: in supine with towel under scapula mobs to posterior shoulder  Myofascial Release: MFR and manual stretching to right upper arm, shoulder region, scapular region, and pectoral region to decrease pain and restrictions and increase AROM and PROM   Occupational Therapy Assessment and Plan OT Assessment and Plan Clinical Impression Statement: A: Demonstates good form with supine strengthening exercises.  Added shoulder joint mobs in supine for increased mobility with funcitonal activities. With vg to visualize a bar that he must reach under, able to maintain depressed scapula. OT Plan: P:  Increase reps and with proximal shoulder strengthening.  Add functional strengthening exercises such as reaching into overhead cabinet with weight on wrist.     Goals Short Term Goals Time to Complete Short Term Goals: 4 weeks Short Term Goal 1: Patient will be educated on HEP. Short Term Goal 2: Patient's R shoulder AROM will be assessed. Short Term Goal 3: Patient will increase PROM to WNL to increase ease with bathing and dressing. Short Term Goal 4: Patient will increase right shoulder strength to 3+/5 for increased ability to lift pots and pans to the stove. Short Term Goal 5: Patient will decrease pain to 4/10 when getting in and out of bed. Additional Short Term Goals?: Yes Short Term Goal 6: Patient will decrease fascial restrictions from maximal to moderate in R shoulder region. Long Term Goals Time to Complete Long Term Goals: 8 weeks Long Term Goal 1: Patient will return to PLOF with all B/IADL, work and leisure activities. Long Term Goal 1 Progress: Progressing toward goal Long Term Goal 2: Patient will increase AROM of R shoulder to WNL for increased ability to perform work related duties. Long Term Goal 2 Progress: Progressing toward goal Long Term Goal 3: Patient will increase right shoulder strength to 5/5 for increased independence with yardwork. Long Term  Goal 3 Progress: Progressing toward goal Long Term Goal 4: Patient will decrease pain to 2/10 during functional  activities. Long Term Goal 4 Progress: Progressing toward goal Long Term Goal 5: Patient will have min-trace fascial restrictions in R shoulder region. Long Term Goal 5 Progress: Progressing toward goal  Problem List Patient Active Problem List  Diagnosis  . Right rotator cuff tear  . Muscle weakness (generalized)  . Pain in joint, shoulder region    End of Session Activity Tolerance: Patient tolerated treatment well General Behavior During Session: [facility] for tasks performed Cognition: [facility] for tasks performed  GO    Shirlean Mylar, OTR/L  05/20/2012, 1:50 PM

## 2012-05-25 ENCOUNTER — Ambulatory Visit (HOSPITAL_COMMUNITY)
Admission: RE | Admit: 2012-05-25 | Discharge: 2012-05-25 | Disposition: A | Discharge status: Home / Self Care | Payer: BC Managed Care – PPO | Source: Ambulatory Visit | Attending: Family Medicine | Admitting: Family Medicine

## 2012-05-25 DIAGNOSIS — IMO0001 Reserved for inherently not codable concepts without codable children: Secondary | ICD-10-CM | POA: Insufficient documentation

## 2012-05-25 DIAGNOSIS — M6281 Muscle weakness (generalized): Secondary | ICD-10-CM

## 2012-05-25 DIAGNOSIS — M25519 Pain in unspecified shoulder: Secondary | ICD-10-CM

## 2012-05-25 NOTE — Progress Notes (Signed)
Occupational Therapy Treatment Patient Details  Name: Taylor Bean MRN: [id number] Date of Birth: [birth year]  Today's Date: 05/25/2012 Time: 1191-4782 OT Time Calculation (min): 45 min Manual Therapy 146-204 18' Therapeutic Exercise 205-231 26'  Visit#: 33  of 40   Re-eval: 06/08/12 Assessment Diagnosis: s/p R RCR  Authorization: workers compensation   Authorization Time Period: authorization from [employer] 18 visits through 07/03/12  Authorization Visit#:   of    Subjective Symptoms/Limitations Symptoms: S: It was too much the last time, I couldn't even lift my arm. Pain Assessment Currently in Pain?: Yes Pain Score:   1  Precautions/Restrictions     Exercise/Treatments Supine Protraction: PROM;10 reps;Strengthening Protraction Weight (lbs): 3 Horizontal ABduction: PROM;10 reps;Strengthening Horizontal ABduction Weight (lbs): 3 External Rotation: PROM;10 reps;Strengthening External Rotation Weight (lbs): 3 Internal Rotation: PROM;10 reps;Strengthening Internal Rotation Weight (lbs): 3 Flexion: PROM;10 reps;Strengthening Shoulder Flexion Weight (lbs): 3 ABduction: PROM;10 reps;Strengthening Shoulder ABduction Weight (lbs): 3 Seated Protraction: Strengthening;15 reps Protraction Weight (lbs): 1 Horizontal ABduction: Strengthening;15 reps Horizontal ABduction Weight (lbs): 1 External Rotation: Strengthening;15 reps External Rotation Weight (lbs): 1 Internal Rotation: Strengthening;15 reps Internal Rotation Weight (lbs): 1 Flexion: Strengthening;15 reps Flexion Limitations: 1 Abduction: Strengthening;15 reps ABduction Limitations: 1 ROM / Strengthening / Isometric Strengthening UBE (Upper Arm Bike): 3' and 3' 4.5 Wall Wash: 3' with 1# "W" Arms: 12 reps with 1# X to V Arms: 12 reps with 1# Proximal Shoulder Strengthening, Seated: x15 Other ROM/Strengthening Exercises: omit this visit     Manual Therapy Manual Therapy: Myofascial  release Myofascial Release: MFR and manual stretching to right upper arm, shoulder region, scapular region, and pectoral region to decrease pain and restrictions and increase AROM and PROM  Occupational Therapy Assessment and Plan OT Assessment and Plan Clinical Impression Statement: A:  Movements much more fulid and normal looking, decreased cues to keep shoulder depressed.   OT Plan: P:  Resume missed ex secondary to time and add reaching into overhead cabinet with weight on wrist.   Goals Short Term Goals Time to Complete Short Term Goals: 4 weeks Short Term Goal 1: Patient will be educated on HEP. Short Term Goal 2: Patient's R shoulder AROM will be assessed. Short Term Goal 3: Patient will increase PROM to WNL to increase ease with bathing and dressing. Short Term Goal 4: Patient will increase right shoulder strength to 3+/5 for increased ability to lift pots and pans to the stove. Short Term Goal 5: Patient will decrease pain to 4/10 when getting in and out of bed. Additional Short Term Goals?: Yes Short Term Goal 6: Patient will decrease fascial restrictions from maximal to moderate in R shoulder region. Long Term Goals Time to Complete Long Term Goals: 8 weeks Long Term Goal 1: Patient will return to PLOF with all B/IADL, work and leisure activities. Long Term Goal 2: Patient will increase AROM of R shoulder to WNL for increased ability to perform work related duties. Long Term Goal 3: Patient will increase right shoulder strength to 5/5 for increased independence with yardwork. Long Term Goal 4: Patient will decrease pain to 2/10 during functional activities. Long Term Goal 5: Patient will have min-trace fascial restrictions in R shoulder region.  Problem List Patient Active Problem List  Diagnosis  . Right rotator cuff tear  . Muscle weakness (generalized)  . Pain in joint, shoulder region    End of Session Activity Tolerance: Patient tolerated treatment  well General Behavior During Session: [facility] for tasks performed Cognition: [facility] for  tasks performed  GO    [name], Ausha Sieh L 05/25/2012, 2:34 PM

## 2012-05-27 ENCOUNTER — Ambulatory Visit (HOSPITAL_COMMUNITY)
Admission: RE | Admit: 2012-05-27 | Discharge: 2012-05-27 | Disposition: A | Discharge status: Home / Self Care | Payer: Worker's Compensation | Source: Ambulatory Visit | Attending: Orthopedic Surgery | Admitting: Orthopedic Surgery

## 2012-05-27 DIAGNOSIS — IMO0001 Reserved for inherently not codable concepts without codable children: Secondary | ICD-10-CM | POA: Insufficient documentation

## 2012-05-27 DIAGNOSIS — M25619 Stiffness of unspecified shoulder, not elsewhere classified: Secondary | ICD-10-CM | POA: Insufficient documentation

## 2012-05-27 DIAGNOSIS — M6281 Muscle weakness (generalized): Secondary | ICD-10-CM | POA: Insufficient documentation

## 2012-05-27 DIAGNOSIS — M25519 Pain in unspecified shoulder: Secondary | ICD-10-CM | POA: Insufficient documentation

## 2012-05-27 NOTE — Progress Notes (Signed)
Occupational Therapy Treatment Patient Details  Name: Taylor Bean MRN: [id number] Date of Birth: 1940-09-06  Today's Date: 05/27/2012 Time: 8657-8469 OT Time Calculation (min): 48 min Manual Therapy 103-127 24' Therapeutic Exercise 128-151 23'  Visit#: 34  of 40   Re-eval: 06/08/12    Authorization: workers compensation   Authorization Time Period: authorization from [name] 18 visits through 07/03/12   Authorization Visit#:   of    Subjective Symptoms/Limitations Symptoms: S:  It is sore, It is painful at time with certian positions. Pain Assessment Currently in Pain?: Yes Pain Score:   1 Pain Orientation: Right Pain Type: Acute pain Pain Onset: More than a month ago Pain Frequency: Other (Comment) (pain increases to 10+ in certain positions)  Precautions/Restrictions     Exercise/Treatments Supine Protraction: PROM;10 reps;Strengthening Protraction Weight (lbs): 3 Horizontal ABduction: PROM;10 reps;Strengthening Horizontal ABduction Weight (lbs): 3 External Rotation: PROM;10 reps;Strengthening External Rotation Weight (lbs): 3 Internal Rotation: PROM;10 reps;Strengthening Internal Rotation Weight (lbs): 3 Flexion: PROM;10 reps;Strengthening Shoulder Flexion Weight (lbs): 3 ABduction: Other (comment) (hold secondary to causing pain) Seated Protraction: Strengthening;10 reps Protraction Weight (lbs): 2 Horizontal ABduction: Strengthening;10 reps Horizontal ABduction Weight (lbs): 2 External Rotation: Strengthening;10 reps External Rotation Weight (lbs): 2 Internal Rotation: Strengthening;10 reps Internal Rotation Weight (lbs): 2 Flexion: Other (comment) (hold secondary to pain) Abduction:  (hold secondary to pain.) ROM / Strengthening / Isometric Strengthening "W" Arms: hold today X to V Arms: hold today Proximal Shoulder Strengthening, Seated: x15 Ball on Wall: x2' flexion and abd     Manual Therapy Manual Therapy: Myofascial  release Myofascial Release: MFR and manual stretching to right upper arm, shoulder region, scapular region, and pectoral region to decrease pain and restrictions and increase AROM and PROM   Occupational Therapy Assessment and Plan OT Assessment and Plan Clinical Impression Statement: A:  Patient with some increase pain with certain movements, hold on the movements that gave him stabbing pain today (see flow sheet for exact ones). Switched from wall wash to ball on wall to increase functional endurance. OT Plan: P:  Add reaching overhead into cabinet.   Goals Short Term Goals Time to Complete Short Term Goals: 4 weeks Short Term Goal 1: Patient will be educated on HEP. Short Term Goal 2: Patient's R shoulder AROM will be assessed. Short Term Goal 3: Patient will increase PROM to WNL to increase ease with bathing and dressing. Short Term Goal 4: Patient will increase right shoulder strength to 3+/5 for increased ability to lift pots and pans to the stove. Short Term Goal 5: Patient will decrease pain to 4/10 when getting in and out of bed. Additional Short Term Goals?: Yes Short Term Goal 6: Patient will decrease fascial restrictions from maximal to moderate in R shoulder region. Long Term Goals Time to Complete Long Term Goals: 8 weeks Long Term Goal 1: Patient will return to PLOF with all B/IADL, work and leisure activities. Long Term Goal 2: Patient will increase AROM of R shoulder to WNL for increased ability to perform work related duties. Long Term Goal 3: Patient will increase right shoulder strength to 5/5 for increased independence with yardwork. Long Term Goal 4: Patient will decrease pain to 2/10 during functional activities. Long Term Goal 5: Patient will have min-trace fascial restrictions in R shoulder region.  Problem List Patient Active Problem List  Diagnosis  . Right rotator cuff tear  . Muscle weakness (generalized)  . Pain in joint, shoulder region    End of  Session Activity Tolerance: Patient tolerated treatment well General Behavior During Session: [facility] for tasks performed Cognition: [facility] for tasks performed  GO    [name], Kearstin Learn L 05/27/2012, 2:58 PM

## 2012-06-01 ENCOUNTER — Ambulatory Visit (HOSPITAL_COMMUNITY)
Admission: RE | Admit: 2012-06-01 | Discharge: 2012-06-01 | Disposition: A | Discharge status: Home / Self Care | Payer: BC Managed Care – PPO | Source: Ambulatory Visit | Attending: Orthopedic Surgery | Admitting: Orthopedic Surgery

## 2012-06-01 DIAGNOSIS — M25519 Pain in unspecified shoulder: Secondary | ICD-10-CM

## 2012-06-01 DIAGNOSIS — M6281 Muscle weakness (generalized): Secondary | ICD-10-CM

## 2012-06-01 NOTE — Progress Notes (Signed)
Occupational Therapy Treatment Patient Details  Name: GEAN LAROSE MRN: [id number] Date of Birth: 21-Mar-1941  Today's Date: 06/01/2012 Time: 4132-4401 OT Time Calculation (min): 54 min Manual Therapy 101-122 21' Therapeutic Exercise 123-155 32'  Visit#: 35  of 40   Re-eval: 06/08/12    Authorization: workers compensation   Authorization Time Period: authorization from [employer] 18 visits through 07/03/12  Authorization Visit#:   of    Subjective  Symptoms/Limitations  Symptoms: S: It's the same ole same old Pain Assessment  Currently in Pain?: Yes  Pain Score: 1  Pain Orientation: Right  Pain Type: Acute pain  Pain Onset: More than a month ago  Pain Frequency: Other (Comment) (pain increases to 10+ in certain positions)     Precautions/Restrictions     Exercise/Treatments Supine Protraction: PROM;10 reps;Strengthening;15 reps Protraction Weight (lbs): 3 Horizontal ABduction: PROM;10 reps;Strengthening;15 reps Horizontal ABduction Weight (lbs): 3 External Rotation: PROM;10 reps;Strengthening;15 reps External Rotation Weight (lbs): 3 Internal Rotation: PROM;10 reps;Strengthening;15 reps Internal Rotation Weight (lbs): 3 Flexion: PROM;10 reps;Strengthening Shoulder Flexion Weight (lbs): 3 ABduction: PROM;10 reps;Strengthening;15 reps Shoulder ABduction Weight (lbs): 3 Seated Protraction: Strengthening;12 reps Protraction Weight (lbs): 2 Horizontal ABduction: Strengthening;10 reps Horizontal ABduction Weight (lbs): 2 External Rotation: Strengthening;10 reps External Rotation Weight (lbs): 2 Internal Rotation: Strengthening;10 reps Internal Rotation Weight (lbs): 2 Flexion: Strengthening;12 reps Flexion Weight (lbs): 2 Flexion Limitations: with assit through painful arch Abduction: Strengthening;10 reps ABduction Weight (lbs): 2 ABduction Limitations: with assist through painful arch Standing Other Standing Exercises: shoulder depression with  green x 12 Other Standing Exercises: standing at cabinet reaching overhead into cabinet with weight. 2# x10reps ROM / Strengthening / Isometric Strengthening UBE (Upper Arm Bike): 3' and 3' 4.5 Wall Wash: 3' with 1# "W" Arms: x10 with 2# X to V Arms: x10 with 2# Proximal Shoulder Strengthening, Seated: x20 Ball on Wall: x2' flexion and abd Other ROM/Strengthening Exercises: scapular depression with green tband x 15       Manual Therapy Manual Therapy: Myofascial release Myofascial Release: MFR and manual stretching to right upper arm, shoulder region, scapular region, and pectoral region to decrease pain and restrictions and increase AROM and PROM  Occupational Therapy Assessment and Plan OT Assessment and Plan Clinical Impression Statement: A:  Added reaching into overhead cabinet with 2lb weight, patient completed without pain or c/o fatigue. Rehab Potential: Good OT Plan: P: Increase weight with cabinet work to 3#   Goals Short Term Goals Time to Complete Short Term Goals: 4 weeks Short Term Goal 1: Patient will be educated on HEP. Short Term Goal 2: Patient's R shoulder AROM will be assessed. Short Term Goal 3: Patient will increase PROM to WNL to increase ease with bathing and dressing. Short Term Goal 4: Patient will increase right shoulder strength to 3+/5 for increased ability to lift pots and pans to the stove. Short Term Goal 5: Patient will decrease pain to 4/10 when getting in and out of bed. Additional Short Term Goals?: Yes Short Term Goal 6: Patient will decrease fascial restrictions from maximal to moderate in R shoulder region. Long Term Goals Time to Complete Long Term Goals: 8 weeks Long Term Goal 1: Patient will return to PLOF with all B/IADL, work and leisure activities. Long Term Goal 2: Patient will increase AROM of R shoulder to WNL for increased ability to perform work related duties. Long Term Goal 3: Patient will increase right shoulder strength to 5/5  for increased independence with yardwork. Long Term Goal 4:  Patient will decrease pain to 2/10 during functional activities. Long Term Goal 5: Patient will have min-trace fascial restrictions in R shoulder region.  Problem List Patient Active Problem List  Diagnosis  . Right rotator cuff tear  . Muscle weakness (generalized)  . Pain in joint, shoulder region    End of Session Activity Tolerance: Patient tolerated treatment well General Behavior During Session: [facility] for tasks performed Cognition: [facility] for tasks performed  GO    [name], Maely Clements L 06/01/2012, 2:33 PM

## 2012-06-03 ENCOUNTER — Ambulatory Visit (HOSPITAL_COMMUNITY): Payer: Self-pay | Admitting: Specialist

## 2012-06-08 ENCOUNTER — Ambulatory Visit (HOSPITAL_COMMUNITY)
Admission: RE | Admit: 2012-06-08 | Discharge: 2012-06-08 | Disposition: A | Discharge status: Home / Self Care | Payer: Worker's Compensation | Source: Ambulatory Visit | Attending: Orthopedic Surgery | Admitting: Orthopedic Surgery

## 2012-06-08 DIAGNOSIS — M6281 Muscle weakness (generalized): Secondary | ICD-10-CM

## 2012-06-08 DIAGNOSIS — M25519 Pain in unspecified shoulder: Secondary | ICD-10-CM

## 2012-06-08 NOTE — Progress Notes (Signed)
Occupational Therapy Treatment Patient Details  Name: Taylor Bean MRN: [id number] Date of Birth: [birth year]  Today's Date: 06/08/2012 Time: 4098-1191 OT Time Calculation (min): 53 min Manual Therapy 106-121 15' Reassess 122-132 10' Therapeutic Exercise 133-159 77'  Visit#: 36  of 40   Re-eval: 07/03/12    Authorization: workers compensation   Authorization Time Period: authorization from [employer] 18 visits through 07/03/12   Authorization Visit#:   of    Subjective Symptoms/Limitations Symptoms: S:  I just get pains in certain movements, if I adjust, its better, and I can go about my day.   Pain Assessment Currently in Pain?: No/denies Pain Score: 0-No pain Pain Orientation: Right Pain Type: Acute pain Pain Radiating Towards: with movement it can 10/10  Precautions/Restrictions     Exercise/Treatments Supine Protraction: PROM;Strengthening;15 reps Protraction Weight (lbs): 3 Horizontal ABduction: PROM;Strengthening;15 reps Horizontal ABduction Weight (lbs): 3 External Rotation: PROM;Strengthening;15 reps External Rotation Weight (lbs): 3 Internal Rotation: PROM;Strengthening;15 reps Internal Rotation Weight (lbs): 3 Flexion: PROM;Strengthening;15 reps Shoulder Flexion Weight (lbs): 3 ABduction: PROM;Strengthening;15 reps Shoulder ABduction Weight (lbs): 3 Seated Protraction: Strengthening;15 reps Protraction Weight (lbs): 2 Horizontal ABduction: Strengthening;15 reps Horizontal ABduction Weight (lbs): 2 External Rotation: Strengthening;15 reps (with shoulder at 90 degrees abduction) External Rotation Weight (lbs): 1 Internal Rotation: Strengthening;15 reps Internal Rotation Weight (lbs): 2 Flexion: Strengthening;15 reps Flexion Weight (lbs): 2 Abduction: Strengthening;15 reps ABduction Weight (lbs): 2 Standing Other Standing Exercises: time Other Standing Exercises: time Therapy Ball Right/Left: Other (comment) (in standing using weighted  green ball 5 right 5 left.) ROM / Strengthening / Isometric Strengthening UBE (Upper Arm Bike): 3' and 3' 4.5 Wall Wash: time "W" Arms: time X to V Arms: time Proximal Shoulder Strengthening, Seated: time Ball on Wall: time Other ROM/Strengthening Exercises: time        Manual Therapy Manual Therapy: Myofascial release Myofascial Release: MFR and manual stretching to right upper arm, shoulder region, scapular region, and pectoral region to decrease pain and restrictions and increase AROM and PROM   Occupational Therapy Assessment and Plan OT Assessment and Plan Clinical Impression Statement: A:  See progress note.  Added ball circles with weighted ball and towel stretch and IR/ER from 90 degrees abduction. OT Plan: P: Increase weight with cabinet work to 3#   Goals Short Term Goals Time to Complete Short Term Goals: 4 weeks Short Term Goal 1: Patient will be educated on HEP. Short Term Goal 1 Progress: Met Short Term Goal 2: Patient's R shoulder AROM will be assessed. Short Term Goal 2 Progress: Met Short Term Goal 3: Patient will increase PROM to WNL to increase ease with bathing and dressing. Short Term Goal 3 Progress: Met Short Term Goal 4: Patient will increase right shoulder strength to 3+/5 for increased ability to lift pots and pans to the stove. Short Term Goal 4 Progress: Met Short Term Goal 5: Patient will decrease pain to 4/10 when getting in and out of bed. Short Term Goal 5 Progress: Met Additional Short Term Goals?: Yes Short Term Goal 6: Patient will decrease fascial restrictions from maximal to moderate in R shoulder region. Short Term Goal 6 Progress: Met Long Term Goals Time to Complete Long Term Goals: 8 weeks Long Term Goal 1: Patient will return to PLOF with all B/IADL, work and leisure activities. Long Term Goal 1 Progress: Progressing toward goal Long Term Goal 2: Patient will increase AROM of R shoulder to WNL for increased ability to perform work  related duties. Long Term  Goal 2 Progress: Progressing toward goal Long Term Goal 3: Patient will increase right shoulder strength to 5/5 for increased independence with yardwork. Long Term Goal 3 Progress: Progressing toward goal Long Term Goal 4: Patient will decrease pain to 2/10 during functional activities. Long Term Goal 4 Progress: Progressing toward goal Long Term Goal 5: Patient will have min-trace fascial restrictions in R shoulder region. Long Term Goal 5 Progress: Met  Problem List Patient Active Problem List  Diagnosis  . Right rotator cuff tear  . Muscle weakness (generalized)  . Pain in joint, shoulder region    End of Session Activity Tolerance: Patient tolerated treatment well General Behavior During Session: [facility] for tasks performed Cognition: [facility] for tasks performed  GO    [name], Zamaya Rapaport L 06/08/2012, 2:23 PM

## 2012-06-10 ENCOUNTER — Ambulatory Visit (HOSPITAL_COMMUNITY)
Admission: RE | Admit: 2012-06-10 | Discharge: 2012-06-10 | Disposition: A | Discharge status: Home / Self Care | Payer: Worker's Compensation | Source: Ambulatory Visit | Attending: Orthopedic Surgery | Admitting: Orthopedic Surgery

## 2012-06-10 DIAGNOSIS — M6281 Muscle weakness (generalized): Secondary | ICD-10-CM

## 2012-06-10 DIAGNOSIS — M25519 Pain in unspecified shoulder: Secondary | ICD-10-CM

## 2012-06-10 NOTE — Progress Notes (Signed)
Occupational Therapy Treatment Patient Details  Name: VERNON MAISH MRN: [id number] Date of Birth: March 09, 1941  Today's Date: 06/10/2012 Time: 6213-0865 OT Time Calculation (min): 48 min Manual Therapy 106-125 19' Therapeutic Exercise 126-154 28'  Visit#: 37  of 40   Re-eval: 07/03/12    Authorization: workers compensation   Authorization Time Period: authorization from [employer] 18 visits through 07/03/12   Authorization Visit#:   of    Subjective Symptoms/Limitations Symptoms: S:  Majority of the time I don't have any pain. Pain Assessment Currently in Pain?: No/denies  Precautions/Restrictions     Exercise/Treatments Supine Protraction: PROM;Strengthening;15 reps Protraction Weight (lbs): 4 Horizontal ABduction: PROM;Strengthening;15 reps Horizontal ABduction Weight (lbs): 4 External Rotation: PROM;Strengthening;15 reps External Rotation Weight (lbs): 4 Internal Rotation: PROM;Strengthening;15 reps Internal Rotation Weight (lbs): 4 Flexion: PROM;Strengthening;15 reps Shoulder Flexion Weight (lbs): 4 ABduction: PROM;Strengthening;15 reps Shoulder ABduction Weight (lbs): 4 Seated Protraction: Strengthening;15 reps Protraction Weight (lbs): 2 Horizontal ABduction: Strengthening;15 reps Horizontal ABduction Weight (lbs): 2 External Rotation: Strengthening;15 reps External Rotation Weight (lbs): 2 Internal Rotation: Strengthening;15 reps Internal Rotation Weight (lbs): 2 Flexion: Strengthening;15 reps Flexion Weight (lbs): 2 Abduction: Strengthening;15 reps ABduction Weight (lbs): 2 Therapy Ball Right/Left: Other (comment) ( (in standing using weighted green ball 5 right 5 left.)) ROM / Strengthening / Isometric Strengthening UBE (Upper Arm Bike): 3' and 3' 5.0 Wall Wash: 3' with 1# "W" Arms: x10 with 2# X to V Arms: x10 with 2# Proximal Shoulder Strengthening, Seated: x10 with 2# Ball on Wall: 2' flexion 2' abduction     Manual Therapy Manual  Therapy: Myofascial release Myofascial Release: MFR and manual stretching to right upper arm, shoulder region, scapular region, and pectoral region to decrease pain and restrictions and increase AROM and PROM  Occupational Therapy Assessment and Plan OT Assessment and Plan Clinical Impression Statement: A:  Increased to supine to 4#, added 2# to proximal shoulder strength and increase to 3# with cabinet reach. OT Plan: P:  Increase reps with supine exercises and attempt to increase weight with seated ex.   Goals Short Term Goals Time to Complete Short Term Goals: 4 weeks Short Term Goal 1: Patient will be educated on HEP. Short Term Goal 2: Patient's R shoulder AROM will be assessed. Short Term Goal 3: Patient will increase PROM to WNL to increase ease with bathing and dressing. Short Term Goal 4: Patient will increase right shoulder strength to 3+/5 for increased ability to lift pots and pans to the stove. Short Term Goal 5: Patient will decrease pain to 4/10 when getting in and out of bed. Additional Short Term Goals?: Yes Short Term Goal 6: Patient will decrease fascial restrictions from maximal to moderate in R shoulder region. Long Term Goals Time to Complete Long Term Goals: 8 weeks Long Term Goal 1: Patient will return to PLOF with all B/IADL, work and leisure activities. Long Term Goal 2: Patient will increase AROM of R shoulder to WNL for increased ability to perform work related duties. Long Term Goal 3: Patient will increase right shoulder strength to 5/5 for increased independence with yardwork. Long Term Goal 4: Patient will decrease pain to 2/10 during functional activities. Long Term Goal 5: Patient will have min-trace fascial restrictions in R shoulder region.  Problem List Patient Active Problem List  Diagnosis  . Right rotator cuff tear  . Muscle weakness (generalized)  . Pain in joint, shoulder region    End of Session Activity Tolerance: Patient tolerated  treatment well General Behavior  During Session: [facility] for tasks performed Cognition: [facility] for tasks performed  GO    [name], Malillany Kazlauskas L 06/10/2012, 2:06 PM

## 2012-06-15 ENCOUNTER — Ambulatory Visit (HOSPITAL_COMMUNITY)
Admission: RE | Admit: 2012-06-15 | Discharge: 2012-06-15 | Disposition: A | Discharge status: Home / Self Care | Payer: Worker's Compensation | Source: Ambulatory Visit | Attending: Family Medicine | Admitting: Family Medicine

## 2012-06-15 DIAGNOSIS — M6281 Muscle weakness (generalized): Secondary | ICD-10-CM

## 2012-06-15 DIAGNOSIS — IMO0001 Reserved for inherently not codable concepts without codable children: Secondary | ICD-10-CM | POA: Insufficient documentation

## 2012-06-15 DIAGNOSIS — M25519 Pain in unspecified shoulder: Secondary | ICD-10-CM

## 2012-06-15 NOTE — Progress Notes (Signed)
Occupational Therapy Treatment Patient Details  Name: Taylor Bean MRN: [id number] Date of Birth: [birth year]  Today's Date: 06/15/2012 Time: 1310-1406 OT Time Calculation (min): 56 min Manual Therapy 110-137 17' Therapeutic Exercise 138-206 28' Visit#: 60  of 40   Re-eval: 07/03/12    Authorization: workers compensation   Authorization Time Period: authorization from [employer] 18 visits through 07/03/12   Authorization Visit#:   of    Subjective Symptoms/Limitations Symptoms: S:  I think they are wanting me to start doing more at work.  I'm not sure if this arm is ready yet. Pain Assessment Currently in Pain?: No/denies Pain Score: 0-No pain  Precautions/Restrictions     Exercise/Treatments Supine Protraction: PROM;Strengthening;12 reps Protraction Weight (lbs): 4 Horizontal ABduction: PROM;Strengthening;12 reps Horizontal ABduction Weight (lbs): 4 External Rotation: PROM;Strengthening;12 reps External Rotation Weight (lbs): 4 Internal Rotation: PROM;Strengthening;12 reps Internal Rotation Weight (lbs): 4 Flexion: PROM;Strengthening;12 reps Shoulder Flexion Weight (lbs): 4 ABduction: PROM;Strengthening;12 reps Shoulder ABduction Weight (lbs): 4 Seated Protraction: Strengthening;10 reps Protraction Weight (lbs): 3 Horizontal ABduction: Strengthening;10 reps Horizontal ABduction Weight (lbs): 3 External Rotation: Strengthening;10 reps External Rotation Weight (lbs): 3 Internal Rotation: Strengthening;10 reps Internal Rotation Weight (lbs): 3 Flexion: Strengthening;10 reps Flexion Weight (lbs): 3 Abduction: Strengthening;10 reps ABduction Weight (lbs): 3 Standing Other Standing Exercises: shoulder depression with green x 12 Other Standing Exercises: resume Therapy Ball Right/Left: Other (comment) (with red weighted ball) ROM / Strengthening / Isometric Strengthening UBE (Upper Arm Bike): 3' and 3' 5.0 Wall Wash: d/c doing ball on wall "W" Arms: x10  with 3# X to V Arms: x10 with 3# Proximal Shoulder Strengthening, Seated: x10 with 3# Ball on Wall: 2' flexion 2' abduction with red weighted ball Graduated Retraction with Theraband: x5 Sustained Retraction with Theraband: x5 Other ROM/Strengthening Exercises: scapular depression with green tband x 15      Manual Therapy Manual Therapy: Myofascial release Myofascial Release: MFR and manual stretching to right upper arm, shoulder region, scapular region, and pectoral region to decrease pain and restrictions and increase AROM and PROM   Occupational Therapy Assessment and Plan OT Assessment and Plan Clinical Impression Statement: A:  Increased seated to 3#, d/c'd wall wash secondary to doing ball on wall with weighted ball.  Also added progressive tband.  Rehab Potential: Good OT Plan: P:  Resume reaching into cabinet with weight.   Goals Short Term Goals Time to Complete Short Term Goals: 4 weeks Short Term Goal 1: Patient will be educated on HEP. Short Term Goal 2: Patient's R shoulder AROM will be assessed. Short Term Goal 3: Patient will increase PROM to WNL to increase ease with bathing and dressing. Short Term Goal 4: Patient will increase right shoulder strength to 3+/5 for increased ability to lift pots and pans to the stove. Short Term Goal 5: Patient will decrease pain to 4/10 when getting in and out of bed. Additional Short Term Goals?: Yes Short Term Goal 6: Patient will decrease fascial restrictions from maximal to moderate in R shoulder region. Long Term Goals Time to Complete Long Term Goals: 8 weeks Long Term Goal 1: Patient will return to PLOF with all B/IADL, work and leisure activities. Long Term Goal 2: Patient will increase AROM of R shoulder to WNL for increased ability to perform work related duties. Long Term Goal 3: Patient will increase right shoulder strength to 5/5 for increased independence with yardwork. Long Term Goal 4: Patient will decrease pain to  2/10 during functional activities. Long Term Goal 5:  Patient will have min-trace fascial restrictions in R shoulder region.  Problem List Patient Active Problem List  Diagnosis  . Right rotator cuff tear  . Muscle weakness (generalized)  . Pain in joint, shoulder region    End of Session Activity Tolerance: Patient tolerated treatment well General Behavior During Session: [facility] for tasks performed Cognition: [facility] for tasks performed  GO Jacqualyn Sedgwick L. [name], COTA/L    06/15/2012, 2:18 PM

## 2012-06-17 ENCOUNTER — Ambulatory Visit (HOSPITAL_COMMUNITY)
Admission: RE | Admit: 2012-06-17 | Discharge: 2012-06-17 | Disposition: A | Discharge status: Home / Self Care | Payer: Worker's Compensation | Source: Ambulatory Visit | Attending: Orthopedic Surgery | Admitting: Orthopedic Surgery

## 2012-06-17 DIAGNOSIS — M25519 Pain in unspecified shoulder: Secondary | ICD-10-CM

## 2012-06-17 DIAGNOSIS — M6281 Muscle weakness (generalized): Secondary | ICD-10-CM

## 2012-06-17 NOTE — Progress Notes (Signed)
Occupational Therapy Treatment Patient Details  Name: Taylor Bean MRN: [id number] Date of Birth: December 26, 1940  Today's Date: 06/17/2012 Time: 4098-1191 OT Time Calculation (min): 55 min Manual Therapy  [id number] 16' Therapeutic Exercises [phone] 19' Visit#: 45  of 48   Re-eval: 07/03/12    Authorization: workers compensation   Authorization Time Period: authorization from [employer] 18 visits through 07/03/12   Authorization Visit#:   of    Subjective S:  When I am just resting, it doesnt hurt at all.  When I use it or you work on it, my pain can go up to a 8/10. Pain Assessment Currently in Pain?: No/denies Pain Score: 0-No pain  Precautions/Restrictions   progress as tolerated  Exercise/Treatments Supine Protraction: PROM;10 reps;Strengthening;15 reps Protraction Weight (lbs): 4 Horizontal ABduction: PROM;10 reps;Strengthening;15 reps Horizontal ABduction Weight (lbs): 4 External Rotation: PROM;10 reps;Strengthening;15 reps External Rotation Weight (lbs): 4 Internal Rotation: PROM;10 reps;Strengthening;15 reps Internal Rotation Weight (lbs): 4 Flexion: PROM;10 reps;Strengthening;15 reps Shoulder Flexion Weight (lbs): 4 ABduction: PROM;10 reps;Strengthening;15 reps Shoulder ABduction Weight (lbs): 4 Standing Protraction: Strengthening;10 reps Protraction Weight (lbs): 3 Horizontal ABduction: Strengthening;10 reps Horizontal ABduction Weight (lbs): 3 External Rotation: Strengthening;10 reps (90 abd) External Rotation Weight (lbs): 3 Internal Rotation: Strengthening;10 reps (90 abd) Internal Rotation Weight (lbs): 3 Flexion: Strengthening;10 reps Shoulder Flexion Weight (lbs): 3 ABduction: Strengthening;10 reps Shoulder ABduction Weight (lbs): 3 Other Standing Exercises: shoulder depression with green x 15 Other Standing Exercises: reaching into overhead cabinets with 3# weight x 15 reps Therapy Ball Right/Left:  (reaching behind back at pocket level  to reach for ball x 15) ROM / Strengthening / Isometric Strengthening UBE (Upper Arm Bike): 3' and 3' 5.0 Cybex Press: 1 plate;15 reps Cybex Row: 1 plate;15 reps "W" Arms: resume next visit X to V Arms: resume next visit Proximal Shoulder Strengthening, Seated: x 10 holding red weighted ball without resting between positions Ball on Wall: 2' flexion 2' abduction with red weighted ball      Manual Therapy Manual Therapy: Myofascial release Myofascial Release: MFR and manual stretching to right upper arm, shoulder region, scapular region, and pectoral region to decrease pain and restrictions and increase AROM and PROM 1411 1427  Occupational Therapy Assessment and Plan OT Assessment and Plan Clinical Impression Statement: A: Transitioned to completing strengthening in standing rather than seated, as patient tends to lean back in chair when seated. In standing, patient able to go through 90% range without compensatory movements. OT Plan: P:  Increase weight on cybex row and press in order to increase scapular stability and periscapular strength.   Goals Short Term Goals Time to Complete Short Term Goals: 4 weeks Short Term Goal 1: Patient will be educated on HEP. Short Term Goal 2: Patient's R shoulder AROM will be assessed. Short Term Goal 3: Patient will increase PROM to WNL to increase ease with bathing and dressing. Short Term Goal 4: Patient will increase right shoulder strength to 3+/5 for increased ability to lift pots and pans to the stove. Short Term Goal 5: Patient will decrease pain to 4/10 when getting in and out of bed. Additional Short Term Goals?: Yes Short Term Goal 6: Patient will decrease fascial restrictions from maximal to moderate in R shoulder region. Long Term Goals Time to Complete Long Term Goals: 8 weeks Long Term Goal 1: Patient will return to PLOF with all B/IADL, work and leisure activities. Long Term Goal 1 Progress: Progressing toward goal Long Term Goal  2: Patient will increase  AROM of R shoulder to WNL for increased ability to perform work related duties. Long Term Goal 2 Progress: Progressing toward goal Long Term Goal 3: Patient will increase right shoulder strength to 5/5 for increased independence with yardwork. Long Term Goal 3 Progress: Progressing toward goal Long Term Goal 4: Patient will decrease pain to 2/10 during functional activities. Long Term Goal 4 Progress: Progressing toward goal Long Term Goal 5: Patient will have min-trace fascial restrictions in R shoulder region. Long Term Goal 5 Progress: Progressing toward goal  Problem List Patient Active Problem List  Diagnosis  . Right rotator cuff tear  . Muscle weakness (generalized)  . Pain in joint, shoulder region    End of Session Activity Tolerance: Patient tolerated treatment well General Behavior During Session: [facility] for tasks performed Cognition: [facility] for tasks performed  GO    Shirlean Mylar, OTR/L  06/17/2012, 3:02 PM

## 2012-06-22 ENCOUNTER — Ambulatory Visit (HOSPITAL_COMMUNITY)
Admission: RE | Admit: 2012-06-22 | Discharge: 2012-06-22 | Disposition: A | Discharge status: Home / Self Care | Payer: Worker's Compensation | Source: Ambulatory Visit | Attending: Orthopedic Surgery | Admitting: Orthopedic Surgery

## 2012-06-24 ENCOUNTER — Ambulatory Visit (HOSPITAL_COMMUNITY)
Admission: RE | Admit: 2012-06-24 | Discharge: 2012-06-24 | Disposition: A | Discharge status: Home / Self Care | Payer: Worker's Compensation | Source: Ambulatory Visit | Attending: Orthopedic Surgery | Admitting: Orthopedic Surgery

## 2012-06-24 DIAGNOSIS — M6281 Muscle weakness (generalized): Secondary | ICD-10-CM

## 2012-06-24 DIAGNOSIS — M25519 Pain in unspecified shoulder: Secondary | ICD-10-CM

## 2012-06-24 NOTE — Progress Notes (Signed)
Occupational Therapy Treatment Patient Details  Name: Taylor Bean MRN: [id number] Date of Birth: 11/24/1940  Today's Date: 06/24/2012 Time: 1303-1400 OT Time Calculation (min): 57 min Therapeutic Exercise 103-140 37' Manual Therapy 141-200 19'  Visit#: 40  of 48   Re-eval: 07/03/12 Assessment Diagnosis: s/p R RCR  Authorization: workers [employer] Time Period: authorization from [employer] 18 visits through 07/03/12   Authorization Visit#:   of    Subjective Symptoms/Limitations Symptoms: S:  I want to get this arm better" Pain Assessment Currently in Pain?: No/denies Pain Score: 0-No pain  Precautions/Restrictions     Exercise/Treatments Supine Protraction: PROM;10 reps;Strengthening;15 reps Protraction Weight (lbs): 4 Horizontal ABduction: PROM;10 reps;Strengthening;15 reps Horizontal ABduction Weight (lbs): 4 External Rotation: PROM;10 reps;Strengthening;15 reps External Rotation Weight (lbs): 4 Internal Rotation: PROM;10 reps;Strengthening;15 reps Internal Rotation Weight (lbs): 4 Flexion: PROM;10 reps;Strengthening;15 reps Shoulder Flexion Weight (lbs): 4 ABduction: PROM;10 reps;Strengthening;15 reps Shoulder ABduction Weight (lbs): 4 Standing Protraction: Strengthening;12 reps Protraction Weight (lbs): 3 Horizontal ABduction: Strengthening;12 reps Horizontal ABduction Weight (lbs): 3 External Rotation: Strengthening;12 reps External Rotation Weight (lbs): 3 Internal Rotation: Strengthening;12 reps Internal Rotation Weight (lbs): 3 Flexion: Strengthening;12 reps Shoulder Flexion Weight (lbs): 3 ABduction: Strengthening;12 reps Shoulder ABduction Weight (lbs): 3 Other Standing Exercises: shoulder depression with green x 15 Other Standing Exercises: reaching into overhead cabinets with 3# weight x 15 reps ROM / Strengthening / Isometric Strengthening UBE (Upper Arm Bike): 3' and 3' 5.0 Cybex Press: 2 plate;15 reps Cybex Row:  2 plate;15 reps "W" Arms: x10 with 3# X to V Arms: x10 wtih 3# Proximal Shoulder Strengthening, Seated: x 10 holding red weighted ball without resting between positions Ball on Wall: 2' flexion 2' abduction with red weighted ball         Manual Therapy Manual Therapy: Myofascial release Myofascial Release: MFR and manual stretching to right upper arm, shoulder region, scapular region, and pectoral region to decrease pain and restrictions and increase AROM and PROM   Occupational Therapy Assessment and Plan OT Assessment and Plan Clinical Impression Statement: A: Continued exercises in standing to prevent compensatory tech.  PROM supine remains a bit limited with flexion. OT Plan: P:  Increase reps and weight as patient can tolerate.   Goals Short Term Goals Time to Complete Short Term Goals: 4 weeks Short Term Goal 1: Patient Taylor be educated on HEP. Short Term Goal 2: Patient's R shoulder AROM Taylor be assessed. Short Term Goal 3: Patient Taylor increase PROM to WNL to increase ease with bathing and dressing. Short Term Goal 4: Patient Taylor increase right shoulder strength to 3+/5 for increased ability to lift pots and pans to the stove. Short Term Goal 5: Patient Taylor decrease pain to 4/10 when getting in and out of bed. Additional Short Term Goals?: Yes Short Term Goal 6: Patient Taylor decrease fascial restrictions from maximal to moderate in R shoulder region. Long Term Goals Time to Complete Long Term Goals: 8 weeks Long Term Goal 1: Patient Taylor return to PLOF with all B/IADL, work and leisure activities. Long Term Goal 2: Patient Taylor increase AROM of R shoulder to WNL for increased ability to perform work related duties. Long Term Goal 3: Patient Taylor increase right shoulder strength to 5/5 for increased independence with yardwork. Long Term Goal 4: Patient Taylor decrease pain to 2/10 during functional activities. Long Term Goal 5: Patient Taylor have min-trace fascial  restrictions in R shoulder region.  Problem List Patient Active Problem List  Diagnosis  . Right rotator cuff tear  . Muscle weakness (generalized)  . Pain in joint, shoulder region    End of Session Activity Tolerance: Patient tolerated treatment well General Behavior During Session: [facility] for tasks performed Cognition: [facility] for tasks performed  GO    [name], Malissia Rabbani L 06/24/2012, 4:34 PM

## 2012-06-25 ENCOUNTER — Ambulatory Visit (HOSPITAL_COMMUNITY)
Admission: RE | Admit: 2012-06-25 | Discharge: 2012-06-25 | Disposition: A | Discharge status: Home / Self Care | Payer: Worker's Compensation | Source: Ambulatory Visit | Attending: Orthopedic Surgery | Admitting: Orthopedic Surgery

## 2012-06-25 DIAGNOSIS — M25519 Pain in unspecified shoulder: Secondary | ICD-10-CM

## 2012-06-25 DIAGNOSIS — M6281 Muscle weakness (generalized): Secondary | ICD-10-CM

## 2012-06-25 NOTE — Progress Notes (Addendum)
Occupational Therapy Treatment Patient Details  Name: Taylor Bean MRN: [id number] Date of Birth: 1940-12-05  Today's Date: 06/25/2012 Time: 6295-2841 OT Time Calculation (min): 56 min Manual Therapy [id number] 15' Therapeutic Exercise 7253-6644    Visit#: 41  of 48   Re-eval: 07/23/12 Assessment Diagnosis: s/p R RCR  Authorization: workers compensation   Authorization Time Period: authorization from [employer] 18 visits through 07/03/12   Authorization Visit#:   of    Subjective Symptoms/Limitations Symptoms: S:  I go to the doctor today. Pain Assessment Currently in Pain?: No/denies Pain Score: 0-No pain  Precautions/Restrictions     Exercise/Treatments Supine Protraction:  (resume next session) Standing Protraction: Strengthening;12 reps Protraction Weight (lbs): 3 Horizontal ABduction: Strengthening;12 reps Horizontal ABduction Weight (lbs): 3 External Rotation: Strengthening;12 reps External Rotation Weight (lbs): 3 Internal Rotation: Strengthening;12 reps Internal Rotation Weight (lbs): 3 Flexion: Strengthening;12 reps Shoulder Flexion Weight (lbs): 3 ABduction: Strengthening;12 reps Shoulder ABduction Weight (lbs): 3 Other Standing Exercises: shoulder depression with green x 15 Other Standing Exercises: reaching onto weight cabinet with 4# Therapy Ball Right/Left:  (reaching behind back at pocket level to reach for ball x 15) ROM / Strengthening / Isometric Strengthening UBE (Upper Arm Bike): 3' and 3' 5.0 Cybex Press: 2.5 plate;15 reps Cybex Row: 2.5 plate;15 reps "W" Arms: x10 with 3# X to V Arms: x10 wtih 3# Proximal Shoulder Strengthening, Seated: x 15 holding red weighted ball without resting between positions Ball on Wall: 2' flexion 2' abduction with red weighted ball Graduated Retraction with Theraband: x5 Sustained Retraction with Theraband: x5 Other ROM/Strengthening Exercises: scapular depression with green tband x 15         Manual Therapy Manual Therapy: Myofascial release Myofascial Release: MFR and manual stretching to right upper arm, shoulder region, scapular region, and pectoral region to decrease pain and restrictions and increase AROM and PROM  Occupational Therapy Assessment and Plan OT Assessment and Plan Clinical Impression Statement: A:  Increased reaching into cabinet to 4# and to reaching to top of weight cabinet. OT Plan: P:  Attempt body blade.   Goals Short Term Goals Time to Complete Short Term Goals: 4 weeks Short Term Goal 1: Patient will be educated on HEP. Short Term Goal 1 Progress: Met Short Term Goal 2: Patient's R shoulder AROM will be assessed. Short Term Goal 3: Patient will increase PROM to WNL to increase ease with bathing and dressing. Short Term Goal 3 Progress: Met Short Term Goal 4: Patient will increase right shoulder strength to 3+/5 for increased ability to lift pots and pans to the stove. Short Term Goal 4 Progress: Met Short Term Goal 5: Patient will decrease pain to 4/10 when getting in and out of bed. Short Term Goal 5 Progress: Met Additional Short Term Goals?: Yes Short Term Goal 6: Patient will decrease fascial restrictions from maximal to moderate in R shoulder region. Short Term Goal 6 Progress: Met Long Term Goals Time to Complete Long Term Goals: 8 weeks Long Term Goal 1: Patient will return to PLOF with all B/IADL, work and leisure activities. Long Term Goal 1 Progress: Progressing toward goal Long Term Goal 2: Patient will increase AROM of R shoulder to WNL for increased ability to perform work related duties. Long Term Goal 2 Progress: Progressing toward goal Long Term Goal 3: Patient will increase right shoulder strength to 5/5 for increased independence with yardwork. Long Term Goal 3 Progress: Progressing toward goal Long Term Goal 4: Patient will decrease pain to 2/10  during functional activities. Long Term Goal 4 Progress: Progressing toward  goal Long Term Goal 5: Patient will have min-trace fascial restrictions in R shoulder region. Long Term Goal 5 Progress: Progressing toward goal  Problem List Patient Active Problem List  Diagnosis  . Right rotator cuff tear  . Muscle weakness (generalized)  . Pain in joint, shoulder region    End of Session Activity Tolerance: Patient tolerated treatment well General Behavior During Session: [facility] for tasks performed Cognition: [facility] for tasks performed  GO    [name], Trequan Marsolek L 06/25/2012, 12:58 PM

## 2012-06-29 ENCOUNTER — Ambulatory Visit (HOSPITAL_COMMUNITY)
Admission: RE | Admit: 2012-06-29 | Discharge: 2012-06-29 | Disposition: A | Discharge status: Home / Self Care | Payer: Worker's Compensation | Source: Ambulatory Visit | Attending: Orthopedic Surgery | Admitting: Orthopedic Surgery

## 2012-06-29 DIAGNOSIS — M25519 Pain in unspecified shoulder: Secondary | ICD-10-CM

## 2012-06-29 DIAGNOSIS — M6281 Muscle weakness (generalized): Secondary | ICD-10-CM

## 2012-06-29 NOTE — Progress Notes (Signed)
Occupational Therapy Treatment Patient Details  Name: Taylor Bean MRN: [id number] Date of Birth: 1941-06-07  Today's Date: 06/29/2012 Time: 3664-4034 OT Time Calculation (min): 52 min Manual Therapy 103-121 18' Therapeutic Exercise 122-155 78'  Visit#: 42  of 48   Re-eval: 07/23/12 Assessment Diagnosis: s/p R RCR  Authorization: workers [employer], [employer] (consulting) Time Period: authorization from [employer] 18 visits through 07/03/12   Authorization Visit#:   of    Subjective Symptoms/Limitations Symptoms: S: The doctor said I am not ready to be released but he is pleased with where I am. Pain Assessment Currently in Pain?: No/denies Pain Score: 0-No pain  Precautions/Restrictions     Exercise/Treatments Supine Protraction: PROM;10 reps;Strengthening;15 reps Protraction Weight (lbs): 4 Horizontal ABduction: PROM;10 reps;Strengthening;15 reps Horizontal ABduction Weight (lbs): 4 External Rotation: PROM;10 reps;Strengthening;15 reps External Rotation Weight (lbs): 4 Internal Rotation: PROM;10 reps;Strengthening;15 reps Internal Rotation Weight (lbs): 4 Flexion: PROM;10 reps;Strengthening;15 reps Shoulder Flexion Weight (lbs): 4 ABduction: PROM;10 reps;Strengthening;15 reps Shoulder ABduction Weight (lbs): 4 Standing Protraction: Strengthening;10 reps Protraction Weight (lbs): 4 Horizontal ABduction: Strengthening;10 reps Horizontal ABduction Weight (lbs): 4 External Rotation: Strengthening;10 reps External Rotation Weight (lbs): 4 Internal Rotation: Strengthening;10 reps Internal Rotation Weight (lbs): 4 Flexion: Strengthening;10 reps Shoulder Flexion Weight (lbs): 4 ABduction: Strengthening;10 reps Shoulder ABduction Weight (lbs): 4 Other Standing Exercises: omit, pt not feeling well Other Standing Exercises: omit, pt.not feeling well. ROM / Strengthening / Isometric Strengthening UBE (Upper Arm Bike): 3' and 3' 5.0 Cybex Press: 3 plate;15  reps Cybex Row: 3 plate;15 reps "W" Arms: x10 with 4# X to V Arms: x10 wtih 4# Proximal Shoulder Strengthening, Seated: omit, pt. did not feel well Ball on Wall: omit, pt. not feeling well. Graduated Retraction with Theraband: omit, pt not feeling well Sustained Retraction with Theraband: omit, pt. not feeling well Other ROM/Strengthening Exercises: omit pt. not feeling well.      Manual Therapy Manual Therapy: Myofascial release Myofascial Release: MFR and manual stretching to right upper arm, shoulder region, scapular region, and pectoral region to decrease pain and restrictions and increase AROM and PROM   Occupational Therapy Assessment and Plan OT Assessment and Plan Clinical Impression Statement: A:  Some exercises not completed secondary to patient having a headache and wanting to go home. OT Plan: P:  Resume missed exercises and attempt body blade.   Goals Short Term Goals Time to Complete Short Term Goals: 4 weeks Short Term Goal 1: Patient will be educated on HEP. Short Term Goal 2: Patient's R shoulder AROM will be assessed. Short Term Goal 3: Patient will increase PROM to WNL to increase ease with bathing and dressing. Short Term Goal 4: Patient will increase right shoulder strength to 3+/5 for increased ability to lift pots and pans to the stove. Short Term Goal 5: Patient will decrease pain to 4/10 when getting in and out of bed. Additional Short Term Goals?: Yes Short Term Goal 6: Patient will decrease fascial restrictions from maximal to moderate in R shoulder region. Long Term Goals Time to Complete Long Term Goals: 8 weeks Long Term Goal 1: Patient will return to PLOF with all B/IADL, work and leisure activities. Long Term Goal 2: Patient will increase AROM of R shoulder to WNL for increased ability to perform work related duties. Long Term Goal 3: Patient will increase right shoulder strength to 5/5 for increased independence with yardwork. Long Term Goal 4:  Patient will decrease pain to 2/10 during functional activities. Long Term Goal 5: Patient will  have min-trace fascial restrictions in R shoulder region.  Problem List Patient Active Problem List  Diagnosis  . Right rotator cuff tear  . Muscle weakness (generalized)  . Pain in joint, shoulder region    End of Session Activity Tolerance: Patient tolerated treatment well General Behavior During Session: [facility] for tasks performed Cognition: [facility] for tasks performed  GO    [name], Kristeena Meineke L 06/29/2012, 3:46 PM

## 2012-07-01 ENCOUNTER — Ambulatory Visit (HOSPITAL_COMMUNITY)
Admission: RE | Admit: 2012-07-01 | Discharge: 2012-07-01 | Disposition: A | Discharge status: Home / Self Care | Payer: Worker's Compensation | Source: Ambulatory Visit | Attending: Orthopedic Surgery | Admitting: Orthopedic Surgery

## 2012-07-01 DIAGNOSIS — M6281 Muscle weakness (generalized): Secondary | ICD-10-CM

## 2012-07-01 DIAGNOSIS — M25519 Pain in unspecified shoulder: Secondary | ICD-10-CM

## 2012-07-01 NOTE — Progress Notes (Signed)
Occupational Therapy Treatment Patient Details  Name: Taylor Bean MRN: [id number] Date of Birth: 07-30-1940  Today's Date: 07/01/2012 Time: 0103-0146 OT Time Calculation (min): 43 min Manual Therapy 103-126 23' Therapeutic Exercise 127-146 19'  Visit#: 64  of 48   Re-eval: 07/23/12 Assessment Diagnosis: s/p R RCR  Authorization: workers compensation   Authorization Time Period: authorization from [employer] 18 visits through 07/03/12   Authorization Visit#:   of    Subjective Symptoms/Limitations Symptoms: S:  It is about the same. Pain Assessment Currently in Pain?: No/denies Pain Score: 0-No pain  Precautions/Restrictions     Exercise/Treatments Supine Protraction: PROM;10 reps;Strengthening;15 reps Protraction Weight (lbs): 4 Horizontal ABduction: PROM;10 reps;Strengthening;15 reps Horizontal ABduction Weight (lbs): 4 External Rotation: PROM;10 reps;Strengthening;15 reps External Rotation Weight (lbs): 4 Internal Rotation: PROM;10 reps;Strengthening;15 reps Internal Rotation Weight (lbs): 4 Flexion: PROM;10 reps;Strengthening;15 reps Shoulder Flexion Weight (lbs): 4 ABduction: PROM;10 reps;Strengthening;15 reps Shoulder ABduction Weight (lbs): 4 Standing Protraction: Strengthening;12 reps Protraction Weight (lbs): 4 Horizontal ABduction: Strengthening;12 reps Horizontal ABduction Weight (lbs): 4 External Rotation: Strengthening;12 reps External Rotation Weight (lbs): 4 Internal Rotation: Strengthening;12 reps Internal Rotation Weight (lbs): 4 Flexion: Strengthening;12 reps Shoulder Flexion Weight (lbs): 4 ABduction: Strengthening;12 reps Shoulder ABduction Weight (lbs): 4 Other Standing Exercises: time Other Standing Exercises: reaching onto weight cabinet with 4# Therapy Ball Right/Left:  (reaching behind back at pocket level to reach for ball x 15) ROM / Strengthening / Isometric Strengthening UBE (Upper Arm Bike): unavailable Cybex Press:  3 plate;15 reps Cybex Row: 3 plate;15 reps "W" Arms: x10 with 4# X to V Arms: x10 wtih 4# Proximal Shoulder Strengthening, Seated: x15 with 4# Ball on Wall: time Graduated Retraction with Theraband: time Sustained Retraction with Theraband: time Other ROM/Strengthening Exercises: time         Manual Therapy Manual Therapy: Myofascial release Myofascial Release: MFR and manual stretching to right upper arm, shoulder region, scapular region, and pectoral region to decrease pain and restrictions and increase AROM and PROM   Occupational Therapy Assessment and Plan OT Assessment and Plan Clinical Impression Statement: A:  Patient tolerated an increase in flexion PROM.  Continues to complete most strengthening in standng to prevent compensatory patterns. OT Plan: P:  Attempt body blade   Goals Short Term Goals Time to Complete Short Term Goals: 4 weeks Short Term Goal 1: Patient will be educated on HEP. Short Term Goal 2: Patient's R shoulder AROM will be assessed. Short Term Goal 3: Patient will increase PROM to WNL to increase ease with bathing and dressing. Short Term Goal 4: Patient will increase right shoulder strength to 3+/5 for increased ability to lift pots and pans to the stove. Short Term Goal 5: Patient will decrease pain to 4/10 when getting in and out of bed. Additional Short Term Goals?: Yes Short Term Goal 6: Patient will decrease fascial restrictions from maximal to moderate in R shoulder region. Long Term Goals Time to Complete Long Term Goals: 8 weeks Long Term Goal 1: Patient will return to PLOF with all B/IADL, work and leisure activities. Long Term Goal 2: Patient will increase AROM of R shoulder to WNL for increased ability to perform work related duties. Long Term Goal 3: Patient will increase right shoulder strength to 5/5 for increased independence with yardwork. Long Term Goal 4: Patient will decrease pain to 2/10 during functional activities. Long Term  Goal 5: Patient will have min-trace fascial restrictions in R shoulder region.  Problem List Patient Active Problem List  Diagnosis  . Right rotator cuff tear  . Muscle weakness (generalized)  . Pain in joint, shoulder region    End of Session Activity Tolerance: Patient tolerated treatment well General Behavior During Session: [facility] for tasks performed Cognition: [facility] for tasks performed  GO    [name], [name] 07/01/2012, 4:33 PM

## 2012-07-06 ENCOUNTER — Ambulatory Visit (HOSPITAL_COMMUNITY)
Admission: RE | Admit: 2012-07-06 | Discharge: 2012-07-06 | Disposition: A | Discharge status: Home / Self Care | Payer: Worker's Compensation | Source: Ambulatory Visit | Attending: Orthopedic Surgery | Admitting: Orthopedic Surgery

## 2012-07-06 DIAGNOSIS — M6281 Muscle weakness (generalized): Secondary | ICD-10-CM

## 2012-07-06 DIAGNOSIS — M25519 Pain in unspecified shoulder: Secondary | ICD-10-CM

## 2012-07-06 NOTE — Progress Notes (Signed)
Occupational Therapy Treatment Patient Details  Name: FABION GATSON MRN: [id number] Date of Birth: 07-04-1941  Today's Date: 07/06/2012 Time: 6213-0865 OT Time Calculation (min): 34 min Manual Therapy 101-124 23' Therapeutic Exercise [phone] 10'  Visit#: 44  of 48   Re-eval: 07/23/12 Assessment Diagnosis: s/p R RCR  Authorization: workers compensation  Authorization Time Period: [name], OTR/L spoke with case manager on 06/26/12 who stated that [facility] would cover visits through 07/23/12  Authorization Visit#:   of    Subjective Symptoms/Limitations Symptoms: S: I need to leave early, I have an appointment Pain Assessment Currently in Pain?: No/denies Pain Score: 0-No pain  Precautions/Restrictions     Exercise/Treatments Supine Protraction: PROM;10 reps;Strengthening;15 reps Protraction Weight (lbs): 4 Horizontal ABduction: PROM;10 reps;Strengthening;15 reps Horizontal ABduction Weight (lbs): 4 External Rotation: PROM;10 reps;Strengthening;15 reps External Rotation Weight (lbs): 4 Internal Rotation: PROM;10 reps;Strengthening;15 reps Internal Rotation Weight (lbs): 4 Flexion: PROM;10 reps;Strengthening;15 reps Shoulder Flexion Weight (lbs): 4 ABduction: PROM;10 reps;Strengthening;15 reps Shoulder ABduction Weight (lbs): 4        Manual Therapy Manual Therapy: Myofascial release Myofascial Release: MFR and manual stretching to right upper arm, shoulder region, scapular region, and pectoral region to decrease pain and restrictions and increase AROM and PROM   Occupational Therapy Assessment and Plan OT Assessment and Plan Clinical Impression Statement: A:  Increase in PROM flexion today.  End range is painful.  Pt. needed to leave early for appointment so some exercises missed. OT Plan: P:  Resume missed exercises and attempt body blade.   Goals Short Term Goals Time to Complete Short Term Goals: 4 weeks Short Term Goal 1: Patient will be educated on  HEP. Short Term Goal 2: Patient's R shoulder AROM will be assessed. Short Term Goal 3: Patient will increase PROM to WNL to increase ease with bathing and dressing. Short Term Goal 4: Patient will increase right shoulder strength to 3+/5 for increased ability to lift pots and pans to the stove. Short Term Goal 5: Patient will decrease pain to 4/10 when getting in and out of bed. Additional Short Term Goals?: Yes Short Term Goal 6: Patient will decrease fascial restrictions from maximal to moderate in R shoulder region. Long Term Goals Time to Complete Long Term Goals: 8 weeks Long Term Goal 1: Patient will return to PLOF with all B/IADL, work and leisure activities. Long Term Goal 2: Patient will increase AROM of R shoulder to WNL for increased ability to perform work related duties. Long Term Goal 3: Patient will increase right shoulder strength to 5/5 for increased independence with yardwork. Long Term Goal 4: Patient will decrease pain to 2/10 during functional activities. Long Term Goal 5: Patient will have min-trace fascial restrictions in R shoulder region.  Problem List Patient Active Problem List  Diagnosis  . Right rotator cuff tear  . Muscle weakness (generalized)  . Pain in joint, shoulder region    End of Session Activity Tolerance: Patient tolerated treatment well General Behavior During Session: [facility] for tasks performed Cognition: [facility] for tasks performed  GO    [name], Lougenia Morrissey L 07/06/2012, 2:32 PM

## 2012-07-13 ENCOUNTER — Ambulatory Visit (HOSPITAL_COMMUNITY)
Admission: RE | Admit: 2012-07-13 | Discharge: 2012-07-13 | Disposition: A | Discharge status: Home / Self Care | Payer: Worker's Compensation | Source: Ambulatory Visit | Attending: Occupational Therapy | Admitting: Occupational Therapy

## 2012-07-13 DIAGNOSIS — M6281 Muscle weakness (generalized): Secondary | ICD-10-CM

## 2012-07-13 DIAGNOSIS — M25519 Pain in unspecified shoulder: Secondary | ICD-10-CM

## 2012-07-13 NOTE — Progress Notes (Signed)
Occupational Therapy Treatment Patient Details  Name: Taylor Bean MRN: [id number] Date of Birth: [birth year]  Today's Date: 07/13/2012 Time: 0101-0146 OT Time Calculation (min): 45 min Manual Therapy 101-126 25' Therapeutic Exercise 127-146 19'  Visit#: 45  of 48   Re-eval: 07/23/12 Assessment Diagnosis: s/p R RCR  Authorization: workers compensation   Authorization Time Period: [name], OTR/L spoke with case manager on 06/26/12 who stated that [facility] would cover visits through 07/23/12  Authorization Visit#:   of    Subjective Symptoms/Limitations Symptoms: S:  I feel like I am getting a flair up in my shoulder. Pain Assessment Currently in Pain?: Yes Pain Score:   5 Pain Location: Shoulder Pain Orientation: Right Pain Type: Acute pain  Precautions/Restrictions     Exercise/Treatments Supine Protraction: PROM;10 reps;Strengthening;15 reps Protraction Weight (lbs): 4 Horizontal ABduction: PROM;10 reps;Strengthening;15 reps Horizontal ABduction Weight (lbs): 4 External Rotation: PROM;10 reps;Strengthening;15 reps External Rotation Weight (lbs): 4 Internal Rotation: PROM;10 reps;Strengthening;15 reps Internal Rotation Weight (lbs): 4 Flexion: PROM;10 reps;Strengthening;15 reps Shoulder Flexion Weight (lbs): 4 ABduction: PROM;10 reps;Strengthening;15 reps Shoulder ABduction Weight (lbs): 4 Standing Protraction: Strengthening;15 reps Protraction Weight (lbs): 4 Horizontal ABduction: Strengthening;15 reps Horizontal ABduction Weight (lbs): 4 External Rotation: Strengthening;15 reps External Rotation Weight (lbs): 4 Internal Rotation: Strengthening;15 reps Internal Rotation Weight (lbs): 4 Flexion: Strengthening;15 reps Shoulder Flexion Weight (lbs): 4 ABduction: Strengthening;15 reps Shoulder ABduction Weight (lbs): 4 Other Standing Exercises: reaching onto weight cabinet with 4# Therapy Ball Right/Left: Other (comment) (tiem) ROM / Strengthening / Isometric  Strengthening UBE (Upper Arm Bike): 3' forward 3' backwards 5.0 Cybex Press: 3 plate;15 reps Cybex Row: 3 plate;15 reps "W" Arms: x15 with 4# X to V Arms: x15 wtih 4# Proximal Shoulder Strengthening, Seated: x15 with 4# Ball on Wall: time Graduated Retraction with Theraband: time Sustained Retraction with Theraband: time Other ROM/Strengthening Exercises: time Body Blade Flexion: 5 reps ABduction: 5 reps External Rotation: 5 reps Internal Rotation: 5 reps     Manual Therapy Manual Therapy: Myofascial release Myofascial Release: MFR and manual stretching to right upper arm, shoulder region, scapular region, and pectoral region to decrease pain and restrictions and increase AROM and PROM  Occupational Therapy Assessment and Plan OT Assessment and Plan Clinical Impression Statement: A:  End range very tight but soft end feel.  Added body blade which pt. performed well.  Some exercises missed secondary to time. OT Plan: P:  Continue to increase PROM to WNL.  Increase supine to 5#.   Goals Short Term Goals Time to Complete Short Term Goals: 4 weeks Short Term Goal 1: Patient will be educated on HEP. Short Term Goal 2: Patient's R shoulder AROM will be assessed. Short Term Goal 3: Patient will increase PROM to WNL to increase ease with bathing and dressing. Short Term Goal 4: Patient will increase right shoulder strength to 3+/5 for increased ability to lift pots and pans to the stove. Short Term Goal 5: Patient will decrease pain to 4/10 when getting in and out of bed. Additional Short Term Goals?: Yes Short Term Goal 6: Patient will decrease fascial restrictions from maximal to moderate in R shoulder region. Long Term Goals Time to Complete Long Term Goals: 8 weeks Long Term Goal 1: Patient will return to PLOF with all B/IADL, work and leisure activities. Long Term Goal 2: Patient will increase AROM of R shoulder to WNL for increased ability to perform work related duties. Long  Term Goal 3: Patient will increase right shoulder strength to 5/5 for  increased independence with yardwork. Long Term Goal 4: Patient will decrease pain to 2/10 during functional activities. Long Term Goal 5: Patient will have min-trace fascial restrictions in R shoulder region.  Problem List Patient Active Problem List  Diagnosis  . Right rotator cuff tear  . Muscle weakness (generalized)  . Pain in joint, shoulder region    End of Session Activity Tolerance: Patient tolerated treatment well General Behavior During Session: [facility] for tasks performed Cognition: [facility] for tasks performed  GO    [name], Adynn Caseres L 07/13/2012, 3:15 PM

## 2012-07-21 ENCOUNTER — Ambulatory Visit (HOSPITAL_COMMUNITY)
Admission: RE | Admit: 2012-07-21 | Discharge: 2012-07-21 | Disposition: A | Discharge status: Home / Self Care | Payer: BC Managed Care – PPO | Source: Ambulatory Visit | Attending: Family Medicine | Admitting: Family Medicine

## 2012-07-21 DIAGNOSIS — M25519 Pain in unspecified shoulder: Secondary | ICD-10-CM | POA: Insufficient documentation

## 2012-07-21 DIAGNOSIS — IMO0001 Reserved for inherently not codable concepts without codable children: Secondary | ICD-10-CM | POA: Insufficient documentation

## 2012-07-21 DIAGNOSIS — M6281 Muscle weakness (generalized): Secondary | ICD-10-CM

## 2012-07-21 NOTE — Progress Notes (Signed)
Occupational Therapy Treatment Patient Details  Name: OSBORN PULLIN MRN: [id number] Date of Birth: 1941-04-14  Today's Date: 07/21/2012 Time: 1355-1450 OT Time Calculation (min): 55 min Manual Therapy 1355-1410 15' Reassessment 4098-1191 18' Therapeutic Exercises 1428-1450 22' Visit#: 23  of 48   Re-eval: 08/18/12    Authorization: workers compensation  Authorization Time Period: [name], OTR/L spoke with case manager on 06/26/12 who stated that [facility] would cover visits through 07/23/12   Subjective  S: I go back to the md on thursday.  Special Tests: UEFI scored 57% Pain Assessment Currently in Pain?: Yes Pain Score:   5 Pain Location: Shoulder Pain Orientation: Right Pain Type: Acute pain  Precautions/Restrictions   progress as tolerated   Exercise/Treatments Supine Protraction: PROM;10 reps Horizontal ABduction: PROM;10 reps External Rotation: PROM;10 reps Internal Rotation: PROM;10 reps Flexion: PROM;10 reps ABduction: PROM;10 reps  Prone  Retraction: Strengthening;10 reps Retraction Weight (lbs): 3 Extension: Strengthening;10 reps Extension Weight (lbs): 3 Horizontal ABduction 1: Strengthening;10 reps Horizontal ABduction 1 Weight (lbs): 3 Horizontal ABduction 2: Strengthening;10 reps Horizontal ABduction 2 Weight (lbs): 3  Standing Protraction: Strengthening;10 reps Protraction Weight (lbs): 5 Horizontal ABduction: Strengthening;10 reps Horizontal ABduction Weight (lbs): 5 External Rotation: Strengthening;10 reps External Rotation Weight (lbs): 5 Internal Rotation: Strengthening;10 reps Internal Rotation Weight (lbs): 5 Flexion: Strengthening;10 reps Shoulder Flexion Weight (lbs): 5 ABduction: Strengthening;10 reps Shoulder ABduction Weight (lbs): 5 ROM / Strengthening / Isometric Strengthening UBE (Upper Arm Bike): 3' and 3' 5.5 Cybex Press: 3 plate;20 reps Cybex Row: 3 plate;20 reps "W" Arms: 10 reps with 5# using right arm only X to V Arms: 10  reps with 5# using right arm only   Body Blade Flexion: 3 reps ABduction: 3 reps External Rotation: 3 reps Internal Rotation: 3 reps    Manual Therapy Manual Therapy: Myofascial release Myofascial Release: MFR and manual stretching to right upper arm, shoulder region, scapular region, and anterior shoulder/AC joint to decrease pain and restrictions and increase pain free mobility in his [phone]   Occupational Therapy Assessment and Plan OT Assessment and Plan Clinical Impression Statement: A:  Please see MD progress note.  Patient's AROM in right arm is equal to his left arm.  Increased to 5# with strengthening exercises in standing.   OT Frequency: Min 2X/week OT Duration: 4 weeks OT Plan: P:  Continue per MD instruction.  Increase to 12 reps with 5# for increased challenge with strengthening exercises.  increase to 3 1/2 plates with cybext press and row.    Goals Short Term Goals Time to Complete Short Term Goals: 4 weeks Short Term Goal 1: Patient will be educated on HEP. Short Term Goal 1 Progress: Met Short Term Goal 2: Patient's R shoulder AROM will be assessed. Short Term Goal 2 Progress: Met Short Term Goal 3: Patient will increase PROM to WNL to increase ease with bathing and dressing. Short Term Goal 3 Progress: Met Short Term Goal 4: Patient will increase right shoulder strength to 3+/5 for increased ability to lift pots and pans to the stove. Short Term Goal 4 Progress: Met Short Term Goal 5: Patient will decrease pain to 4/10 when getting in and out of bed. Short Term Goal 5 Progress: Met Additional Short Term Goals?: Yes Short Term Goal 6: Patient will decrease fascial restrictions from maximal to moderate in R shoulder region. Long Term Goals Time to Complete Long Term Goals: 8 weeks Long Term Goal 1: Patient will return to PLOF with all B/IADL, work and leisure activities.  Long Term Goal 1 Progress: Progressing toward goal Long Term Goal 2: Patient will  increase AROM of R shoulder to WNL for increased ability to perform work related duties. Long Term Goal 2 Progress: Met Long Term Goal 3: Patient will increase right shoulder strength to 5/5 for increased independence with yardwork. Long Term Goal 3 Progress: Progressing toward goal Long Term Goal 4: Patient will decrease pain to 2/10 during functional activities. Long Term Goal 4 Progress: Progressing toward goal Long Term Goal 5: Patient will have min-trace fascial restrictions in R shoulder region. Long Term Goal 5 Progress: Met  Problem List Patient Active Problem List  Diagnosis  . Right rotator cuff tear  . Muscle weakness (generalized)  . Pain in joint, shoulder region    End of Session Activity Tolerance: Patient tolerated treatment well General Behavior During Session: [facility] for tasks performed Cognition: [facility] for tasks performed  GO    Shirlean Mylar, OTR/L  07/21/2012, 3:06 PM

## 2012-07-23 ENCOUNTER — Ambulatory Visit (HOSPITAL_COMMUNITY)
Admission: RE | Admit: 2012-07-23 | Discharge: 2012-07-23 | Disposition: A | Discharge status: Home / Self Care | Payer: BC Managed Care – PPO | Source: Ambulatory Visit | Attending: Orthopedic Surgery | Admitting: Orthopedic Surgery

## 2012-07-23 DIAGNOSIS — M6281 Muscle weakness (generalized): Secondary | ICD-10-CM

## 2012-07-23 DIAGNOSIS — M25519 Pain in unspecified shoulder: Secondary | ICD-10-CM

## 2012-07-23 NOTE — Progress Notes (Signed)
Occupational Therapy Treatment Patient Details  Name: Taylor Bean MRN: [id number] Date of Birth: 1940/10/26  Today's Date: 07/23/2012 Time: 7829-5621 OT Time Calculation (min): 51 min Manual Therapy 3086-5784 24' Therapeutic Exercise 1120-1146 26' Visit#: 42  of 48   Re-eval: 08/18/12 Assessment Diagnosis: s/p R RCR  Authorization: workers compensation   Authorization Time Period: Taylor Bean, OTR/Bean spoke with case manager on 06/26/12 who stated that [facility] would cover visits through 07/23/12   Authorization Visit#:   of    Subjective Symptoms/Limitations Symptoms: S:  It is the same nagging problem I have everyday Pain Assessment Currently in Pain?: Yes Pain Score:   5 Pain Location: Shoulder Pain Orientation: Right Pain Type: Other (Comment) (intermittent)  Precautions/Restrictions     Exercise/Treatments Supine Protraction: PROM;Strengthening;10 reps Protraction Weight (lbs): 5 Horizontal ABduction: PROM;Strengthening;10 reps Horizontal ABduction Weight (lbs): 5 External Rotation: PROM;Strengthening;10 reps External Rotation Weight (lbs): 5 Internal Rotation: PROM;Strengthening;10 reps Internal Rotation Weight (lbs): 5 Flexion: PROM;Strengthening;10 reps Shoulder Flexion Weight (lbs): 5 ABduction: PROM;Strengthening;10 reps Shoulder ABduction Weight (lbs): 5  Prone  Retraction: Strengthening;12 reps Retraction Weight (lbs): 3 Extension: Strengthening;12 reps Extension Weight (lbs): 3 Horizontal ABduction 1: Strengthening;12 reps Horizontal ABduction 1 Weight (lbs): 3 Horizontal ABduction 2: Strengthening;12 reps Horizontal ABduction 2 Weight (lbs): 3 Standing Protraction: Strengthening;12 reps Protraction Weight (lbs): 5 Horizontal ABduction: Strengthening;12 reps Horizontal ABduction Weight (lbs): 5 External Rotation: Strengthening;12 reps External Rotation Weight (lbs): 5 Internal Rotation: Strengthening;12 reps Internal Rotation Weight (lbs): 5 Flexion:  Strengthening;12 reps Shoulder Flexion Weight (lbs): 5 ABduction: Strengthening;12 reps Shoulder ABduction Weight (lbs): 5    Therapy Ball Right/Left: Other (comment) (time) ROM / Strengthening / Isometric Strengthening UBE (Upper Arm Bike): 3' and 3' 5.5 Cybex Press: 3 plate;20 reps Cybex Row: 3 plate;20 reps "W" Arms: 10 reps with 5# using right arm only X to V Arms: 10 reps with 5# using right arm only Proximal Shoulder Strengthening, Seated: x10 with 5# standing Ball on Wall: 1'flexion 1' abduction Graduated Retraction with Theraband: green x 7 Sustained Retraction with Theraband: green x 7 Other ROM/Strengthening Exercises: green x 15 Body Blade Flexion: Other (comment) (time) ABduction: Other (comment) (time) External Rotation: Other (comment) (time) Internal Rotation: Other (comment) (time)            Manual Therapy Manual Therapy: Myofascial release Myofascial Release: MFR and manual stretching to right upper arm, shoulder region, scapular region, and anterior shoulder/AC joint to decrease pain and restrictions and increase pain free mobility in his RUE   Occupational Therapy Assessment and Plan OT Assessment and Plan Clinical Impression Statement: A:  Increase reps with weighted exercises which fatigued patient also resumed all exercises. OT Plan: P: Increase to 3 1/2 plates with cybext press and row   Goals Short Term Goals Time to Complete Short Term Goals: 4 weeks Short Term Goal 1: Patient will be educated on HEP. Short Term Goal 2: Patient's R shoulder AROM will be assessed. Short Term Goal 3: Patient will increase PROM to WNL to increase ease with bathing and dressing. Short Term Goal 4: Patient will increase right shoulder strength to 3+/5 for increased ability to lift pots and pans to the stove. Short Term Goal 5: Patient will decrease pain to 4/10 when getting in and out of bed. Additional Short Term Goals?: Yes Short Term Goal 6: Patient will  decrease fascial restrictions from maximal to moderate in R shoulder region. Long Term Goals Time to Complete Long Term Goals: 8 weeks Long Term Goal 1:  Patient will return to PLOF with all B/IADL, work and leisure activities. Long Term Goal 2: Patient will increase AROM of R shoulder to WNL for increased ability to perform work related duties. Long Term Goal 3: Patient will increase right shoulder strength to 5/5 for increased independence with yardwork. Long Term Goal 4: Patient will decrease pain to 2/10 during functional activities. Long Term Goal 5: Patient will have min-trace fascial restrictions in R shoulder region.  Problem List Patient Active Problem List  Diagnosis  . Right rotator cuff tear  . Muscle weakness (generalized)  . Pain in joint, shoulder region    End of Session Activity Tolerance: Patient tolerated treatment well General Behavior During Session: [facility] for tasks performed Cognition: [facility] for tasks performed  GO    Taylor Bean, Taylor Bean 07/23/2012, 12:48 PM

## 2012-07-28 ENCOUNTER — Ambulatory Visit (HOSPITAL_COMMUNITY)
Admission: RE | Admit: 2012-07-28 | Discharge: 2012-07-28 | Disposition: A | Discharge status: Home / Self Care | Payer: BC Managed Care – PPO | Source: Ambulatory Visit | Attending: Orthopedic Surgery | Admitting: Orthopedic Surgery

## 2012-07-28 DIAGNOSIS — M6281 Muscle weakness (generalized): Secondary | ICD-10-CM

## 2012-07-28 DIAGNOSIS — M25519 Pain in unspecified shoulder: Secondary | ICD-10-CM

## 2012-07-28 NOTE — Progress Notes (Addendum)
Occupational Therapy Treatment Patient Details  Name: Taylor Bean MRN: [id number] Date of Birth: Dec 23, 1940  Today's Date: 07/28/2012 Time: 4098-1191 OT Time Calculation (min): 46 min Massage 1430-1440 10' Therex 4782-9562 16'  Visit#: 48  of 48   Re-eval: 08/18/12    Authorization: workers compensation  Authorization Time Period:    Authorization Visit#:   of    Subjective Symptoms/Limitations Symptoms: S: I went to the Dr last thursday and he said we might have to do another MRI to see why my shoulder isn't getting better. Pain Assessment Currently in Pain?: Yes Pain Score:   5 Pain Location: Shoulder Pain Orientation: Right Pain Type: Chronic pain Pain Frequency: Intermittent  Precautions/Restrictions   none  Exercise/Treatments Supine Protraction: PROM;Strengthening;10 reps Protraction Weight (lbs): 5 Horizontal ABduction: PROM;Strengthening;10 reps Horizontal ABduction Weight (lbs): 5 External Rotation: PROM;Strengthening;10 reps External Rotation Weight (lbs): 5 Internal Rotation: PROM;Strengthening;10 reps Internal Rotation Weight (lbs): 5 Flexion: PROM;Strengthening;10 reps Shoulder Flexion Weight (lbs): 5 ABduction: PROM;Strengthening;10 reps Shoulder ABduction Weight (lbs): 5 Prone  Retraction: Strengthening;12 reps Retraction Weight (lbs): 5 Extension: Strengthening;12 reps Extension Weight (lbs): 5 Horizontal ABduction 1: Strengthening;12 reps Horizontal ABduction 1 Weight (lbs): 5 Horizontal ABduction 2: Strengthening;12 reps Horizontal ABduction 2 Weight (lbs): 5 Standing Protraction: Strengthening;12 reps Protraction Weight (lbs): 5 Horizontal ABduction: Strengthening;12 reps Horizontal ABduction Weight (lbs): 5 External Rotation: Strengthening;12 reps External Rotation Weight (lbs): 5 Internal Rotation: Strengthening;12 reps Internal Rotation Weight (lbs): 5 Flexion: Strengthening;12 reps Shoulder Flexion Weight (lbs): 5 ABduction:  Strengthening;12 reps Shoulder ABduction Weight (lbs): 5 Therapy Ball Right/Left:  (left/right with red weighted ball) ROM / Strengthening / Isometric Strengthening UBE (Upper Arm Bike): 3' and 3' 5.5 "W" Arms: 10 reps with 5# using right arm only X to V Arms: 10 reps with 5# using right arm only Proximal Shoulder Strengthening, Seated: x10 with 5# standing      Manual Therapy Manual Therapy: Massage Massage: Massage and manual stretching to right upper arm, shoulder region, scapular region, and anterior shoulder/AC joint to decrease pain and restrictions and increase pain free mobility in his RUE  Occupational Therapy Assessment and Plan OT Assessment and Plan Clinical Impression Statement: A: Patient needed verbal cues for form. Patient stated that he saw the doctor on thursday and he may have to do another MRI. In Feb will transition to more of a HEP and only come 3-4x a month. OT Plan: P: Increase to 3 1/2 plates with cybext press and row *Re-assess next appt.*  Goals Short Term Goals Time to Complete Short Term Goals: 4 weeks Short Term Goal 1: Patient will be educated on HEP. Short Term Goal 2: Patient's R shoulder AROM will be assessed. Short Term Goal 3: Patient will increase PROM to WNL to increase ease with bathing and dressing. Short Term Goal 4: Patient will increase right shoulder strength to 3+/5 for increased ability to lift pots and pans to the stove. Short Term Goal 5: Patient will decrease pain to 4/10 when getting in and out of bed. Additional Short Term Goals?: Yes Short Term Goal 6: Patient will decrease fascial restrictions from maximal to moderate in R shoulder region. Long Term Goals Time to Complete Long Term Goals: 8 weeks Long Term Goal 1: Patient will return to PLOF with all B/IADL, work and leisure activities. Long Term Goal 1 Progress: Progressing toward goal Long Term Goal 2: Patient will increase AROM of R shoulder to WNL for increased ability to  perform work related duties. Long Term Goal  3: Patient will increase right shoulder strength to 5/5 for increased independence with yardwork. Long Term Goal 3 Progress: Progressing toward goal Long Term Goal 4: Patient will decrease pain to 2/10 during functional activities. Long Term Goal 4 Progress: Progressing toward goal Long Term Goal 5: Patient will have min-trace fascial restrictions in R shoulder region.  Problem List Patient Active Problem List  Diagnosis  . Right rotator cuff tear  . Muscle weakness (generalized)  . Pain in joint, shoulder region    End of Session Activity Tolerance: Patient tolerated treatment well General Behavior During Session: [facility] for tasks performed Cognition: [facility] for tasks performed   Limmie Patricia, OTR/L 07/28/2012, 3:45 PM

## 2012-07-30 ENCOUNTER — Inpatient Hospital Stay (HOSPITAL_COMMUNITY): Admission: RE | Admit: 2012-07-30 | Payer: Self-pay | Source: Ambulatory Visit | Admitting: Specialist

## 2012-08-04 ENCOUNTER — Ambulatory Visit (HOSPITAL_COMMUNITY)
Admission: RE | Admit: 2012-08-04 | Discharge: 2012-08-04 | Disposition: A | Discharge status: Home / Self Care | Payer: BC Managed Care – PPO | Source: Ambulatory Visit | Attending: Orthopedic Surgery | Admitting: Orthopedic Surgery

## 2012-08-04 DIAGNOSIS — M25519 Pain in unspecified shoulder: Secondary | ICD-10-CM

## 2012-08-04 DIAGNOSIS — M6281 Muscle weakness (generalized): Secondary | ICD-10-CM

## 2012-08-04 NOTE — Progress Notes (Signed)
Occupational Therapy Treatment Patient Details  Name: Taylor Bean MRN: [id number] Date of Birth: Jun 30, 1941  Today's Date: 08/04/2012 Time: 7253-6644 OT Time Calculation (min): 36 min Manual Therapy 1350-1405 15' Therapeutic Exercises [phone] 21' Visit#: 65  of 56   Re-eval: 08/18/12    Authorization: workers compensation    Subjective S:  They said they are posting my position at work. Pain Assessment Currently in Pain?: Yes Pain Score:   5 Pain Location: Shoulder Pain Orientation: Right Pain Type: Acute pain  Precautions/Restrictions   progress as tolerated  Exercise/Treatments  08/04/12 2100  Shoulder Exercises: Supine  Protraction PROM;10 reps  Horizontal ABduction PROM;10 reps  External Rotation PROM;10 reps  Internal Rotation PROM;10 reps  Flexion PROM;10 reps  ABduction PROM;10 reps    08/04/12 1400  Shoulder Exercises: Sidelying  External Rotation Theraband;15 reps  Theraband Level (Shoulder External Rotation) Level 3 (Green)  Internal Rotation Theraband;15 reps  Theraband Level (Shoulder Internal Rotation) Level 3 (Green) (to anterior and posterior waist band )  Shoulder Exercises: Standing  ABduction Theraband;15 reps  Theraband Level (Shoulder ABduction) Level 3 (Green)  Shoulder Exercises: Therapy Ball  Right/Left (left and right at waist level with red weighted ball)  Shoulder Exercises: ROM/Strengthening  UBE (Upper Arm Bike) unavailable  Cybex Press 3 plate;25 reps  Cybex Row 3 plate;25 reps  "W" Arms resume next visit  X to V Arms resume next visit  Proximal Shoulder Strengthening, Seated resume next visit  Ball on Wall 1' abduction with 2#         Manual Therapy Manual Therapy: Myofascial release Myofascial Release: MFR and manual stretching to right upper arm, shoulder region, scapular region, and anterior shoulder/AC joint to decrease pain and restrictions and increase pain free mobility in his RUE   Occupational Therapy Assessment  and Plan OT Assessment and Plan Clinical Impression Statement: A: Added internal rotation exercises that are functional in nature to improve ability to reach behind his back without pain or difficulty OT Plan: P:  Increase to 3.5 plates on cybex press and row.  Focus on activities that strengthen and improve range with abduction and internal rotation.   Goals Short Term Goals Time to Complete Short Term Goals: 4 weeks Short Term Goal 1: Patient will be educated on HEP. Short Term Goal 2: Patient's R shoulder AROM will be assessed. Short Term Goal 3: Patient will increase PROM to WNL to increase ease with bathing and dressing. Short Term Goal 4: Patient will increase right shoulder strength to 3+/5 for increased ability to lift pots and pans to the stove. Short Term Goal 5: Patient will decrease pain to 4/10 when getting in and out of bed. Additional Short Term Goals?: Yes Short Term Goal 6: Patient will decrease fascial restrictions from maximal to moderate in R shoulder region. Long Term Goals Time to Complete Long Term Goals: 8 weeks Long Term Goal 1: Patient will return to PLOF with all B/IADL, work and leisure activities. Long Term Goal 2: Patient will increase AROM of R shoulder to WNL for increased ability to perform work related duties. Long Term Goal 3: Patient will increase right shoulder strength to 5/5 for increased independence with yardwork. Long Term Goal 4: Patient will decrease pain to 2/10 during functional activities. Long Term Goal 5: Patient will have min-trace fascial restrictions in R shoulder region.  Problem List Patient Active Problem List  Diagnosis  . Right rotator cuff tear  . Muscle weakness (generalized)  . Pain in joint, shoulder  region    End of Session Activity Tolerance: Patient tolerated treatment well General Behavior During Session: [facility] for tasks performed Cognition: [facility] for tasks performed  GO    Shirlean Mylar, OTR/L  08/04/2012, 9:58  PM

## 2012-08-06 ENCOUNTER — Ambulatory Visit (HOSPITAL_COMMUNITY)
Admission: RE | Admit: 2012-08-06 | Discharge: 2012-08-06 | Disposition: A | Discharge status: Home / Self Care | Payer: BC Managed Care – PPO | Source: Ambulatory Visit | Attending: Orthopedic Surgery | Admitting: Orthopedic Surgery

## 2012-08-06 DIAGNOSIS — M25519 Pain in unspecified shoulder: Secondary | ICD-10-CM

## 2012-08-06 DIAGNOSIS — M6281 Muscle weakness (generalized): Secondary | ICD-10-CM

## 2012-08-06 NOTE — Progress Notes (Signed)
Occupational Therapy Treatment Patient Details  Name: Taylor Bean MRN: [id number] Date of Birth: September 26, 1940  Today's Date: 08/06/2012 Time: 4098-1191 OT Time Calculation (min): 30 min Manual Therapy 4782-9562 10' Therapuetic Exercises 1308-6578 20' Visit#: 50  of 56   Re-eval: 08/18/12    Authorization: workers compensation    Subjective S:  I need to be out of here by 145.  I really liked the exercises I did laying on my side last time, they really worked my arm.  Pain Assessment Currently in Pain?: Yes Pain Score:   5 Pain Location: Shoulder Pain Orientation: Right Pain Type: Acute pain  Precautions/Restrictions   n/a  Exercise/Treatments Supine Protraction: PROM;10 reps Horizontal ABduction: PROM;10 reps External Rotation: PROM;10 reps Internal Rotation: PROM;10 reps Flexion: PROM;10 reps ABduction: PROM;10 reps  Sidelying External Rotation: Theraband;15 reps Theraband Level (Shoulder External Rotation): Level 3 (Green) Internal Rotation: Theraband;15 reps Theraband Level (Shoulder Internal Rotation): Level 3 (Green) (to anterior and posterior waistband) Standing Horizontal ABduction: Theraband;15 reps Theraband Level (Shoulder Horizontal ABduction): Level 4 (Blue) ABduction: Theraband;15 reps Theraband Level (Shoulder ABduction): Level 4 (Blue) ROM / Strengthening / Isometric Strengthening UBE (Upper Arm Bike): 3' and 3' 5.5 Cybex Press: 3 plate;25 reps Cybex Row: 3 plate;25 reps Over Head Lace: 2' with 3# on wrist      Manual Therapy Manual Therapy: Myofascial release Myofascial Release: MFR and manual stretching to right upper arm, shoulder region, scapular region, and anterior shoulder/AC joint to decrease pain and restrictions and increase pain free mobility in his RUE [phone]  Occupational Therapy Assessment and Plan OT Assessment and Plan Clinical Impression Statement: A:  Increased to max resist blue tband for abduction strengthening and also  added horizontal abduction in standing with theraband for increased strengthening and sustained activity tolerance for activities involving reaching out to his side. OT Plan: P:  Increase to 3.5 plates on cybex press and row.  Increase time on overhead lace activity as his sustained activity tolerance improves.   Goals Short Term Goals Time to Complete Short Term Goals: 4 weeks Short Term Goal 1: Patient will be educated on HEP. Short Term Goal 2: Patient's R shoulder AROM will be assessed. Short Term Goal 3: Patient will increase PROM to WNL to increase ease with bathing and dressing. Short Term Goal 4: Patient will increase right shoulder strength to 3+/5 for increased ability to lift pots and pans to the stove. Short Term Goal 5: Patient will decrease pain to 4/10 when getting in and out of bed. Additional Short Term Goals?: Yes Short Term Goal 6: Patient will decrease fascial restrictions from maximal to moderate in R shoulder region. Long Term Goals Time to Complete Long Term Goals: 8 weeks Long Term Goal 1: Patient will return to PLOF with all B/IADL, work and leisure activities. Long Term Goal 1 Progress: Progressing toward goal Long Term Goal 2: Patient will increase AROM of R shoulder to WNL for increased ability to perform work related duties. Long Term Goal 3: Patient will increase right shoulder strength to 5/5 for increased independence with yardwork. Long Term Goal 3 Progress: Progressing toward goal Long Term Goal 4: Patient will decrease pain to 2/10 during functional activities. Long Term Goal 4 Progress: Progressing toward goal Long Term Goal 5: Patient will have min-trace fascial restrictions in R shoulder region. Long Term Goal 5 Progress: Progressing toward goal  Problem List Patient Active Problem List  Diagnosis  . Right rotator cuff tear  . Muscle weakness (generalized)  .  Pain in joint, shoulder region    End of Session Activity Tolerance: Patient tolerated  treatment well General Behavior During Session: [facility] for tasks performed Cognition: [facility] for tasks performed  Shirlean Mylar, OTR/L  08/06/2012, 4:21 PM

## 2012-08-11 ENCOUNTER — Ambulatory Visit (HOSPITAL_COMMUNITY): Payer: Self-pay

## 2012-08-13 ENCOUNTER — Ambulatory Visit (HOSPITAL_COMMUNITY)
Admission: RE | Admit: 2012-08-13 | Discharge: 2012-08-13 | Disposition: A | Discharge status: Home / Self Care | Payer: BC Managed Care – PPO | Source: Ambulatory Visit | Attending: Orthopedic Surgery | Admitting: Orthopedic Surgery

## 2012-08-13 DIAGNOSIS — M6281 Muscle weakness (generalized): Secondary | ICD-10-CM

## 2012-08-13 DIAGNOSIS — M25519 Pain in unspecified shoulder: Secondary | ICD-10-CM

## 2012-08-13 NOTE — Progress Notes (Signed)
Occupational Therapy Treatment Patient Details  Name: Taylor Bean MRN: [id number] Date of Birth: September 26, 1940  Today's Date: 08/13/2012 Time: 6213-0865 OT Time Calculation (min): 35 min Manual Therapy 7846-9629 11' Reassessment 1321-1330 9' ADL/HEP instruction 5284-1324 10' Visit#: 35  of 56   Re-eval: 09/10/12    Authorization: workers compensation   Subjective S:  This is my last appointment before I begin coming once a week rather than twice a week.  Special Tests: UEFI score was 57% I level and is now 61% I level. Pain Assessment Currently in Pain?: Yes Pain Score:   4 Pain Location: Shoulder Pain Orientation: Right Pain Type: Acute pain  Precautions/Restrictions   progress as tolerated  Exercise/Treatments ROM / Strengthening / Isometric Strengthening UBE (Upper Arm Bike): 3' and 3' 5.5      Manual Therapy Manual Therapy: Myofascial release Myofascial Release: MFR and manual stretching to right upper arm, shoulder region, scapular region, and anterior shoulder/AC joint to decrease pain and restrictions and increase pain free mobility in his RUE   Occupational Therapy Assessment and Plan OT Assessment and Plan Clinical Impression Statement: A:  Please refer to MD progress note.  Decreasing to one time per week, per MD order.  Reviewed general strengthening program for home using theraband and 3# for entire upper body strengthening OT Plan: P:  Once per week visits, per MD order to manage/upgrade HEP.   Goals Short Term Goals Time to Complete Short Term Goals: 4 weeks Short Term Goal 1: Patient will be educated on HEP. Short Term Goal 1 Progress: Met Short Term Goal 2: Patient's R shoulder AROM will be assessed. Short Term Goal 2 Progress: Met Short Term Goal 3: Patient will increase PROM to WNL to increase ease with bathing and dressing. Short Term Goal 3 Progress: Met Short Term Goal 4: Patient will increase right shoulder strength to 3+/5 for increased ability  to lift pots and pans to the stove. Short Term Goal 4 Progress: Met Short Term Goal 5: Patient will decrease pain to 4/10 when getting in and out of bed. Short Term Goal 5 Progress: Met Additional Short Term Goals?: Yes Short Term Goal 6: Patient will decrease fascial restrictions from maximal to moderate in R shoulder region. Short Term Goal 6 Progress: Met Long Term Goals Time to Complete Long Term Goals: 8 weeks Long Term Goal 1: Patient will return to PLOF with all B/IADL, work and leisure activities. Long Term Goal 1 Progress: Progressing toward goal Long Term Goal 2: Patient will increase AROM of R shoulder to WNL for increased ability to perform work related duties. Long Term Goal 2 Progress: Met Long Term Goal 3: Patient will increase right shoulder strength to 5/5 for increased independence with yardwork. Long Term Goal 3 Progress: Met Long Term Goal 4: Patient will decrease pain to 2/10 during functional activities. Long Term Goal 4 Progress: Progressing toward goal Long Term Goal 5: Patient will have min-trace fascial restrictions in R shoulder region. Long Term Goal 5 Progress: Met  Problem List Patient Active Problem List  Diagnosis  . Right rotator cuff tear  . Muscle weakness (generalized)  . Pain in joint, shoulder region    End of Session Activity Tolerance: Patient tolerated treatment well General Behavior During Session: [facility] for tasks performed Cognition: [facility] for tasks performed OT Plan of Care OT Home Exercise Plan: Educated patient on tband for shoulder strengthening, all ranges.  Upper body strengthening with 3# strengthening.  GO    Shirlean Mylar,  OTR/L  08/13/2012, 2:58 PM

## 2012-09-02 ENCOUNTER — Ambulatory Visit (HOSPITAL_COMMUNITY)
Admission: RE | Admit: 2012-09-02 | Discharge: 2012-09-02 | Disposition: A | Discharge status: Home / Self Care | Payer: BC Managed Care – PPO | Source: Ambulatory Visit | Attending: Family Medicine | Admitting: Family Medicine

## 2012-09-02 DIAGNOSIS — IMO0001 Reserved for inherently not codable concepts without codable children: Secondary | ICD-10-CM | POA: Insufficient documentation

## 2012-09-02 DIAGNOSIS — M25519 Pain in unspecified shoulder: Secondary | ICD-10-CM | POA: Insufficient documentation

## 2012-09-02 DIAGNOSIS — M6281 Muscle weakness (generalized): Secondary | ICD-10-CM | POA: Insufficient documentation

## 2012-09-02 NOTE — Progress Notes (Signed)
Occupational Therapy Treatment Patient Details  Name: Taylor Bean MRN: [id number] Date of Birth: 23-Jan-1941  Today's Date: 09/02/2012 Time: 5784-6962 OT Time Calculation (min): 39 min MFR [phone] 13' Therex [phone] 26'  Visit#: 52 of 56  Re-eval: 09/10/12    Authorization: workers compensation  Authorization Time Period:    Authorization Visit#:   of    Subjective Symptoms/Limitations Symptoms: S: I see the doctor again tomorrow. My shoulder clicks when I do my exercises. Pain Assessment Currently in Pain?: No/denies  Precautions/Restrictions  Precautions Precautions: None  Exercise/Treatments Supine Protraction: Strengthening;12 reps;Weights Protraction Weight (lbs): 5 Horizontal ABduction: Strengthening;12 reps;Weights Horizontal ABduction Weight (lbs): 5 External Rotation: Strengthening;12 reps;Weights External Rotation Weight (lbs): 5 Internal Rotation: Strengthening;12 reps;Weights Internal Rotation Weight (lbs): 5 Flexion: Strengthening;12 reps;Weights Shoulder Flexion Weight (lbs): 5 ABduction: Strengthening;12 reps;Weights Shoulder ABduction Weight (lbs): 5 Standing Horizontal ABduction: Theraband;15 reps Theraband Level (Shoulder Horizontal ABduction): Level 4 (Blue) External Rotation: Theraband;15 reps Theraband Level (Shoulder External Rotation): Level 4 (Blue) Internal Rotation: Theraband;15 reps Theraband Level (Shoulder Internal Rotation): Level 4 (Blue) Row: Theraband;15 reps Theraband Level (Shoulder Row): Level 4 (Blue) Retraction: Theraband;15 reps Theraband Level (Shoulder Retraction): Level 4 (Blue) Therapy Ball Right/Left:  (with red weighted ball; x5 left/right) ROM / Strengthening / Isometric Strengthening UBE (Upper Arm Bike): 3' and 3' 5.5      Manual Therapy Manual Therapy: Myofascial release Myofascial Release: MFR and manual stretching to right upper arm, shoulder region, scapular region, and anterior shoulder/AC joint to  decrease pain and restrictions and increase pain free mobility in his RUE   Occupational Therapy Assessment and Plan OT Assessment and Plan Clinical Impression Statement: A: Pt performing HEP at home. Performed MFR to right shoulder with min tightness noted. Reviewed HEP exercises and suggested various exercises that he could had to increase IR with arm abducted.  OT Plan: P:  Once per week visits, per MD order to manage/upgrade HEP.   Goals Short Term Goals Time to Complete Short Term Goals: 4 weeks Short Term Goal 1: Patient will be educated on HEP. Short Term Goal 2: Patient's R shoulder AROM will be assessed. Short Term Goal 3: Patient will increase PROM to WNL to increase ease with bathing and dressing. Short Term Goal 4: Patient will increase right shoulder strength to 3+/5 for increased ability to lift pots and pans to the stove. Short Term Goal 5: Patient will decrease pain to 4/10 when getting in and out of bed. Additional Short Term Goals?: Yes Short Term Goal 6: Patient will decrease fascial restrictions from maximal to moderate in R shoulder region. Long Term Goals Time to Complete Long Term Goals: 8 weeks Long Term Goal 1: Patient will return to PLOF with all B/IADL, work and leisure activities. Long Term Goal 1 Progress: Progressing toward goal Long Term Goal 2: Patient will increase AROM of R shoulder to WNL for increased ability to perform work related duties. Long Term Goal 3: Patient will increase right shoulder strength to 5/5 for increased independence with yardwork. Long Term Goal 4: Patient will decrease pain to 2/10 during functional activities. Long Term Goal 4 Progress: Progressing toward goal Long Term Goal 5: Patient will have min-trace fascial restrictions in R shoulder region.  Problem List Patient Active Problem List  Diagnosis  . Right rotator cuff tear  . Muscle weakness (generalized)  . Pain in joint, shoulder region    End of Session Activity  Tolerance: Patient tolerated treatment well General Behavior During Session: [facility] for tasks  performed Cognition: [facility] [place] for tasks performed   Limmie Patricia, OTR/L  09/02/2012, 12:04 PM

## 2012-09-10 ENCOUNTER — Ambulatory Visit (HOSPITAL_COMMUNITY)
Admission: RE | Admit: 2012-09-10 | Discharge: 2012-09-10 | Disposition: A | Discharge status: Home / Self Care | Payer: BC Managed Care – PPO | Source: Ambulatory Visit | Attending: Orthopedic Surgery | Admitting: Orthopedic Surgery

## 2012-09-10 DIAGNOSIS — M25511 Pain in right shoulder: Secondary | ICD-10-CM

## 2012-09-10 DIAGNOSIS — M6281 Muscle weakness (generalized): Secondary | ICD-10-CM

## 2012-09-10 NOTE — Progress Notes (Signed)
Occupational Therapy Treatment Patient Details  Name: Taylor Bean MRN: [id number] Date of Birth: 07/29/1940  Today's Date: 09/10/2012 Time: 1017-1100 OT Time Calculation (min): 43 min MFR 4098-1191 18' Re-assess 1035-1040 5' Therex 1040-1100 20'  Visit#: 60 of 56  Re-eval: 10/08/12    Authorization: workers Engineer, civil (consulting) Time Period:    Authorization Visit#:   of    Subjective Symptoms/Limitations Symptoms: S: I saw the doctor and he was very pleased with my progress. Special Tests: UEFI score was 61% I level and is not 99% I level. Pain Assessment Currently in Pain?: No/denies  Precautions/Restrictions  Precautions Precautions: None  Exercise/Treatments Standing Other Standing Exercises: Mimic overhead throw with blue theraband x15 reps Other Standing Exercises: Ball throw with trampoline; red ball; x20 ROM / Strengthening / Isometric Strengthening UBE (Upper Arm Bike): 3' and 3' 5.5      Manual Therapy Manual Therapy: Myofascial release Myofascial Release: MFR and manual stretching to right upper arm, shoulder region, scapular region, and anterior shoulder/AC joint to decrease pain and restrictions and increase pain free mobility in his RUE   Occupational Therapy Assessment and Plan OT Assessment and Plan Clinical Impression Statement: A: See MD note for progress. Added ball throwing theraband exercise and ball throwing on trampoline. OT Plan: P:  Once per week visits, per MD order to manage/upgrade HEP.   Goals Short Term Goals Time to Complete Short Term Goals: 4 weeks Short Term Goal 1: Patient will be educated on HEP. Short Term Goal 2: Patient's R shoulder AROM will be assessed. Short Term Goal 3: Patient will increase PROM to WNL to increase ease with bathing and dressing. Short Term Goal 4: Patient will increase right shoulder strength to 3+/5 for increased ability to lift pots and pans to the stove. Short Term Goal 5: Patient will decrease  pain to 4/10 when getting in and out of bed. Additional Short Term Goals?: Yes Short Term Goal 6: Patient will decrease fascial restrictions from maximal to moderate in R shoulder region. Long Term Goals Time to Complete Long Term Goals: 8 weeks Long Term Goal 1: Patient will return to PLOF with all B/IADL, work and leisure activities. Long Term Goal 1 Progress: Progressing toward goal Long Term Goal 2: Patient will increase AROM of R shoulder to WNL for increased ability to perform work related duties. Long Term Goal 3: Patient will increase right shoulder strength to 5/5 for increased independence with yardwork. Long Term Goal 4: Patient will decrease pain to 2/10 during functional activities. Long Term Goal 4 Progress: Progressing toward goal Long Term Goal 5: Patient will have min-trace fascial restrictions in R shoulder region.  Problem List Patient Active Problem List  Diagnosis  . Right rotator cuff tear  . Muscle weakness (generalized)  . Pain in joint, shoulder region    End of Session Activity Tolerance: Patient tolerated treatment well General Behavior During Session: [facility] for tasks performed Cognition: [facility] for tasks performed   Limmie Patricia, OTR/L  09/10/2012, 12:09 PM

## 2012-09-17 ENCOUNTER — Ambulatory Visit (HOSPITAL_COMMUNITY)
Admission: RE | Admit: 2012-09-17 | Discharge: 2012-09-17 | Disposition: A | Discharge status: Home / Self Care | Payer: BC Managed Care – PPO | Source: Ambulatory Visit | Attending: Family Medicine | Admitting: Family Medicine

## 2012-09-17 DIAGNOSIS — M25519 Pain in unspecified shoulder: Secondary | ICD-10-CM | POA: Insufficient documentation

## 2012-09-17 DIAGNOSIS — M6281 Muscle weakness (generalized): Secondary | ICD-10-CM | POA: Insufficient documentation

## 2012-09-17 DIAGNOSIS — IMO0001 Reserved for inherently not codable concepts without codable children: Secondary | ICD-10-CM | POA: Insufficient documentation

## 2012-09-17 DIAGNOSIS — M25511 Pain in right shoulder: Secondary | ICD-10-CM

## 2012-09-17 NOTE — Progress Notes (Signed)
Occupational Therapy Treatment Patient Details  Name: Taylor Bean MRN: [id number] Date of Birth: 09-Feb-1941  Today's Date: 09/17/2012 Time: 7829-5621 OT Time Calculation (min): 41 min Manual Therapy 308-657 14' Therapeutic Exercise [phone] 27' Visit#: 54 of 56  Re-eval: 10/08/12    Authorization: workers compensation  Authorization Time Period: 4 additional visits, with 2 of these visits remaining.    Authorization Visit#:   of    Subjective S:  I m trying to get this movement out to the side and overhead.   Pain Assessment Currently in Pain?: No/denies  Precautions/Restrictions     Exercise/Treatments Standing Horizontal ABduction: Theraband;15 reps Theraband Level (Shoulder Horizontal ABduction): Level 4 (Blue) External Rotation: Theraband;15 reps Theraband Level (Shoulder External Rotation): Level 4 (Blue) Internal Rotation: Theraband;15 reps Theraband Level (Shoulder Internal Rotation): Level 4 (Blue) ROM / Strengthening / Isometric Strengthening Rebounder: green weighted ball overhead throw x 20 reps UBE (Upper Arm Bike): 3' and 3' 6.0 Over Head Lace: 2' with 3# Ball on Wall: 1' flexion and 1' abduction with 3# weight on wrist Other ROM/Strengthening Exercises: box lift with a total of 10 # x 15 reps lifting from waist height to overhead   Body Blade Flexion: 30 seconds;3 reps ABduction: 30 seconds;3 reps External Rotation: 30 seconds;3 reps Internal Rotation: 30 seconds;3 reps    Manual Therapy Manual Therapy: Myofascial release Myofascial Release: MFR and manual stretching to right upper arm, shoulder region, scapular region, and anterior shoulder/AC joint to decrease pain and restrictions and increase pain free mobility in his RUE   Occupational Therapy Assessment and Plan OT Assessment and Plan Clinical Impression Statement: A:  Added box lifting for work simulation, and patient completed without difficulty. OT Plan: P:  2 additional visits per wc, then  dc to a HEP.  Increase weight and reps with box lift and add floor to waist.   Goals Short Term Goals Time to Complete Short Term Goals: 4 weeks Short Term Goal 1: Patient will be educated on HEP. Short Term Goal 2: Patient's R shoulder AROM will be assessed. Short Term Goal 3: Patient will increase PROM to WNL to increase ease with bathing and dressing. Short Term Goal 4: Patient will increase right shoulder strength to 3+/5 for increased ability to lift pots and pans to the stove. Short Term Goal 5: Patient will decrease pain to 4/10 when getting in and out of bed. Additional Short Term Goals?: Yes Short Term Goal 6: Patient will decrease fascial restrictions from maximal to moderate in R shoulder region. Long Term Goals Time to Complete Long Term Goals: 8 weeks Long Term Goal 1: Patient will return to PLOF with all B/IADL, work and leisure activities. Long Term Goal 1 Progress: Progressing toward goal Long Term Goal 2: Patient will increase AROM of R shoulder to WNL for increased ability to perform work related duties. Long Term Goal 3: Patient will increase right shoulder strength to 5/5 for increased independence with yardwork. Long Term Goal 4: Patient will decrease pain to 2/10 during functional activities. Long Term Goal 4 Progress: Progressing toward goal Long Term Goal 5: Patient will have min-trace fascial restrictions in R shoulder region.  Problem List Patient Active Problem List  Diagnosis  . Right rotator cuff tear  . Muscle weakness (generalized)  . Pain in joint, shoulder region    End of Session Activity Tolerance: Patient tolerated treatment well General Behavior During Session: [facility] for tasks performed Cognition: [facility] for tasks performed  GO    [facility]  Esmond Harps, OTR/L  09/17/2012, 11:59 AM

## 2012-09-24 ENCOUNTER — Ambulatory Visit (HOSPITAL_COMMUNITY)
Admission: RE | Admit: 2012-09-24 | Discharge: 2012-09-24 | Disposition: A | Discharge status: Home / Self Care | Payer: BC Managed Care – PPO | Source: Ambulatory Visit | Attending: Orthopedic Surgery | Admitting: Orthopedic Surgery

## 2012-09-24 DIAGNOSIS — M25511 Pain in right shoulder: Secondary | ICD-10-CM

## 2012-09-24 DIAGNOSIS — M6281 Muscle weakness (generalized): Secondary | ICD-10-CM

## 2012-09-24 NOTE — Progress Notes (Signed)
Occupational Therapy Treatment Patient Details  Name: Taylor Bean MRN: [id number] Date of Birth: Jul 28, 1940  Today's Date: 09/24/2012 Time: 7829-5621 OT Time Calculation (min): 37 min MFR 3086-5784 15' Therex 6962-9528 22'  Visit#: 55 of 56  Re-eval: 10/08/12    Authorization: workers compensation  Authorization Time Period: 4 additional visits, with 1 of these visits remaining.    Authorization Visit#:   of    Subjective Symptoms/Limitations Symptoms: S: I don't have any pain. Sometimes some tightness. Pain Assessment Currently in Pain?: No/denies  Precautions/Restrictions  Precautions Precautions: None  Exercise/Treatments Standing Horizontal ABduction: Theraband;15 reps Theraband Level (Shoulder Horizontal ABduction): Level 4 (Blue) External Rotation: Theraband;15 reps Theraband Level (Shoulder External Rotation): Level 4 (Blue) Internal Rotation: Theraband;15 reps Theraband Level (Shoulder Internal Rotation): Level 4 (Blue) ABduction: Theraband;15 reps Theraband Level (Shoulder ABduction): Level 4 (Blue) ROM / Strengthening / Isometric Strengthening UBE (Upper Arm Bike): 3' and 3' 6.0 Other ROM/Strengthening Exercises: box lift with total of 13# X15 lifting waist to overhead. Also completed floor to waist x7. Difficulty with floor to waist due to bad knees.      Manual Therapy Manual Therapy: Myofascial release Myofascial Release: MFR and manual stretching to right upper arm, shoulder region, scapular region, and anterior shoulder/AC joint to decrease pain and restrictions and increase pain free mobility in his RUE   Occupational Therapy Assessment and Plan OT Assessment and Plan Clinical Impression Statement: A: Added weight to box lifting activity. Difficulty with floor to waist due to bad knees. OT Plan: P: Reassess and D/C next tx session. Review HEP and add to it if needed.   Goals Short Term Goals Time to Complete Short Term Goals: 4 weeks Short Term  Goal 1: Patient will be educated on HEP. Short Term Goal 2: Patient's R shoulder AROM will be assessed. Short Term Goal 3: Patient will increase PROM to WNL to increase ease with bathing and dressing. Short Term Goal 4: Patient will increase right shoulder strength to 3+/5 for increased ability to lift pots and pans to the stove. Short Term Goal 5: Patient will decrease pain to 4/10 when getting in and out of bed. Additional Short Term Goals?: Yes Short Term Goal 6: Patient will decrease fascial restrictions from maximal to moderate in R shoulder region. Long Term Goals Time to Complete Long Term Goals: 8 weeks Long Term Goal 1: Patient will return to PLOF with all B/IADL, work and leisure activities. Long Term Goal 1 Progress: Progressing toward goal Long Term Goal 2: Patient will increase AROM of R shoulder to WNL for increased ability to perform work related duties. Long Term Goal 3: Patient will increase right shoulder strength to 5/5 for increased independence with yardwork. Long Term Goal 4: Patient will decrease pain to 2/10 during functional activities. Long Term Goal 4 Progress: Progressing toward goal Long Term Goal 5: Patient will have min-trace fascial restrictions in R shoulder region.  Problem List Patient Active Problem List  Diagnosis  . Right rotator cuff tear  . Muscle weakness (generalized)  . Pain in joint, shoulder region    End of Session Activity Tolerance: Patient tolerated treatment well General Behavior During Session: [facility] for tasks performed Cognition: [facility] for tasks performed   Limmie Patricia, OTR/L  09/24/2012, 3:16 PM

## 2012-10-01 ENCOUNTER — Ambulatory Visit (HOSPITAL_COMMUNITY)
Admission: RE | Admit: 2012-10-01 | Discharge: 2012-10-01 | Disposition: A | Discharge status: Home / Self Care | Payer: BC Managed Care – PPO | Source: Ambulatory Visit | Attending: Orthopedic Surgery | Admitting: Orthopedic Surgery

## 2012-10-01 DIAGNOSIS — M25511 Pain in right shoulder: Secondary | ICD-10-CM

## 2012-10-01 DIAGNOSIS — M6281 Muscle weakness (generalized): Secondary | ICD-10-CM

## 2012-10-01 NOTE — Progress Notes (Signed)
Occupational Therapy Treatment Patient Details  Name: Taylor Bean MRN: [id number] Date of Birth: 1941/01/22  Today's Date: 10/01/2012 Time: 5284-1324 OT Time Calculation (min): 25 min Manual therapy/reassess 25'  Visit#: 56 of 56  Re-eval: 10/08/12    Authorization: workers compensation  Authorization Time Period:    Authorization Visit#:   of    Subjective Symptoms/Limitations Symptoms: S:  This is my last session. Pain Assessment Pain Score: 0-No pain Pain Location: Shoulder Pain Orientation: Right Pain Type: Acute pain  Precautions/Restrictions  Precautions Precautions: None      Manual Therapy Manual Therapy: Myofascial release Myofascial Release: MFR and manual stretching to right scapular, trapezius, anterior shoulder and upper arm regions to decrease pain and restrictions and increase pain free mobility.  Occupational Therapy Assessment and Plan OT Assessment and Plan Clinical Impression Statement: A:  See D/C note OT Plan: P:  D/C to HEP   Goals Short Term Goals Time to Complete Short Term Goals: 4 weeks Short Term Goal 1: Patient will be educated on HEP. Short Term Goal 2: Patient's R shoulder AROM will be assessed. Short Term Goal 3: Patient will increase PROM to WNL to increase ease with bathing and dressing. Short Term Goal 4: Patient will increase right shoulder strength to 3+/5 for increased ability to lift pots and pans to the stove. Short Term Goal 5: Patient will decrease pain to 4/10 when getting in and out of bed. Additional Short Term Goals?: Yes Short Term Goal 6: Patient will decrease fascial restrictions from maximal to moderate in R shoulder region. Long Term Goals Time to Complete Long Term Goals: 8 weeks Long Term Goal 1: Patient will return to PLOF with all B/IADL, work and leisure activities. Long Term Goal 2: Patient will increase AROM of R shoulder to WNL for increased ability to perform work related duties. Long Term Goal 3:  Patient will increase right shoulder strength to 5/5 for increased independence with yardwork. Long Term Goal 4: Patient will decrease pain to 2/10 during functional activities. Long Term Goal 4 Progress: Met Long Term Goal 5: Patient will have min-trace fascial restrictions in R shoulder region. Long Term Goal 5 Progress: Met  Problem List Patient Active Problem List  Diagnosis  . Right rotator cuff tear  . Muscle weakness (generalized)  . Pain in joint, shoulder region    End of Session Activity Tolerance: Patient tolerated treatment well General Behavior During Session: [facility] for tasks performed Cognition: [facility] for tasks performed  GO    Tonnya Garbett L. Lizza Huffaker, COTA/L 10/01/2012, 11:59 AM

## 2013-01-06 ENCOUNTER — Encounter (HOSPITAL_COMMUNITY): Payer: Self-pay | Admitting: Pharmacy Technician

## 2013-01-11 NOTE — Pre-Procedure Instructions (Signed)
[facility]  01/11/2013   Your procedure is scheduled on: Tuesday, July 8th.  Report to [facility] at 5:30AM.             Enter through the Main Entrance, take [place] Elevators to 3rd Floor- Short Stay  Call this number if you have problems the morning of surgery: [phone]   Remember:   Do not eat food or drink liquids after midnight.   Take these medicines the morning of surgery with A SIP OF WATER: escitalopram (LEXAPRO)    Do not wear jewelry, make-up or nail polish.  Do not wear lotions, powders, or perfumes. You may wear deodorant.   Men may shave face and neck.  Do not bring valuables to the hospital.  [facility] is not responsible for any belongings or valuables.  Contacts, dentures or bridgework may not be worn into surgery.  Leave suitcase in the car. After surgery it may be brought to your room.  For patients admitted to the hospital, checkout time is 11:00 AM the day of discharge.   Special Instructions: Shower using CHG 2 nights before surgery and the night before surgery.  If you shower the day of surgery use CHG.  Use special wash - you have one bottle of CHG for all showers.  You should use approximately 1/3 of the bottle for each shower.   Please read over the following fact sheets that you were given: Pain Booklet, Coughing and Deep Breathing, Blood Transfusion Information and Surgical Site Infection Prevention

## 2013-01-12 ENCOUNTER — Encounter (HOSPITAL_COMMUNITY)
Admission: RE | Admit: 2013-01-12 | Discharge: 2013-01-12 | Disposition: A | Discharge status: Home / Self Care | Payer: Medicare Other | Source: Ambulatory Visit | Attending: Orthopedic Surgery | Admitting: Orthopedic Surgery

## 2013-01-12 ENCOUNTER — Encounter (HOSPITAL_COMMUNITY): Payer: Self-pay

## 2013-01-12 DIAGNOSIS — Z01812 Encounter for preprocedural laboratory examination: Secondary | ICD-10-CM | POA: Insufficient documentation

## 2013-01-12 HISTORY — DX: Fibromyalgia: M79.7

## 2013-01-12 HISTORY — DX: Personal history of urinary calculi: Z87.442

## 2013-01-12 LAB — CBC
MCH: 30.6 pg (ref 26.0–34.0)
MCHC: 33.6 g/dL (ref 30.0–36.0)
MCV: 91 fL (ref 78.0–100.0)
Platelets: 220 10*3/uL (ref 150–400)
RDW: 12.9 % (ref 11.5–15.5)

## 2013-01-12 LAB — PROTIME-INR: Prothrombin Time: 13 seconds (ref 11.6–15.2)

## 2013-01-12 LAB — SURGICAL PCR SCREEN: MRSA, PCR: NEGATIVE

## 2013-01-18 ENCOUNTER — Other Ambulatory Visit: Payer: Self-pay | Admitting: Orthopedic Surgery

## 2013-01-18 NOTE — Addendum Note (Signed)
Addended by: Janace Litten on: 01/18/2013 07:25 PM   Modules accepted: Orders

## 2013-01-19 ENCOUNTER — Encounter (HOSPITAL_COMMUNITY): Payer: Self-pay | Admitting: Anesthesiology

## 2013-01-19 ENCOUNTER — Inpatient Hospital Stay (HOSPITAL_COMMUNITY): Payer: Medicare Other | Admitting: Anesthesiology

## 2013-01-19 ENCOUNTER — Inpatient Hospital Stay (HOSPITAL_COMMUNITY)
Admission: RE | Admit: 2013-01-19 | Discharge: 2013-01-21 | DRG: 470 | Disposition: A | Discharge status: Home Health | Payer: Medicare Other | Source: Ambulatory Visit | Attending: Orthopedic Surgery | Admitting: Orthopedic Surgery

## 2013-01-19 ENCOUNTER — Encounter (HOSPITAL_COMMUNITY): Payer: Self-pay | Admitting: *Deleted

## 2013-01-19 ENCOUNTER — Encounter (HOSPITAL_COMMUNITY)
Admission: RE | Disposition: A | Discharge status: Home Health | Payer: Self-pay | Source: Ambulatory Visit | Attending: Orthopedic Surgery

## 2013-01-19 ENCOUNTER — Inpatient Hospital Stay (HOSPITAL_COMMUNITY): Payer: Medicare Other

## 2013-01-19 DIAGNOSIS — F329 Major depressive disorder, single episode, unspecified: Secondary | ICD-10-CM | POA: Diagnosis present

## 2013-01-19 DIAGNOSIS — G4730 Sleep apnea, unspecified: Secondary | ICD-10-CM | POA: Diagnosis present

## 2013-01-19 DIAGNOSIS — Z7901 Long term (current) use of anticoagulants: Secondary | ICD-10-CM

## 2013-01-19 DIAGNOSIS — Z87891 Personal history of nicotine dependence: Secondary | ICD-10-CM

## 2013-01-19 DIAGNOSIS — M1711 Unilateral primary osteoarthritis, right knee: Secondary | ICD-10-CM

## 2013-01-19 DIAGNOSIS — Z96649 Presence of unspecified artificial hip joint: Secondary | ICD-10-CM

## 2013-01-19 DIAGNOSIS — Z7982 Long term (current) use of aspirin: Secondary | ICD-10-CM

## 2013-01-19 DIAGNOSIS — E785 Hyperlipidemia, unspecified: Secondary | ICD-10-CM | POA: Diagnosis present

## 2013-01-19 DIAGNOSIS — Z8249 Family history of ischemic heart disease and other diseases of the circulatory system: Secondary | ICD-10-CM

## 2013-01-19 DIAGNOSIS — M1710 Unilateral primary osteoarthritis, unspecified knee: Principal | ICD-10-CM | POA: Diagnosis present

## 2013-01-19 DIAGNOSIS — IMO0001 Reserved for inherently not codable concepts without codable children: Secondary | ICD-10-CM | POA: Diagnosis present

## 2013-01-19 DIAGNOSIS — M76899 Other specified enthesopathies of unspecified lower limb, excluding foot: Secondary | ICD-10-CM | POA: Diagnosis present

## 2013-01-19 DIAGNOSIS — Z79899 Other long term (current) drug therapy: Secondary | ICD-10-CM

## 2013-01-19 DIAGNOSIS — F3289 Other specified depressive episodes: Secondary | ICD-10-CM | POA: Diagnosis present

## 2013-01-19 DIAGNOSIS — Z23 Encounter for immunization: Secondary | ICD-10-CM

## 2013-01-19 HISTORY — PX: TOTAL KNEE ARTHROPLASTY: SHX125

## 2013-01-19 HISTORY — DX: Unilateral primary osteoarthritis, right knee: M17.11

## 2013-01-19 LAB — CBC
HCT: 40.8 % (ref 39.0–52.0)
Hemoglobin: 13.7 g/dL (ref 13.0–17.0)
MCHC: 33.6 g/dL (ref 30.0–36.0)
RBC: 4.5 MIL/uL (ref 4.22–5.81)
WBC: 11.6 10*3/uL — ABNORMAL HIGH (ref 4.0–10.5)

## 2013-01-19 LAB — BASIC METABOLIC PANEL
BUN: 16 mg/dL (ref 6–23)
Chloride: 104 mEq/L (ref 96–112)
GFR calc non Af Amer: 83 mL/min — ABNORMAL LOW (ref >90)
Glucose, Bld: 115 mg/dL — ABNORMAL HIGH (ref 70–99)
Potassium: 4.2 mEq/L (ref 3.5–5.1)

## 2013-01-19 LAB — CREATININE, SERUM
GFR calc Af Amer: >90 mL/min (ref >90)
GFR calc non Af Amer: 86 mL/min — ABNORMAL LOW (ref >90)

## 2013-01-19 SURGERY — ARTHROPLASTY, KNEE, TOTAL
Anesthesia: General | Site: Knee | Laterality: Right | Wound class: Clean

## 2013-01-19 MED ORDER — ASPIRIN 81 MG PO TABS: 81.0000 mg | ORAL_TABLET | Freq: Every day | ORAL | Status: DC

## 2013-01-19 MED ORDER — MIDAZOLAM HCL 5 MG/5ML IJ SOLN
INTRAMUSCULAR | Status: DC | PRN
  Administered 2013-01-19: 2 mg via INTRAVENOUS

## 2013-01-19 MED ORDER — ACETAMINOPHEN 325 MG PO TABS
ORAL_TABLET | ORAL | Status: AC
  Administered 2013-01-19: 650 mg via ORAL
  Filled 2013-01-19: qty 2

## 2013-01-19 MED ORDER — METHYLPREDNISOLONE ACETATE 40 MG/ML IJ SUSP
INTRAMUSCULAR | Status: DC | PRN
  Administered 2013-01-19: 40 mg via INTRA_ARTICULAR

## 2013-01-19 MED ORDER — ONDANSETRON HCL 4 MG PO TABS: 4.0000 mg | ORAL_TABLET | Freq: Four times a day (QID) | ORAL | Status: DC | PRN

## 2013-01-19 MED ORDER — BUPIVACAINE-EPINEPHRINE PF 0.5-1:200000 % IJ SOLN
INTRAMUSCULAR | Status: DC | PRN
  Administered 2013-01-19: 150 mg

## 2013-01-19 MED ORDER — SIMVASTATIN 20 MG PO TABS
20.0000 mg | ORAL_TABLET | Freq: Every day | ORAL | Status: DC
  Administered 2013-01-19 – 2013-01-20 (×2): 20 mg via ORAL
  Filled 2013-01-19 (×3): qty 1

## 2013-01-19 MED ORDER — METHOCARBAMOL 100 MG/ML IJ SOLN
500.0000 mg | Freq: Four times a day (QID) | INTRAVENOUS | Status: DC | PRN
  Filled 2013-01-19: qty 5

## 2013-01-19 MED ORDER — CELECOXIB 200 MG PO CAPS
200.0000 mg | ORAL_CAPSULE | Freq: Every day | ORAL | Status: DC
  Administered 2013-01-19 – 2013-01-21 (×3): 200 mg via ORAL
  Filled 2013-01-19 (×3): qty 1

## 2013-01-19 MED ORDER — METHOCARBAMOL 500 MG PO TABS
ORAL_TABLET | ORAL | Status: AC
  Administered 2013-01-19: 500 mg via ORAL
  Filled 2013-01-19: qty 1

## 2013-01-19 MED ORDER — SENNA 8.6 MG PO TABS
1.0000 | ORAL_TABLET | Freq: Two times a day (BID) | ORAL | Status: DC
  Administered 2013-01-20 – 2013-01-21 (×2): 8.6 mg via ORAL
  Filled 2013-01-19 (×5): qty 1

## 2013-01-19 MED ORDER — ONDANSETRON HCL 4 MG/2ML IJ SOLN: 4.0000 mg | Freq: Four times a day (QID) | INTRAMUSCULAR | Status: DC | PRN

## 2013-01-19 MED ORDER — MAGNESIUM CITRATE PO SOLN
1.0000 | Freq: Once | ORAL | Status: AC | PRN
  Filled 2013-01-19: qty 296

## 2013-01-19 MED ORDER — METOCLOPRAMIDE HCL 5 MG/ML IJ SOLN: 5.0000 mg | Freq: Three times a day (TID) | INTRAMUSCULAR | Status: DC | PRN

## 2013-01-19 MED ORDER — SENNA-DOCUSATE SODIUM 8.6-50 MG PO TABS: 2.0000 | ORAL_TABLET | Freq: Every day | ORAL | Status: DC

## 2013-01-19 MED ORDER — CEFAZOLIN SODIUM-DEXTROSE 2-3 GM-% IV SOLR
INTRAVENOUS | Status: AC
  Filled 2013-01-19: qty 50

## 2013-01-19 MED ORDER — OMEGA-3-ACID ETHYL ESTERS 1 G PO CAPS
2.0000 g | ORAL_CAPSULE | Freq: Every day | ORAL | Status: DC
  Administered 2013-01-20 – 2013-01-21 (×2): 2 g via ORAL
  Filled 2013-01-19 (×3): qty 2

## 2013-01-19 MED ORDER — SUCCINYLCHOLINE CHLORIDE 20 MG/ML IJ SOLN
INTRAMUSCULAR | Status: DC | PRN
  Administered 2013-01-19: 100 mg via INTRAVENOUS

## 2013-01-19 MED ORDER — ACETAMINOPHEN 325 MG PO TABS
650.0000 mg | ORAL_TABLET | Freq: Four times a day (QID) | ORAL | Status: DC | PRN
  Administered 2013-01-20: 650 mg via ORAL
  Filled 2013-01-19: qty 2

## 2013-01-19 MED ORDER — OXYCODONE-ACETAMINOPHEN 10-325 MG PO TABS: 1.0000 | ORAL_TABLET | Freq: Four times a day (QID) | ORAL | Status: DC | PRN

## 2013-01-19 MED ORDER — OXYCODONE HCL 5 MG PO TABS: 5.0000 mg | ORAL_TABLET | Freq: Once | ORAL | Status: DC | PRN

## 2013-01-19 MED ORDER — DIPHENHYDRAMINE HCL 12.5 MG/5ML PO ELIX: 12.5000 mg | ORAL_SOLUTION | ORAL | Status: DC | PRN

## 2013-01-19 MED ORDER — CEFAZOLIN SODIUM-DEXTROSE 2-3 GM-% IV SOLR
2.0000 g | INTRAVENOUS | Status: AC
  Administered 2013-01-19: 2 g via INTRAVENOUS

## 2013-01-19 MED ORDER — PROMETHAZINE HCL 25 MG/ML IJ SOLN: 6.2500 mg | INTRAMUSCULAR | Status: DC | PRN

## 2013-01-19 MED ORDER — ENOXAPARIN SODIUM 30 MG/0.3ML ~~LOC~~ SOLN: 30.0000 mg | Freq: Two times a day (BID) | SUBCUTANEOUS | Status: DC

## 2013-01-19 MED ORDER — HYDROMORPHONE HCL PF 1 MG/ML IJ SOLN
INTRAMUSCULAR | Status: AC
  Administered 2013-01-19: 0.5 mg via INTRAVENOUS
  Filled 2013-01-19: qty 1

## 2013-01-19 MED ORDER — FENTANYL CITRATE 0.05 MG/ML IJ SOLN
INTRAMUSCULAR | Status: DC | PRN
  Administered 2013-01-19 (×5): 50 ug via INTRAVENOUS
  Administered 2013-01-19: 100 ug via INTRAVENOUS
  Administered 2013-01-19 (×2): 50 ug via INTRAVENOUS

## 2013-01-19 MED ORDER — SODIUM CHLORIDE 0.9 % IR SOLN
Status: DC | PRN
  Administered 2013-01-19: 3000 mL

## 2013-01-19 MED ORDER — METHYLPREDNISOLONE ACETATE 40 MG/ML IJ SUSP
INTRAMUSCULAR | Status: AC
  Filled 2013-01-19: qty 1

## 2013-01-19 MED ORDER — NEOSTIGMINE METHYLSULFATE 1 MG/ML IJ SOLN
INTRAMUSCULAR | Status: DC | PRN
  Administered 2013-01-19: 3 mg via INTRAVENOUS

## 2013-01-19 MED ORDER — DOCUSATE SODIUM 100 MG PO CAPS
100.0000 mg | ORAL_CAPSULE | Freq: Two times a day (BID) | ORAL | Status: DC
  Administered 2013-01-20 – 2013-01-21 (×2): 100 mg via ORAL
  Filled 2013-01-19 (×5): qty 1

## 2013-01-19 MED ORDER — ESCITALOPRAM OXALATE 10 MG PO TABS
10.0000 mg | ORAL_TABLET | Freq: Every day | ORAL | Status: DC
  Administered 2013-01-20 – 2013-01-21 (×2): 10 mg via ORAL
  Filled 2013-01-19 (×2): qty 1

## 2013-01-19 MED ORDER — ASPIRIN EC 81 MG PO TBEC
81.0000 mg | DELAYED_RELEASE_TABLET | Freq: Every day | ORAL | Status: DC
  Administered 2013-01-20 – 2013-01-21 (×2): 81 mg via ORAL
  Filled 2013-01-19 (×2): qty 1

## 2013-01-19 MED ORDER — HYDROMORPHONE HCL PF 1 MG/ML IJ SOLN
0.2500 mg | INTRAMUSCULAR | Status: DC | PRN
  Administered 2013-01-19 (×2): 0.5 mg via INTRAVENOUS

## 2013-01-19 MED ORDER — LACTATED RINGERS IV SOLN
INTRAVENOUS | Status: DC | PRN
  Administered 2013-01-19 (×3): via INTRAVENOUS

## 2013-01-19 MED ORDER — LIDOCAINE HCL (PF) 1 % IJ SOLN
INTRAMUSCULAR | Status: AC
  Filled 2013-01-19: qty 30

## 2013-01-19 MED ORDER — POTASSIUM CHLORIDE IN NACL 20-0.45 MEQ/L-% IV SOLN
INTRAVENOUS | Status: DC
  Administered 2013-01-19: 20:00:00 via INTRAVENOUS
  Filled 2013-01-19 (×6): qty 1000

## 2013-01-19 MED ORDER — PROPOFOL 10 MG/ML IV BOLUS
INTRAVENOUS | Status: DC | PRN
  Administered 2013-01-19: 30 mg via INTRAVENOUS
  Administered 2013-01-19: 50 mg via INTRAVENOUS
  Administered 2013-01-19: 150 mg via INTRAVENOUS

## 2013-01-19 MED ORDER — CEFAZOLIN SODIUM-DEXTROSE 2-3 GM-% IV SOLR
2.0000 g | Freq: Four times a day (QID) | INTRAVENOUS | Status: AC
  Administered 2013-01-19 (×2): 2 g via INTRAVENOUS
  Filled 2013-01-19 (×2): qty 50

## 2013-01-19 MED ORDER — ALUM & MAG HYDROXIDE-SIMETH 200-200-20 MG/5ML PO SUSP: 30.0000 mL | ORAL | Status: DC | PRN

## 2013-01-19 MED ORDER — ROCURONIUM BROMIDE 100 MG/10ML IV SOLN
INTRAVENOUS | Status: DC | PRN
  Administered 2013-01-19: 30 mg via INTRAVENOUS
  Administered 2013-01-19: 10 mg via INTRAVENOUS

## 2013-01-19 MED ORDER — ACETAMINOPHEN 650 MG RE SUPP: 650.0000 mg | Freq: Four times a day (QID) | RECTAL | Status: DC | PRN

## 2013-01-19 MED ORDER — METHOCARBAMOL 500 MG PO TABS
500.0000 mg | ORAL_TABLET | Freq: Four times a day (QID) | ORAL | Status: DC | PRN
  Administered 2013-01-19 – 2013-01-21 (×4): 500 mg via ORAL
  Filled 2013-01-19 (×4): qty 1

## 2013-01-19 MED ORDER — POLYETHYLENE GLYCOL 3350 17 G PO PACK: 17.0000 g | PACK | Freq: Every day | ORAL | Status: DC | PRN

## 2013-01-19 MED ORDER — ENOXAPARIN SODIUM 30 MG/0.3ML ~~LOC~~ SOLN
30.0000 mg | Freq: Two times a day (BID) | SUBCUTANEOUS | Status: DC
  Administered 2013-01-20 – 2013-01-21 (×3): 30 mg via SUBCUTANEOUS
  Filled 2013-01-19 (×6): qty 0.3

## 2013-01-19 MED ORDER — OXYCODONE HCL 5 MG PO TABS
5.0000 mg | ORAL_TABLET | ORAL | Status: DC | PRN
  Administered 2013-01-19 – 2013-01-21 (×9): 10 mg via ORAL
  Filled 2013-01-19 (×9): qty 2

## 2013-01-19 MED ORDER — LIDOCAINE HCL 1 % IJ SOLN
INTRAMUSCULAR | Status: DC | PRN
  Administered 2013-01-19: 10 mL

## 2013-01-19 MED ORDER — ZOLPIDEM TARTRATE 5 MG PO TABS
5.0000 mg | ORAL_TABLET | Freq: Every evening | ORAL | Status: DC | PRN
  Administered 2013-01-19: 5 mg via ORAL
  Filled 2013-01-19: qty 1

## 2013-01-19 MED ORDER — GLYCOPYRROLATE 0.2 MG/ML IJ SOLN
INTRAMUSCULAR | Status: DC | PRN
  Administered 2013-01-19: 0.4 mg via INTRAVENOUS

## 2013-01-19 MED ORDER — PNEUMOCOCCAL VAC POLYVALENT 25 MCG/0.5ML IJ INJ
0.5000 mL | INJECTION | INTRAMUSCULAR | Status: AC
  Administered 2013-01-21: 0.5 mL via INTRAMUSCULAR
  Filled 2013-01-19: qty 0.5

## 2013-01-19 MED ORDER — MENTHOL 3 MG MT LOZG: 1.0000 | LOZENGE | OROMUCOSAL | Status: DC | PRN

## 2013-01-19 MED ORDER — ONDANSETRON HCL 4 MG/2ML IJ SOLN
INTRAMUSCULAR | Status: DC | PRN
  Administered 2013-01-19: 4 mg via INTRAVENOUS

## 2013-01-19 MED ORDER — PHENOL 1.4 % MT LIQD: 1.0000 | OROMUCOSAL | Status: DC | PRN

## 2013-01-19 MED ORDER — HYDROMORPHONE HCL PF 1 MG/ML IJ SOLN
0.5000 mg | INTRAMUSCULAR | Status: DC | PRN
  Administered 2013-01-19 – 2013-01-20 (×4): 0.5 mg via INTRAVENOUS
  Filled 2013-01-19 (×4): qty 1

## 2013-01-19 MED ORDER — OXYCODONE HCL 5 MG/5ML PO SOLN: 5.0000 mg | Freq: Once | ORAL | Status: DC | PRN

## 2013-01-19 MED ORDER — BISACODYL 10 MG RE SUPP: 10.0000 mg | Freq: Every day | RECTAL | Status: DC | PRN

## 2013-01-19 MED ORDER — METOCLOPRAMIDE HCL 10 MG PO TABS: 5.0000 mg | ORAL_TABLET | Freq: Three times a day (TID) | ORAL | Status: DC | PRN

## 2013-01-19 SURGICAL SUPPLY — 61 items
BANDAGE ELASTIC 6 VELCRO ST LF (GAUZE/BANDAGES/DRESSINGS) ×2 IMPLANT
BANDAGE ESMARK 6X9 LF (GAUZE/BANDAGES/DRESSINGS) ×1 IMPLANT
BENZOIN TINCTURE PRP APPL 2/3 (GAUZE/BANDAGES/DRESSINGS) ×2 IMPLANT
BLADE SAG 18X100X1.27 (BLADE) ×2 IMPLANT
BLADE SAW RECIP 87.9 MT (BLADE) ×2 IMPLANT
BLADE SAW SGTL 13X75X1.27 (BLADE) ×2 IMPLANT
BNDG ESMARK 6X9 LF (GAUZE/BANDAGES/DRESSINGS) ×2
BOOTCOVER CLEANROOM LRG (PROTECTIVE WEAR) ×4 IMPLANT
BOWL SMART MIX CTS (DISPOSABLE) ×2 IMPLANT
CAPT FB KNEE W/COCR XLINK ×2 IMPLANT
CEMENT HV SMART SET (Cement) ×4 IMPLANT
CLOTH BEACON ORANGE TIMEOUT ST (SAFETY) ×2 IMPLANT
CLSR STERI-STRIP ANTIMIC 1/2X4 (GAUZE/BANDAGES/DRESSINGS) ×2 IMPLANT
COVER SURGICAL LIGHT HANDLE (MISCELLANEOUS) ×2 IMPLANT
CUFF TOURNIQUET SINGLE 34IN LL (TOURNIQUET CUFF) IMPLANT
DRAPE EXTREMITY T 121X128X90 (DRAPE) ×2 IMPLANT
DRAPE PROXIMA HALF (DRAPES) ×2 IMPLANT
DRAPE U-SHAPE 47X51 STRL (DRAPES) ×2 IMPLANT
DRSG PAD ABDOMINAL 8X10 ST (GAUZE/BANDAGES/DRESSINGS) ×2 IMPLANT
DURAPREP 26ML APPLICATOR (WOUND CARE) ×2 IMPLANT
ELECT CAUTERY BLADE 6.4 (BLADE) ×2 IMPLANT
ELECT REM PT RETURN 9FT ADLT (ELECTROSURGICAL) ×2
ELECTRODE REM PT RTRN 9FT ADLT (ELECTROSURGICAL) ×1 IMPLANT
FACESHIELD LNG OPTICON STERILE (SAFETY) ×2 IMPLANT
GLOVE BIOGEL PI ORTHO PRO SZ8 (GLOVE) ×2
GLOVE ORTHO TXT STRL SZ7.5 (GLOVE) ×2 IMPLANT
GLOVE PI ORTHO PRO STRL SZ8 (GLOVE) ×2 IMPLANT
GLOVE SURG ORTHO 8.0 STRL STRW (GLOVE) ×2 IMPLANT
GOWN BRE IMP PREV XXLGXLNG (GOWN DISPOSABLE) ×2 IMPLANT
GOWN STRL REIN XL XLG (GOWN DISPOSABLE) ×2 IMPLANT
HANDPIECE INTERPULSE COAX TIP (DISPOSABLE) ×1
HOOD PEEL AWAY FACE SHEILD DIS (HOOD) ×4 IMPLANT
IMMOBILIZER KNEE 22 UNIV (SOFTGOODS) ×2 IMPLANT
KIT BASIN OR (CUSTOM PROCEDURE TRAY) ×2 IMPLANT
KIT ROOM TURNOVER OR (KITS) ×2 IMPLANT
MANIFOLD NEPTUNE II (INSTRUMENTS) ×2 IMPLANT
NEEDLE 18GX1X1/2 (RX/OR ONLY) (NEEDLE) ×2 IMPLANT
NEEDLE SPNL 18GX3.5 QUINCKE PK (NEEDLE) ×2 IMPLANT
NS IRRIG 1000ML POUR BTL (IV SOLUTION) ×2 IMPLANT
PACK TOTAL JOINT (CUSTOM PROCEDURE TRAY) ×2 IMPLANT
PAD ARMBOARD 7.5X6 YLW CONV (MISCELLANEOUS) ×4 IMPLANT
PAD CAST 4YDX4 CTTN HI CHSV (CAST SUPPLIES) ×1 IMPLANT
PADDING CAST COTTON 4X4 STRL (CAST SUPPLIES) ×1
PADDING CAST COTTON 6X4 STRL (CAST SUPPLIES) ×2 IMPLANT
SET HNDPC FAN SPRY TIP SCT (DISPOSABLE) ×1 IMPLANT
SPONGE GAUZE 4X4 12PLY (GAUZE/BANDAGES/DRESSINGS) ×2 IMPLANT
STAPLER VISISTAT 35W (STAPLE) ×2 IMPLANT
STRIP CLOSURE SKIN 1/2X4 (GAUZE/BANDAGES/DRESSINGS) ×2 IMPLANT
SUCTION FRAZIER TIP 10 FR DISP (SUCTIONS) ×2 IMPLANT
SUT MNCRL AB 4-0 PS2 18 (SUTURE) IMPLANT
SUT VIC AB 0 CT1 27 (SUTURE) ×1
SUT VIC AB 0 CT1 27XBRD ANBCTR (SUTURE) ×1 IMPLANT
SUT VIC AB 2-0 CT1 27 (SUTURE) ×1
SUT VIC AB 2-0 CT1 TAPERPNT 27 (SUTURE) ×1 IMPLANT
SUT VIC AB 3-0 SH 8-18 (SUTURE) ×4 IMPLANT
SYR 30ML LL (SYRINGE) ×2 IMPLANT
SYRINGE 10CC LL (SYRINGE) ×2 IMPLANT
TOWEL OR 17X24 6PK STRL BLUE (TOWEL DISPOSABLE) ×2 IMPLANT
TOWEL OR 17X26 10 PK STRL BLUE (TOWEL DISPOSABLE) ×2 IMPLANT
TRAY FOLEY CATH 14FR (SET/KITS/TRAYS/PACK) ×2 IMPLANT
WATER STERILE IRR 1000ML POUR (IV SOLUTION) ×4 IMPLANT

## 2013-01-19 NOTE — Anesthesia Postprocedure Evaluation (Signed)
Anesthesia Post Note  Patient: Taylor Bean  Procedure(s) Performed: Procedure(s) (LRB): TOTAL KNEE ARTHROPLASTY with right trocanteric bursal injection  (Right)  Anesthesia type: general  Patient location: PACU  Post pain: Pain level controlled  Post assessment: Patient's Cardiovascular Status Stable  Last Vitals:  Filed Vitals:   01/19/13 1010  BP:   Pulse:   Temp: 36.8 C  Resp:     Post vital signs: Reviewed and stable  Level of consciousness: sedated  Complications: No apparent anesthesia complications

## 2013-01-19 NOTE — Anesthesia Procedure Notes (Addendum)
Anesthesia Regional Block:  Femoral nerve block  Pre-Anesthetic Checklist: ,, timeout performed, Correct Patient, Correct Site, Correct Laterality, Correct Procedure, Correct Position, site marked, Risks and benefits discussed,  Surgical consent,  Pre-op evaluation,  At surgeon's request and post-op pain management  Laterality: Left  Prep: chloraprep       Needles:  Injection technique: Single-shot  Needle Type: Echogenic Stimulator Needle     Needle Length: 5cm 5 cm Needle Gauge: 22 and 22 G    Additional Needles:  Procedures: ultrasound guided (picture in chart) and nerve stimulator Femoral nerve block  Nerve Stimulator or Paresthesia:  Response: quadraceps contraction, 0.45 mA,   Additional Responses:   Narrative:  Start time: 01/19/2013 7:13 AM End time: 01/19/2013 7:23 AM Injection made incrementally with aspirations every 5 mL.  Performed by: Personally  Anesthesiologist: Halford Decamp, MD  Additional Notes: Functioning IV was confirmed and monitors were applied.  A 50mm 22ga Arrow echogenic stimulator needle was used. Sterile prep and drape,hand hygiene and sterile gloves were used. Ultrasound guidance: relevant anatomy identified, needle position confirmed, local anesthetic spread visualized around nerve(s)., vascular puncture avoided.  Image printed for medical record. Negative aspiration and negative test dose prior to incremental administration of local anesthetic. The patient tolerated the procedure well.    Femoral nerve block Procedure Name: Intubation Date/Time: 01/19/2013 7:41 AM Performed by: Romie Minus K Pre-anesthesia Checklist: Patient identified, Emergency Drugs available, Suction available, Patient being monitored and Timeout performed Patient Re-evaluated:Patient Re-evaluated prior to inductionOxygen Delivery Method: Circle system utilized Preoxygenation: Pre-oxygenation with 100% oxygen Intubation Type: IV induction Ventilation: Mask  ventilation without difficulty Grade View: Grade I Tube size: 8.0 mm Number of attempts: 1 Airway Equipment and Method: Stylet and Video-laryngoscopy Placement Confirmation: ETT inserted through vocal cords under direct vision,  positive ETCO2,  CO2 detector and breath sounds checked- equal and bilateral Secured at: 22 cm Tube secured with: Tape Dental Injury: Teeth and Oropharynx as per pre-operative assessment

## 2013-01-19 NOTE — Op Note (Signed)
DATE OF SURGERY:  01/19/2013 TIME: 9:40 AM  PATIENT NAME:  Taylor Bean   AGE: 72 y.o.    PRE-OPERATIVE DIAGNOSIS:  DJD RIGHT KNEE, RIGHT HIP BURSITIS  POST-OPERATIVE DIAGNOSIS:  Same  PROCEDURE:  Procedure(s): TOTAL KNEE ARTHROPLASTY with right trocanteric bursal injection    SURGEON:  Eulas Post, MD   ASSISTANT:  Janace Litten, OPA-C, present and scrubbed throughout the case, critical for assistance with exposure, retraction, instrumentation, and closure.   OPERATIVE IMPLANTS: Depuy PFC Sigma, Posterior Stabilized.  Femur size 5, Tibia size 4, Patella size 3-peg oval button, with a 10 mm polyethylene insert.   PREOPERATIVE INDICATIONS:  Taylor Bean is a 72 y.o. year old male with end stage bone on bone degenerative arthritis of the knee who failed conservative treatment, including injections, antiinflammatories, activity modification, and assistive devices, and had significant impairment of their activities of daily living, and elected for Total Knee Arthroplasty.   The risks, benefits, and alternatives were discussed at length including but not limited to the risks of infection, bleeding, nerve injury, stiffness, blood clots, the need for revision surgery, cardiopulmonary complications, among others, and they were willing to proceed.   OPERATIVE DESCRIPTION:  The patient was brought to the operative room and placed in a supine position.  General anesthesia was administered.  IV antibiotics were given.  The right hip was prepped with chlorhexidine, and an 18-gauge spinal needle was used to inject 9 cc of Xylocaine and 1 cc of Depo-Medrol into the trochanteric bursa.  The lower extremity was then prepped and draped in the usual sterile fashion for the knee arthroplasty.  Time out was performed.  The leg was elevated and exsanguinated and the tourniquet was inflated.  Anterior quadriceps tendon splitting approach was performed.  The patella was everted and osteophytes  were removed.  The anterior horn of the medial and lateral meniscus was removed.   The distal femur was opened with the drill and the intramedullary distal femoral cutting jig was utilized, set at 5 degrees resecting 10 mm off the distal femur.  Care was taken to protect the collateral ligaments.  Then the extramedullary tibial cutting jig was utilized making the appropriate cut using the anterior tibial crest as a reference building in appropriate posterior slope.  Care was taken during the cut to protect the medial and collateral ligaments.  The proximal tibia was removed along with the posterior horns of the menisci.  The PCL was sacrificed.  The extension gap was fairly tight, and I went back and resected 2 mm more off of the tibia.  The extensor gap was measured and was then approximately 10mm.    The distal femoral sizing jig was applied, taking care to avoid notching.  The anterior flange is extremely flat, but did measured to a size 5. The size 4 would've notched, although the anterior femoral condyle was extremely worn from the chronic degenerative disease, such that the cut did not take that much bone. Then the 4-in-1 cutting jig was applied and the anterior and posterior femur was cut, along with the chamfer cuts.  All posterior osteophytes were removed.  The flexion gap was then measured and was symmetric with the extension gap.  I completed the distal femoral preparation using the appropriate jig to prepare the box.  The proximal tibia sized and prepared accordingly with the reamer and the punch, and then all components were trialed with the 10mm poly insert.  The knee was found to have excellent  balance and full motion.    The patella was then measured, and cut with the saw.  This was the most challenging part of the case, as the patella measured 20 mm in thickness before the cut, however this was with about a 7 or 8 mm concavity that was not included in the measurement. The actual  thickness of the patella in the center was probably only 14 millimeters. Additionally, the patella had been tracking outside of the trochlear groove or 4 decades, and had been hollowed out by the arc of the lateral femoral condyle, and was completely congruent with this location. I set the jig at 14 mm, resected the bone, and is barely scraped the deepest portion of the clamshell concavity of the patella. I did have a smooth congruent surface however, and used a 41 mm button, and slightly medialized the button in order to allow for the compensatory chronic lateral patellar tracking.  The above named components were then cemented into place and all excess cement was removed.  The real polyethylene implant was placed.  The knee was easily taken through a range of motion and the patella tracked remarkably well, and even better than I expected, using a no thumb technique.  The knee was irrigated copiously and the parapatellar and subcutaneous tissue closed with vicryl, and monocryl with steri strips for the skin.  The wounds were injected with marcaine, and dressed with sterile gauze and the tourniquet released and the patient was awakened and returned to the PACU in stable and satisfactory condition.  There were no complications.  Total tourniquet time was ~100 minutes.

## 2013-01-19 NOTE — H&P (Signed)
PREOPERATIVE H&P  Chief Complaint: DJD RIGHT KNEE, RT HIP BURSITIS  HPI: Taylor Bean is a 72 y.o. male who presents for preoperative history and physical with a diagnosis of DJD RIGHT KNEE, RT HIP BURSITIS. Symptoms are rated as moderate to severe, and have been worsening.  This is significantly impairing activities of daily living.  He has elected for surgical management.   Past Medical History  Diagnosis Date  . Hyperlipidemia   . Sleep apnea     uses a cpap-will bring dos  . Arthritis     general  . Depression   . Right rotator cuff tear 12/13/2011  . H/O urinary stone   . Fibromyalgia    Past Surgical History  Procedure Laterality Date  . 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 Joint replacement  2009    rt total hip  . Tonsillectomy    . Leg surgery      fx rt leg as 72 yr old  . Colonoscopy w/ polypectomy      begin  . Rotator cuff repair Right 4/31/13   History   Social History  . Marital Status: Married    Spouse Name: N/A    Number of Children: N/A  . Years of Education: N/A   Social History Main Topics  . Smoking status: Former Smoker -- 0.25 packs/day for 10 years    Types: Cigarettes    Quit date: 08/01/1968  . Smokeless tobacco: None  . Alcohol Use: 0.6 oz/week    1 Cans of beer per week  . Drug Use: No  . Sexually Active: None   Other Topics Concern  . None   Social History Narrative  . None   Family History  Problem Relation Age of Onset  . Heart attack Father    Allergies  Allergen Reactions  . Other     Standard Epoxy resin used for joint replacement   . Adhesive (Tape) Hives, Itching and Rash   Prior to Admission medications   Medication Sig Start Date End Date Taking? Authorizing Provider  aspirin 81 MG tablet Take 81 mg by mouth daily.     Yes Historical Provider, MD  celecoxib (CELEBREX) 200 MG capsule Take 200 mg by mouth daily.   Yes Historical Provider, MD  escitalopram (LEXAPRO) 10 MG tablet Take 10 mg by mouth daily.     Yes Historical Provider, MD  Omega-3 Fatty Acids (FISH OIL PO) Take 1 capsule by mouth 2 (two) times daily.   Yes Historical Provider, MD  simvastatin (ZOCOR) 20 MG tablet Take 20 mg by mouth at bedtime.     Yes Historical Provider, MD     Positive ROS: All other systems have been reviewed and were otherwise negative with the exception of those mentioned in the HPI and as above.  Physical Exam: General: Alert, no acute distress Cardiovascular: No pedal edema Respiratory: No cyanosis, no use of accessory musculature GI: No organomegaly, abdomen is soft and non-tender Skin: No lesions in the area of chief complaint Neurologic: Sensation intact distally Psychiatric: Patient is competent for consent with normal mood and affect Lymphatic: No axillary or cervical lymphadenopathy  MUSCULOSKELETAL: right knee has painful motion, positive crepitance, varus alignment, moderate effusion ROM 5-110.  XR with end stage degenerative knee osteoarthritis.  Assessment: DJD RIGHT KNEE, RT HIP BURSITIS  Plan: Plan for Procedure(s): TOTAL KNEE ARTHROPLASTY and right hip trochanteric injection.  The risks benefits and alternatives were discussed with the patient including  but not limited to the risks of nonoperative treatment, versus surgical intervention including infection, bleeding, nerve injury,  blood clots, cardiopulmonary complications, morbidity, mortality, among others, and they were willing to proceed.   Eulas Post, MD Cell [phone]   01/19/2013 7:18 AM

## 2013-01-19 NOTE — Transfer of Care (Signed)
Immediate Anesthesia Transfer of Care Note  Patient: Taylor Bean  Procedure(s) Performed: Procedure(s): TOTAL KNEE ARTHROPLASTY with right trocanteric bursal injection  (Right)  Patient Location: PACU  Anesthesia Type:General  Level of Consciousness: awake, alert , oriented and patient cooperative  Airway & Oxygen Therapy: Patient Spontanous Breathing and Patient connected to face mask oxygen  Post-op Assessment: Report given to PACU RN and Post -op Vital signs reviewed and stable  Post vital signs: Reviewed and stable  Complications: No apparent anesthesia complications

## 2013-01-19 NOTE — Evaluation (Signed)
Physical Therapy Evaluation Patient Details Name: Taylor Bean MRN: [id number] DOB: [birth year] Today's Date: 01/19/2013 Time: 6213-0865 PT Time Calculation (min): 38 min  PT Assessment / Plan / Recommendation History of Present Illness  R TKA  Clinical Impression  Pt tolerated OOB well despite report of 10/10 R knee pain upon PT arrival. Anticipate pt to be safe for d/c home with HHPT, use of RW and assist of spouse.    PT Assessment  Patient needs continued PT services    Follow Up Recommendations  Home health PT;Supervision/Assistance - 24 hour    Does the patient have the potential to tolerate intense rehabilitation      Barriers to Discharge   stairs to bed/bath    Equipment Recommendations  Rolling walker with 5" wheels;3in1 (PT)    Recommendations for Other Services     Frequency 7X/week    Precautions / Restrictions Precautions Precautions: Knee Precaution Booklet Issued: Yes (comment) Required Braces or Orthoses: Knee Immobilizer - Right Knee Immobilizer - Right: Discontinue post op day 2 Restrictions Weight Bearing Restrictions: Yes RLE Weight Bearing: Weight bearing as tolerated   Pertinent Vitals/Pain 7/10 R knee pain s/p PT      Mobility  Bed Mobility Bed Mobility: Supine to Sit Supine to Sit: 4: Min assist;HOB flat Details for Bed Mobility Assistance: long sit technique with max v/c's  Transfers Transfers: Sit to Stand;Stand to Sit Sit to Stand: 1: +2 Total assist;From elevated surface;From bed Sit to Stand: Patient Percentage: 60% Details for Transfer Assistance: v/c's for hand placement, pt able to initiate but require assist x 2 to achieve full upright standing. elevating bed helped Ambulation/Gait Ambulation/Gait Assistance: 4: Min assist (2nd person for chair follow) Ambulation Distance (Feet): 10 Feet Assistive device: Rolling walker Ambulation/Gait Assistance Details: max v/c's for sequencing Gait Pattern: Step-to pattern;Decreased step  length - right;Decreased stance time - right;Antalgic Gait velocity: slow    Exercises Total Joint Exercises Ankle Circles/Pumps: PROM;10 reps;Supine Quad Sets: AROM;Right;10 reps;Supine Heel Slides: AAROM;Right;10 reps;Supine Goniometric ROM: 10 deg R knee flex   PT Diagnosis: Difficulty walking  PT Problem List: Decreased strength;Decreased range of motion;Decreased activity tolerance;Decreased mobility;Decreased knowledge of use of DME PT Treatment Interventions: DME instruction;Gait training;Stair training;Functional mobility training;Therapeutic activities;Therapeutic exercise     PT Goals(Current goals can be found in the care plan section) Acute Rehab PT Goals Patient Stated Goal: home PT Goal Formulation: With patient/family Time For Goal Achievement: 01/26/13 Potential to Achieve Goals: Good Additional Goals Additional Goal #1: Indep with HEP  Visit Information  Last PT Received On: 01/19/13 Assistance Needed: +1 (+2 for sit to stand only) History of Present Illness: R TKA       Prior Functioning  Home Living Family/patient expects to be discharged to:: Private residence Living Arrangements: Spouse/significant other Available Help at Discharge: Family;Available 24 hours/day Type of Home: House Home Access: Stairs to enter [employer] of Steps: 1 Home Layout: Multi-level Alternate Level Stairs-Number of Steps: 7 Alternate Level Stairs-Rails: Left Additional Comments: pt can stay on 1 floor, would sleep in a recliner but would need BSC Prior Function Level of Independence: Independent Communication Communication: No difficulties Dominant Hand: Right    Cognition  Cognition Arousal/Alertness: Awake/alert Behavior During Therapy: WFL for tasks assessed/performed Overall Cognitive Status: Within Functional Limits for tasks assessed    Extremity/Trunk Assessment Upper Extremity Assessment Upper Extremity Assessment: Overall WFL for tasks  assessed Lower Extremity Assessment Lower Extremity Assessment: RLE deficits/detail RLE Deficits / Details: able to initiate quad set,  tolerate 10 deg of AAROM to knee Cervical / Trunk Assessment Cervical / Trunk Assessment: Normal   Balance    End of Session PT - End of Session Equipment Utilized During Treatment: Gait belt;Right knee immobilizer Activity Tolerance: Patient limited by pain Patient left: in chair;with call bell/phone within reach;with family/visitor present Nurse Communication: Mobility status  GP     Marcene Brawn 01/19/2013, 4:18 PM  Lewis Shock, PT, DPT Pager #: [phone] Office #: [phone]

## 2013-01-19 NOTE — Plan of Care (Signed)
Problem: Consults Goal: Diagnosis- Total Joint Replacement Primary Total Knee Right     

## 2013-01-19 NOTE — Progress Notes (Signed)
UR COMPLETED  

## 2013-01-19 NOTE — OR Nursing (Signed)
Per patient, allergy to epoxy used for total joints, after checking with Dr. Dion Saucier and patient joint cement was considered ok to use.

## 2013-01-19 NOTE — Anesthesia Preprocedure Evaluation (Addendum)
Anesthesia Evaluation  Patient identified by MRN, date of birth, ID band Patient awake    Reviewed: Allergy & Precautions, H&P , NPO status , Patient's Chart, lab work & pertinent test results  History of Anesthesia Complications Negative for: history of anesthetic complications  Airway Mallampati: III TM Distance: >3 FB Neck ROM: Full    Dental  (+) Teeth Intact and Dental Advisory Given   Pulmonary sleep apnea and Continuous Positive Airway Pressure Ventilation , former smoker,          Cardiovascular negative cardio ROS  Rhythm:Regular Rate:Normal     Neuro/Psych PSYCHIATRIC DISORDERS Depression negative neurological ROS     GI/Hepatic negative GI ROS, Neg liver ROS,   Endo/Other  negative endocrine ROS  Renal/GU negative Renal ROS     Musculoskeletal  (+) Fibromyalgia -  Abdominal   Peds  Hematology negative hematology ROS (+)   Anesthesia Other Findings   Reproductive/Obstetrics negative OB ROS                          Anesthesia Physical Anesthesia Plan  ASA: III  Anesthesia Plan: General   Post-op Pain Management:    Induction: Intravenous  Airway Management Planned: LMA  Additional Equipment:   Intra-op Plan:   Post-operative Plan: Extubation in OR  Informed Consent: I have reviewed the patients History and Physical, chart, labs and discussed the procedure including the risks, benefits and alternatives for the proposed anesthesia with the patient or authorized representative who has indicated his/her understanding and acceptance.   Dental advisory given  Plan Discussed with: CRNA, Anesthesiologist and Surgeon  Anesthesia Plan Comments:        Anesthesia Quick Evaluation

## 2013-01-19 NOTE — Preoperative (Signed)
Beta Blockers   Reason not to administer Beta Blockers:Not Applicable 

## 2013-01-20 LAB — BASIC METABOLIC PANEL
CO2: 30 mEq/L (ref 19–32)
Calcium: 9 mg/dL (ref 8.4–10.5)
GFR calc non Af Amer: 86 mL/min — ABNORMAL LOW (ref >90)
Sodium: 137 mEq/L (ref 135–145)

## 2013-01-20 LAB — CBC
MCH: 30.6 pg (ref 26.0–34.0)
MCHC: 33.4 g/dL (ref 30.0–36.0)
Platelets: 230 10*3/uL (ref 150–400)
RBC: 4.31 MIL/uL (ref 4.22–5.81)

## 2013-01-20 NOTE — Progress Notes (Signed)
Patient was able to spontaneously void 350cc of urine. We will continue to monitor.

## 2013-01-20 NOTE — Progress Notes (Signed)
Patient ID: QAADIR KENT, male   DOB: 26-Apr-1941, 72 y.o.   MRN: [id number]     Subjective:  Patient reports pain as mild to moderate.  Patient states that he is doing okay but was not prepared for the pain and did not rest well last night.  Objective:   VITALS:   Filed Vitals:   01/19/13 2124 01/20/13 0148 01/20/13 0211 01/20/13 0504  BP: 132/54 141/60  140/56  Pulse: 81 68 70 78  Temp: 98.9 F (37.2 C) 99.5 F (37.5 C)  98.8 F (37.1 C)  TempSrc:      Resp: 16 16  16   SpO2: 95% 94% 99% 93%    ABD soft Sensation intact distally Dorsiflexion/Plantar flexion intact Incision: dressing C/D/I and no drainage   Lab Results  Component Value Date   WBC 9.5 01/20/2013   HGB 13.2 01/20/2013   HCT 39.5 01/20/2013   MCV 91.6 01/20/2013   PLT 230 01/20/2013     Assessment/Plan: 1 Day Post-Op   Principal Problem:   Osteoarthritis of right knee   Advance diet Up with therapy Continue pain and bowel regimen  Plan for dressing change tomorrow Plan for DC tomorrow or Friday   Haskel Khan 01/20/2013, 8:05 AM   Teryl Lucy, MD Cell [phone]

## 2013-01-20 NOTE — Progress Notes (Signed)
Physical Therapy Treatment Patient Details Name: Taylor Bean MRN: [id number] DOB: [birth year] Today's Date: 01/20/2013 Time: 1002-1030 PT Time Calculation (min): 28 min  PT Assessment / Plan / Recommendation  PT Comments   Patient progressing well this morning with ambulation. Stated that he had been up in the recliner all morning as he cannot sleep in the bed. Patient has recliner downstairs at his home that he plans to sleep in initially  Follow Up Recommendations  Home health PT;Supervision/Assistance - 24 hour     Does the patient have the potential to tolerate intense rehabilitation     Barriers to Discharge        Equipment Recommendations  Rolling walker with 5" wheels;3in1 (PT)    Recommendations for Other Services    Frequency 7X/week   Progress towards PT Goals Progress towards PT goals: Progressing toward goals  Plan Current plan remains appropriate    Precautions / Restrictions Precautions Precautions: Knee Required Braces or Orthoses: Knee Immobilizer - Right Restrictions RLE Weight Bearing: Weight bearing as tolerated   Pertinent Vitals/Pain 7/10 R knee pain. RN provided medication to assist with pain control     Mobility  Bed Mobility Bed Mobility: Not assessed Transfers Transfers: Stand to Sit Sit to Stand: 3: Mod assist;From chair/3-in-1 Stand to Sit: 4: Min assist;With upper extremity assist;To chair/3-in-1 Details for Transfer Assistance: v/c's for hand placement, pt able to initiate but require assist to achieve full upright standing.  Ambulation/Gait Ambulation/Gait Assistance: 4: Min guard Ambulation Distance (Feet): 50 Feet Assistive device: Rolling walker Ambulation/Gait Assistance Details: Cues for gait sequence and posture Gait Pattern: Step-to pattern;Decreased step length - right;Decreased stance time - right;Antalgic    Exercises Total Joint Exercises Quad Sets: AROM;Right;10 reps;Supine Short Arc Quad: AAROM;Right;10 reps Heel Slides:  AAROM;Right;10 reps;Supine Hip ABduction/ADduction: AAROM;Right;10 reps Straight Leg Raises: AAROM;Right;10 reps   PT Diagnosis:    PT Problem List:   PT Treatment Interventions:     PT Goals (current goals can now be found in the care plan section)    Visit Information  Last PT Received On: 01/20/13 Assistance Needed: +1    Subjective Data      Cognition  Cognition Arousal/Alertness: Awake/alert Behavior During Therapy: WFL for tasks assessed/performed Overall Cognitive Status: Within Functional Limits for tasks assessed    Balance     End of Session PT - End of Session Equipment Utilized During Treatment: Gait belt;Right knee immobilizer Activity Tolerance: Patient tolerated treatment well Patient left: in chair;with call bell/phone within reach;with family/visitor present Nurse Communication: Mobility status   GP     Fredrich Birks 01/20/2013, 11:51 AM 01/20/2013 Fredrich Birks PTA [phone] pager [phone] office

## 2013-01-20 NOTE — Progress Notes (Signed)
Physical Therapy Treatment Patient Details Name: YOGI ARTHER MRN: [id number] DOB: 1941-03-02 Today's Date: 01/20/2013 Time: 8295-6213 PT Time Calculation (min): 35 min  PT Assessment / Plan / Recommendation  PT Comments   Patient limited by pain this afternoon. Patient requested to go to bathroom then back to bed. Will increase ambulation tomorrow.   Follow Up Recommendations  Home health PT;Supervision/Assistance - 24 hour     Does the patient have the potential to tolerate intense rehabilitation     Barriers to Discharge        Equipment Recommendations  Rolling walker with 5" wheels;3in1 (PT)    Recommendations for Other Services    Frequency 7X/week   Progress towards PT Goals Progress towards PT goals: Progressing toward goals  Plan Current plan remains appropriate    Precautions / Restrictions Precautions Precautions: Knee Required Braces or Orthoses: Knee Immobilizer - Right Restrictions RLE Weight Bearing: Weight bearing as tolerated   Pertinent Vitals/Pain 10/10 R knee pain. RN provided medication to assist with pain control     Mobility  Bed Mobility Bed Mobility: Sit to Supine Sit to Supine: 4: Min assist Details for Bed Mobility Assistance: Cues for positoning self towards HOB prior to sitting. Cues for technique and A for R LE back into bed Transfers Transfers: Stand to Sit Sit to Stand: 4: Min assist;With upper extremity assist;From chair/3-in-1 Stand to Sit: 4: Min assist;With upper extremity assist;To chair/3-in-1;To bed Details for Transfer Assistance: v/c's for hand placement, pt able to initiate but require assist to achieve full upright standing.  Ambulation/Gait Ambulation/Gait Assistance: 4: Min guard Ambulation Distance (Feet): 30 Feet Assistive device: Rolling walker Ambulation/Gait Assistance Details: Patient with increased pain with gait. LImited this PM Gait Pattern: Step-to pattern;Antalgic Gait velocity: slow    Exercises    PT  Diagnosis:    PT Problem List:   PT Treatment Interventions:     PT Goals (current goals can now be found in the care plan section)    Visit Information  Last PT Received On: 01/20/13 Assistance Needed: +1    Subjective Data      Cognition  Cognition Arousal/Alertness: Awake/alert Behavior During Therapy: WFL for tasks assessed/performed Overall Cognitive Status: Within Functional Limits for tasks assessed    Balance     End of Session PT - End of Session Equipment Utilized During Treatment: Gait belt;Right knee immobilizer Activity Tolerance: Patient limited by fatigue;Patient limited by pain Patient left: with call bell/phone within reach;with family/visitor present;in bed Nurse Communication: Mobility status   GP     Fredrich Birks 01/20/2013, 2:42 PM 01/20/2013 Fredrich Birks PTA [phone] pager [phone] office

## 2013-01-20 NOTE — Progress Notes (Signed)
Patient unable to void adequate amount after removal of foley. He was able to void 50cc one time. Bladder scan revealed >300cc in the bladder so will in and out cath per protocol.   1503-In and out cath performed, 350cc measured output. Will continue to monitor for spontaneous void.

## 2013-01-20 NOTE — Care Management Note (Signed)
    Page 1 of 2   01/20/2013     11:51:28 AM   CARE MANAGEMENT NOTE 01/20/2013  Patient:  AMIRR, ACHORD   Account Number:  [id number]  Date Initiated:  01/19/2013  Documentation initiated by:  [facility]  Subjective/Objective Assessment:   admitted postop rt total knee arthroplasty     Action/Plan:   plan return home with HHPT, HHOT, HHRN   Anticipated DC Date:  01/21/2013   Anticipated DC Plan:  HOME W HOME HEALTH SERVICES      DC Planning Services  CM consult      Choice offered to / List presented to:     DME arranged  [facility]      DME agency  [employer]     HH arranged  HH-1 RN  HH-2 PT  HH-3 OT      [facility] agency  [facility]   Status of service:  Completed, signed off Medicare Important Message given?   (If response is "NO", the following Medicare IM given date fields will be blank) Date Medicare IM given:   Date Additional Medicare IM given:    Discharge Disposition:  HOME W HOME HEALTH SERVICES  Per UR Regulation:    If discussed at Long Length of Stay Meetings, dates discussed:    Comments:  01/20/13 Patient set up with HHPT, HHOT and HHRN with [facility] by MD office. Spoke with patient, no change in d/c plan, wife able to assist patient at home. Jacquelynn Cree RN, BSN, CCM

## 2013-01-21 ENCOUNTER — Encounter (HOSPITAL_COMMUNITY): Payer: Self-pay | Admitting: Orthopedic Surgery

## 2013-01-21 LAB — CBC
Platelets: 197 10*3/uL (ref 150–400)
RBC: 4.06 MIL/uL — ABNORMAL LOW (ref 4.22–5.81)
WBC: 9.1 10*3/uL (ref 4.0–10.5)

## 2013-01-21 NOTE — Discharge Summary (Signed)
Physician Discharge Summary  Patient ID: Taylor Bean MRN: [id number] DOB/AGE: [birth year] 72 y.o.  Admit date: 01/19/2013 Discharge date: 01/21/2013  Admission Diagnoses:  Osteoarthritis of right knee Right hip bursitis Discharge Diagnoses:  Principal Problem:   Osteoarthritis of right knee Right hip bursitis  Past Medical History  Diagnosis Date  . Hyperlipidemia   . Sleep apnea     uses a cpap-will bring dos  . Arthritis     general  . Depression   . Right rotator cuff tear 12/13/2011  . H/O urinary stone   . Fibromyalgia   . Osteoarthritis of right knee 01/19/2013    Surgeries: Procedure(s): TOTAL KNEE ARTHROPLASTY with right trocanteric bursal injection  on 01/19/2013   Consultants (if any):    Discharged Condition: Improved  Hospital Course: Taylor Bean is an 72 y.o. male who was admitted 01/19/2013 with a diagnosis of Osteoarthritis of right knee and went to the operating room on 01/19/2013 and underwent the above named procedures.    He was given perioperative antibiotics:  Anti-infectives   Start     Dose/Rate Route Frequency Ordered Stop   01/19/13 1330  ceFAZolin (ANCEF) IVPB 2 g/50 mL premix     2 g 100 mL/hr over 30 Minutes Intravenous Every 6 hours 01/19/13 1203 01/19/13 2048   01/19/13 0615  ceFAZolin (ANCEF) IVPB 2 g/50 mL premix     2 g 100 mL/hr over 30 Minutes Intravenous On call to O.R. 01/19/13 0606 01/19/13 0731   01/19/13 0611  ceFAZolin (ANCEF) 2-3 GM-% IVPB SOLR    Comments:  TODD, ROBERT: cabinet override      01/19/13 0611 01/19/13 1814    .  He was given sequential compression devices, early ambulation, and lovenox for DVT prophylaxis.  He benefited maximally from the hospital stay and there were no complications.    Recent vital signs:  Filed Vitals:   01/21/13 0603  BP: 146/63  Pulse: 82  Temp: 99.5 F (37.5 C)  Resp: 18    Recent laboratory studies:  Lab Results  Component Value Date   HGB 12.5* 01/21/2013   HGB 13.2 01/20/2013    HGB 13.7 01/19/2013   Lab Results  Component Value Date   WBC 9.1 01/21/2013   PLT 197 01/21/2013   Lab Results  Component Value Date   INR 1.00 01/12/2013   Lab Results  Component Value Date   NA 137 01/20/2013   K 4.2 01/20/2013   CL 99 01/20/2013   CO2 30 01/20/2013   BUN 11 01/20/2013   CREATININE 0.86 01/20/2013   GLUCOSE 139* 01/20/2013    Discharge Medications:     Medication List         aspirin 81 MG tablet  Take 81 mg by mouth daily.     celecoxib 200 MG capsule  Commonly known as:  CELEBREX  Take 200 mg by mouth daily.     enoxaparin 30 MG/0.3ML injection  Commonly known as:  LOVENOX  Inject 0.3 mLs (30 mg total) into the skin every 12 (twelve) hours.     escitalopram 10 MG tablet  Commonly known as:  LEXAPRO  Take 10 mg by mouth daily.     FISH OIL PO  Take 1 capsule by mouth 2 (two) times daily.     oxyCODONE-acetaminophen 10-325 MG per tablet  Commonly known as:  PERCOCET  Take 1-2 tablets by mouth every 6 (six) hours as needed for pain. MAXIMUM TOTAL ACETAMINOPHEN DOSE IS 4000 MG  PER DAY     sennosides-docusate sodium 8.6-50 MG tablet  Commonly known as:  SENOKOT-S  Take 2 tablets by mouth daily.     simvastatin 20 MG tablet  Commonly known as:  ZOCOR  Take 20 mg by mouth at bedtime.        Diagnostic Studies: Dg Knee Right Port  01/19/2013   *RADIOLOGY REPORT*  Clinical Data: Post right total knee arthroplasty  PORTABLE RIGHT KNEE - 1-2 VIEW  Comparison: Portable exam 1048 hours without priors for comparison.  Findings: Components of right knee prosthesis in expected positions. No acute fracture, dislocation or bone destruction. Expected postsurgical changes of regional soft tissues. Bones appear demineralized.  IMPRESSION: Right knee prosthesis without acute complication.   Original Report Authenticated By: Ulyses Southward, M.D.    Disposition: 01-Home or Self Care      Discharge Orders   Future Orders Complete By Expires     Weight bearing as tolerated   As directed        Follow-up Information   Follow up with Eulas Post, MD. Schedule an appointment as soon as possible for a visit in 2 weeks.   Contact information:   [address] [place] [address] [phone]        Signed: Eulas Post 01/21/2013, 9:39 AM

## 2013-01-21 NOTE — Progress Notes (Signed)
Physical Therapy Treatment Patient Details Name: Taylor Bean MRN: [id number] DOB: 05-27-1941 Today's Date: 01/21/2013 Time: 7846-9629 PT Time Calculation (min): 25 min  PT Assessment / Plan / Recommendation  PT Comments   Patient is making good progress with PT. Limited by pain  From a mobility standpoint anticipate patient will be ready for DC home after he completes stair training. Unsure how comfortable the patient feels about going home today .    Follow Up Recommendations  Home health PT;Supervision/Assistance - 24 hour     Does the patient have the potential to tolerate intense rehabilitation     Barriers to Discharge        Equipment Recommendations  Rolling walker with 5" wheels;3in1 (PT)    Recommendations for Other Services    Frequency 7X/week   Progress towards PT Goals Progress towards PT goals: Progressing toward goals  Plan Current plan remains appropriate    Precautions / Restrictions Precautions Precautions: Knee Knee Immobilizer - Right: Discontinue post op day 2 Restrictions RLE Weight Bearing: Weight bearing as tolerated   Pertinent Vitals/Pain 8/10 R knee pain. patient repositioned for comfort     Mobility  Bed Mobility Bed Mobility: Not assessed Transfers Sit to Stand: 4: Min assist;With upper extremity assist;From chair/3-in-1 Stand to Sit: 4: Min assist;With upper extremity assist;To chair/3-in-1 Details for Transfer Assistance: A to ensure balance but overall better than yesterday Ambulation/Gait Ambulation/Gait Assistance: 4: Min guard Ambulation Distance (Feet): 60 Feet Assistive device: Rolling walker Ambulation/Gait Assistance Details: Cues for upright posture and to look forward. Adjusted height on RW.  Gait Pattern: Step-to pattern Gait velocity: decreased    Exercises Total Joint Exercises Quad Sets: AROM;Right;10 reps;Supine Short Arc Quad: AAROM;Right;10 reps Heel Slides: AAROM;Right;10 reps;Supine Straight Leg Raises:  AAROM;Right;10 reps   PT Diagnosis:    PT Problem List:   PT Treatment Interventions:     PT Goals (current goals can now be found in the care plan section)    Visit Information  Last PT Received On: 01/21/13 Assistance Needed: +1 History of Present Illness: R TKA    Subjective Data      Cognition  Cognition Arousal/Alertness: Awake/alert Behavior During Therapy: WFL for tasks assessed/performed Overall Cognitive Status: Within Functional Limits for tasks assessed    Balance     End of Session PT - End of Session Equipment Utilized During Treatment: Gait belt Activity Tolerance: Patient tolerated treatment well;Patient limited by pain Patient left: with call bell/phone within reach;with family/visitor present;in bed Nurse Communication: Mobility status   GP     Fredrich Birks 01/21/2013, 8:55 AM 01/21/2013 Fredrich Birks PTA [phone] pager [phone] office

## 2013-01-21 NOTE — Progress Notes (Signed)
Patient ID: BOBAK OGUINN, male   DOB: [birth year], 72 y.o.   MRN: [id number]     Subjective:  Patient reports pain as mild to moderate.  Patient state that he is some better but still having lots of pain.  Objective:   VITALS:   Filed Vitals:   01/20/13 1329 01/20/13 2019 01/20/13 2301 01/21/13 0603  BP: 130/61 137/66  146/63  Pulse: 70 93  82  Temp: 100.9 F (38.3 C) 99.6 F (37.6 C) 99.3 F (37.4 C) 99.5 F (37.5 C)  TempSrc:  Oral Oral Oral  Resp: 18 18  18   SpO2: 96% 93%  94%    ABD soft Sensation intact distally Dorsiflexion/Plantar flexion intact Incision: dressing C/D/I and no drainage Wound clean and dry no infection   Lab Results  Component Value Date   WBC 9.1 01/21/2013   HGB 12.5* 01/21/2013   HCT 37.1* 01/21/2013   MCV 91.4 01/21/2013   PLT 197 01/21/2013     Assessment/Plan: 2 Days Post-Op   Principal Problem:   Osteoarthritis of right knee   Advance diet Up with therapy Discharge home with home health if passes PT   Haskel Khan 01/21/2013, 7:23 AM   Teryl Lucy, MD Cell [phone]

## 2013-01-21 NOTE — Progress Notes (Signed)
Occupational Therapy Evaluation Patient Details Name: Taylor Bean MRN: [id number] DOB: [birth year] Today's Date: 01/21/2013 Time: 4098-1191 OT Time Calculation (min): 35 min  OT Assessment / Plan / Recommendation History of present illness R TKA   Clinical Impression   Completed all education regarding DME and AE for ADL. Pt will have adequate assistance for D/C. Pt requesting to stay until tomorrow. No further OT needs. OT signing off.    OT Assessment  Patient does not need any further OT services    Follow Up Recommendations  No OT follow up    Barriers to Discharge      Equipment Recommendations  3 in 1 bedside comode    Recommendations for Other Services    Frequency    eval only   Precautions / Restrictions Precautions Precautions: Knee Precaution Booklet Issued: Yes (comment) Required Braces or Orthoses: Knee Immobilizer - Right Knee Immobilizer - Right: Discontinue post op day 2 Restrictions Weight Bearing Restrictions: Yes RLE Weight Bearing: Weight bearing as tolerated   Pertinent Vitals/Pain 4. Knee. Repositioned. Ice    ADL  Lower Body Bathing: Minimal assistance Where Assessed - Lower Body Bathing: Unsupported sit to stand Lower Body Dressing: Minimal assistance Where Assessed - Lower Body Dressing: Unsupported sit to stand Toilet Transfer: Supervision/safety Tub/Shower Transfer: Supervision/safety Tub/Shower Transfer Method: Science writer: Walk in shower;Other (comment) (3 in 1) Transfers/Ambulation Related to ADLs: S RW lwvel ADL Comments: Educated pt on use of DME and AE for ADL    OT Diagnosis:    OT Problem List:   OT Treatment Interventions:     OT Goals(Current goals can be found in the care plan section) Acute Rehab OT Goals Patient Stated Goal: home OT Goal Formulation:  (eval only)  Visit Information  Last OT Received On: 01/21/13 Assistance Needed: +1 History of Present Illness: R TKA       Prior  Functioning     Home Living Family/patient expects to be discharged to:: Private residence Living Arrangements: Spouse/significant other Available Help at Discharge: Family;Available 24 hours/day Type of Home: House Home Access: Stairs to enter [employer] of Steps: 1 Home Layout: Multi-level Alternate Level Stairs-Number of Steps: 7 Alternate Level Stairs-Rails: Left Home Equipment: None Additional Comments: pt can stay on 1 floor, would sleep in a recliner but would need BSC Prior Function Level of Independence: Independent Communication Communication: No difficulties Dominant Hand: Right         Vision/Perception     Cognition  Cognition Arousal/Alertness: Awake/alert Behavior During Therapy: WFL for tasks assessed/performed Overall Cognitive Status: Within Functional Limits for tasks assessed    Extremity/Trunk Assessment Upper Extremity Assessment Upper Extremity Assessment: Overall WFL for tasks assessed Lower Extremity Assessment RLE Deficits / Details: able to initiate quad set, tolerate 10 deg of AAROM to knee Cervical / Trunk Assessment Cervical / Trunk Assessment: Normal     Mobility Bed Mobility Bed Mobility: Not assessed Transfers Transfers: Sit to Stand;Stand to Sit Sit to Stand: With upper extremity assist;From chair/3-in-1;5: Supervision Sit to Stand: Patient Percentage: 60% Stand to Sit: With upper extremity assist;To chair/3-in-1;5: Supervision Details for Transfer Assistance: MinGuard for safety. Patient with good technique     Exercise     Balance  mod I   End of Session OT - End of Session Equipment Utilized During Treatment: Rolling walker Activity Tolerance: Patient tolerated treatment well Patient left: in chair;with call bell/phone within reach;with family/visitor present Nurse Communication: Mobility status  GO     Taylor Bean,HILLARY  01/21/2013, 4:05 PM [facility], OTR/L  [phone] 01/21/2013

## 2013-01-21 NOTE — Progress Notes (Signed)
Physical Therapy Treatment Patient Details Name: Taylor Bean MRN: [id number] DOB: 01-01-1941 Today's Date: 01/21/2013 Time: 4010-2725 PT Time Calculation (min): 24 min  PT Assessment / Plan / Recommendation  PT Comments   Patient able to practice steps. Patient and wife to make decisions on if they feel comfortable to go home later today. Everything has been taught to patient and he states that regarding mobility he feels safe to DC home. Wants to wait and check vitals again with nurse Ivar Drape prior to determining if he wants to DC home.   Follow Up Recommendations  Home health PT;Supervision/Assistance - 24 hour     Does the patient have the potential to tolerate intense rehabilitation     Barriers to Discharge        Equipment Recommendations  Rolling walker with 5" wheels;3in1 (PT)    Recommendations for Other Services    Frequency 7X/week   Progress towards PT Goals Progress towards PT goals: Progressing toward goals  Plan Current plan remains appropriate    Precautions / Restrictions Precautions Precautions: Knee Knee Immobilizer - Right: Discontinue post op day 2 Restrictions RLE Weight Bearing: Weight bearing as tolerated   Pertinent Vitals/Pain 7/10 knee pain. RN provided medication to assist with pain control     Mobility  Bed Mobility Bed Mobility: Not assessed Transfers Sit to Stand: 4: Min guard;With upper extremity assist;From chair/3-in-1 Stand to Sit: 4: Min guard;With upper extremity assist;To chair/3-in-1 Details for Transfer Assistance: MinGuard for safety. Patient with good technique Ambulation/Gait Ambulation/Gait Assistance: 4: Min guard Ambulation Distance (Feet): 80 Feet Assistive device: Rolling walker Ambulation/Gait Assistance Details: Cues to look forward Gait Pattern: Step-to pattern Gait velocity: decreased Stairs: Yes Stairs Assistance: 4: Min guard Stairs Assistance Details (indicate cue type and reason): Cues for technique and  sequency.  Stair Management Technique: Step to pattern;Forwards;One rail Left;With cane Number of Stairs: 5    Exercises     PT Diagnosis:    PT Problem List:   PT Treatment Interventions:     PT Goals (current goals can now be found in the care plan section)    Visit Information  Last PT Received On: 01/21/13 Assistance Needed: +1 History of Present Illness: R TKA    Subjective Data      Cognition  Cognition Arousal/Alertness: Awake/alert Behavior During Therapy: WFL for tasks assessed/performed Overall Cognitive Status: Within Functional Limits for tasks assessed    Balance     End of Session PT - End of Session Equipment Utilized During Treatment: Gait belt Activity Tolerance: Patient tolerated treatment well Patient left: in chair;with call bell/phone within reach Nurse Communication: Mobility status   GP     Fredrich Birks 01/21/2013, 2:03 PM 01/21/2013 Fredrich Birks PTA [phone] pager [phone] office

## 2013-02-09 ENCOUNTER — Ambulatory Visit (HOSPITAL_COMMUNITY): Payer: Self-pay | Admitting: Physical Therapy

## 2013-02-10 ENCOUNTER — Ambulatory Visit (HOSPITAL_COMMUNITY)
Admission: RE | Admit: 2013-02-10 | Discharge: 2013-02-10 | Disposition: A | Discharge status: Home / Self Care | Payer: Medicare Other | Source: Ambulatory Visit | Attending: Orthopedic Surgery | Admitting: Orthopedic Surgery

## 2013-02-10 DIAGNOSIS — M25662 Stiffness of left knee, not elsewhere classified: Secondary | ICD-10-CM | POA: Insufficient documentation

## 2013-02-10 DIAGNOSIS — R262 Difficulty in walking, not elsewhere classified: Secondary | ICD-10-CM | POA: Insufficient documentation

## 2013-02-10 DIAGNOSIS — IMO0001 Reserved for inherently not codable concepts without codable children: Secondary | ICD-10-CM | POA: Insufficient documentation

## 2013-02-10 DIAGNOSIS — M25569 Pain in unspecified knee: Secondary | ICD-10-CM | POA: Insufficient documentation

## 2013-02-10 DIAGNOSIS — M25669 Stiffness of unspecified knee, not elsewhere classified: Secondary | ICD-10-CM | POA: Insufficient documentation

## 2013-02-10 DIAGNOSIS — M6281 Muscle weakness (generalized): Secondary | ICD-10-CM | POA: Insufficient documentation

## 2013-02-10 NOTE — Evaluation (Signed)
Physical Therapy Evaluation  Patient Details  Name: Taylor Bean MRN: [id number] Date of Birth: 01/21/1941  Today's Date: 02/10/2013 Time: 4098-1191 PT Time Calculation (min): 39 min Charge:  eval             Visit#: 1 of 12  Re-eval: 03/12/13 Assessment Diagnosis: Rt TKR Surgical Date: 01/19/13 Next MD Visit: 02/21/2013 Prior Therapy: HH  Authorization: medicare blue    Past Medical History:  Past Medical History  Diagnosis Date  . Hyperlipidemia   . Sleep apnea     uses a cpap-will bring dos  . Arthritis     general  . Depression   . Right rotator cuff tear 12/13/2011  . H/O urinary stone   . Fibromyalgia   . Osteoarthritis of right knee 01/19/2013   Past Surgical History:  Past Surgical History  Procedure Laterality Date  . 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 Joint replacement  2009    rt total hip  . Tonsillectomy    . Leg surgery      fx rt leg as 72 yr old  . Colonoscopy w/ polypectomy      begin  . Rotator cuff repair Right 4/31/13  . Total knee arthroplasty Right 01/19/2013    Procedure: TOTAL KNEE ARTHROPLASTY with right trocanteric bursal injection ;  Surgeon: Eulas Post, MD;  Location: [facility] OR;  Service: Orthopedics;  Laterality: Right;    Subjective Symptoms/Limitations Symptoms: Mr. Gaus states that he had a TKR on 01/19/2013 and was discharge on 7/11.  The patient recieved HH until 02/06/2013.  The pt had his steristrips taken off two days ago.  The patient states that his having the most difficulty lying in bed and sleeping.  He is walking with a cane and would like to get back to no assistive device.  He is having to sit for his shower and he is having difficulty getting in and out of a car.   How long can you sit comfortably?: able to sit 20-30 minutes How long can you stand comfortably?: able to stand for less than five minutes How long can you walk comfortably?: The pt can walk for 15 minutes Pain Assessment Currently in Pain?: No/denies (worst  pain past week has been a 5/10) Pain Score: 0-No pain Pain Location: Knee Pain Orientation: Right Pain Type: Acute pain     Balance Screening  no falls  Prior Functioning  Prior Function Vocation: Part time employment Vocation Requirements: [employer] Leisure: Hobbies-yes (Comment) Comments: yard work  Cognition/Observation Cognition Overall Cognitive Status: Within Functional Limits for tasks assessed  Sensation/Coordination/Flexibility/Functional Tests  LEFS 8/80  Assessment RLE AROM (degrees) Right Knee Extension: -23 Right Knee Flexion: 100 RLE Strength Right Hip Flexion: 5/5 Right Hip Extension: 3+/5 Right Hip ABduction: 5/5 Right Knee Flexion: 4/5 Right Knee Extension: 5/5 Right Ankle Dorsiflexion: 4/5  Exercise/Treatments    Stretches Active Hamstring Stretch: 3 reps;30 seconds   Supine Quad Sets: 10 reps Heel Slides: 10 reps Bridges: 10 reps Knee Extension: PROM Knee Flexion: PROM  Physical Therapy Assessment and Plan PT Assessment and Plan Clinical Impression Statement: Pt s/p TKR three weeks ago.  Pt demonstrates decreased ROM, decreased strength, and decreased balance.  Pt will benefit from skilled PT to return pt to prior functional level by addressing the former issues. Pt will benefit from skilled therapeutic intervention in order to improve on the following deficits: Decreased activity tolerance;Decreased balance;Decreased strength;Difficulty walking;Pain;Increased edema;Decreased range of motion Rehab Potential: Good  PT Frequency: Min 3X/week PT Duration: 4 weeks PT Treatment/Interventions: Gait training;Stair training;Therapeutic exercise;Modalities;Manual techniques PT Plan: Pt treatment to emphasis extension and balance.  Begin rockerboard, bike, terminal ext standing and supine as well as manual while completing prone knee hang.    Goals Home Exercise Program Pt/caregiver will Perform Home Exercise Program: For increased ROM PT Goal: Perform  Home Exercise Program - Progress: Goal set today PT Short Term Goals Time to Complete Short Term Goals: 2 weeks PT Short Term Goal 1: ROM to be 10-110 to allow more normalized gt and allow pt to sit with comfort for 40 minutes to enjoy a meal PT Short Term Goal 2: Pt to be able to stand for 15 minutes to take a shower without usint the shower chair PT Short Term Goal 3: Pt to be able to walk inside with no assistive device PT Short Term Goal 4: Pt to be able to walk with the cane for an hour outside PT Short Term Goal 5: Pt able to get in and out of the car with ease PT Long Term Goals Time to Complete Long Term Goals: 4 weeks PT Long Term Goal 1: I in advance HEP PT Long Term Goal 2: PT to be walking inside and out without a cane Long Term Goal 3: Rom to 5-120 to allow normalized gt and allow pt to sit for over an hour to enjoy a meal on a cruise ship Long Term Goal 4: Pt pain to be no greater than a 3/10 80% of the day PT Long Term Goal 5: Pt to be sleeping in his bed again with comfort Additional PT Long Term Goals?:  (LEFS to be increased by at least 20 pts)  Problem List Patient Active Problem List   Diagnosis Date Noted  . Stiffness of left knee 02/10/2013  . Pain in joint, lower leg 02/10/2013  . Difficulty in walking(719.7) 02/10/2013  . Osteoarthritis of right knee 01/19/2013  . Muscle weakness (generalized) 01/28/2012  . Pain in joint, shoulder region 01/28/2012  . Right rotator cuff tear 12/13/2011    PT - End of Session Activity Tolerance: Patient tolerated treatment well General Behavior During Therapy: WFL for tasks assessed/performed PT Plan of Care PT Home Exercise Plan: given  GP Functional Assessment Tool Used: LEFS Functional Limitation: Self care Self Care Current Status (B1478): At least 80 percent but less than 100 percent impaired, limited or restricted Self Care Goal Status (G9562): At least 20 percent but less than 40 percent impaired, limited or  restricted  [name],[name] 02/10/2013, 4:41 PM  Physician Documentation Your signature is required to indicate approval of the treatment plan as stated above.  Please sign and either send electronically or make a copy of this report for your files and return this physician signed original.   Please mark one 1.__approve of plan  2. ___approve of plan with the following conditions.   ______________________________                                                          _____________________ Physician Signature  Date  

## 2013-02-12 ENCOUNTER — Ambulatory Visit (HOSPITAL_COMMUNITY)
Admission: RE | Admit: 2013-02-12 | Discharge: 2013-02-12 | Disposition: A | Discharge status: Home / Self Care | Payer: Medicare Other | Source: Ambulatory Visit | Attending: Family Medicine | Admitting: Family Medicine

## 2013-02-12 DIAGNOSIS — IMO0001 Reserved for inherently not codable concepts without codable children: Secondary | ICD-10-CM | POA: Insufficient documentation

## 2013-02-12 DIAGNOSIS — M25662 Stiffness of left knee, not elsewhere classified: Secondary | ICD-10-CM

## 2013-02-12 DIAGNOSIS — R262 Difficulty in walking, not elsewhere classified: Secondary | ICD-10-CM

## 2013-02-12 DIAGNOSIS — M6281 Muscle weakness (generalized): Secondary | ICD-10-CM | POA: Insufficient documentation

## 2013-02-12 DIAGNOSIS — M25569 Pain in unspecified knee: Secondary | ICD-10-CM | POA: Insufficient documentation

## 2013-02-12 DIAGNOSIS — M25669 Stiffness of unspecified knee, not elsewhere classified: Secondary | ICD-10-CM | POA: Insufficient documentation

## 2013-02-12 NOTE — Progress Notes (Signed)
Physical Therapy Treatment Patient Details  Name: Taylor Bean MRN: [id number] Date of Birth: 12-Oct-1940  Today's Date: 02/12/2013 Time: 1300-1345 PT Time Calculation (min): 45 min Charge:  There ex 3 (1300-1340) manual [place] 1240-1345 Visit#: 2 of 12  Re-eval: 03/12/13    Authorization: medicare blue  Subjective: Symptoms/Limitations Symptoms: Pt has been doing his exercises and has no complaints. Pain Assessment Currently in Pain?: No/denies  Precautions/Restrictions     Exercise/Treatments     Stretches Active Hamstring Stretch: 3 reps;30 seconds Passive Hamstring Stretch: 1 rep;60 seconds;Limitations Passive Hamstring Stretch Limitations: long sit on mat   Standing Heel Raises: 10 reps Knee Flexion: 10 reps Terminal Knee Extension: 10 reps SLS: 3x 10"   Supine Quad Sets: 10 reps Heel Slides: 10 reps Terminal Knee Extension: 10 reps Bridges: 10 reps Knee Extension: PROM Knee Flexion: PROM   Prone  Other Prone Exercises: Terminal knee extension x 10   Manual Therapy Manual Therapy: Joint mobilization Joint Mobilization: Patellar mob/soft tissue work to hamstrings while prone  Physical Therapy Assessment and Plan PT Assessment and Plan Clinical Impression Statement: Pt ROM is improving.  Added new exercises without difficulty. PT Plan: begin rockerboard and quadricep stretch next visit.    Goals  progressing  Problem List Patient Active Problem List   Diagnosis Date Noted  . Stiffness of left knee 02/10/2013  . Pain in joint, lower leg 02/10/2013  . Difficulty in walking(719.7) 02/10/2013  . Osteoarthritis of right knee 01/19/2013  . Muscle weakness (generalized) 01/28/2012  . Pain in joint, shoulder region 01/28/2012  . Right rotator cuff tear 12/13/2011    PT - End of Session Activity Tolerance: Patient tolerated treatment well General Behavior During Therapy: WFL for tasks assessed/performed  GP    [name],[name] 02/12/2013, 1:56 PM

## 2013-02-15 ENCOUNTER — Ambulatory Visit (HOSPITAL_COMMUNITY)
Admission: RE | Admit: 2013-02-15 | Discharge: 2013-02-15 | Disposition: A | Discharge status: Home / Self Care | Payer: Medicare Other | Source: Ambulatory Visit | Attending: Family Medicine | Admitting: Family Medicine

## 2013-02-15 NOTE — Progress Notes (Signed)
Physical Therapy Treatment Patient Details  Name: Taylor Bean MRN: [id number] Date of Birth: December 18, 1940  Today's Date: 02/15/2013 Time: 1304-1400 PT Time Calculation (min): 56 min  Visit#: 3 of 12  Re-eval: 03/12/13 Charges: Therex x 32' (1304-1336) Manual x [phone]) Ice x 10' (1350-1400)   Authorization: medicare blue  Authorization Visit#: 3 of 10    Subjective: Symptoms/Limitations Symptoms: Pt states that he has most pain at night. Pain Assessment Currently in Pain?: Yes Pain Score: 1  Pain Location: Knee Pain Orientation: Right   Exercise/Treatments Standing Heel Raises: 10 reps;Limitations Heel Raises Limitations: Toes raises x 10 Knee Flexion: 10 reps Terminal Knee Extension: 10 reps Functional Squat: 10 reps Rocker Board: 2 minutes;Limitations Rocker Board Limitations: R/L SLS: 3x 10" Supine Quad Sets: 10 reps Short Arc Quad Sets: 10 reps Knee Extension: PROM Knee Flexion: PROM   Modalities Modalities: Cryotherapy Manual Therapy Manual Therapy: Myofascial release Joint Mobilization: A/P grade II-III to right knee   Myofascial Release: To right anterior knee to decrease adhesions  Cryotherapy Number Minutes Cryotherapy: 10 Minutes Cryotherapy Location: Knee (Right) Type of Cryotherapy: Ice pack  Physical Therapy Assessment and Plan PT Assessment and Plan Clinical Impression Statement: Progressed standing exercises with minimal difficulty after initial cueing and demo. Right knee AROM has improved to 1120 degrees of flexion and 18 degrees of extension. MFR completed to right knee to decrease adhesions and improve ROM. Ice applied at end of session to limit pain and inflammation. PT Plan: Continue to progress ROM and strength per PT POC.     Problem List Patient Active Problem List   Diagnosis Date Noted  . Stiffness of left knee 02/10/2013  . Pain in joint, lower leg 02/10/2013  . Difficulty in walking(719.7) 02/10/2013  . Osteoarthritis of  right knee 01/19/2013  . Muscle weakness (generalized) 01/28/2012  . Pain in joint, shoulder region 01/28/2012  . Right rotator cuff tear 12/13/2011    PT - End of Session Activity Tolerance: Patient tolerated treatment well General Behavior During Therapy: [facility] for tasks assessed/performed  Seth Bake, PTA 02/15/2013, 2:34 PM

## 2013-02-17 ENCOUNTER — Ambulatory Visit (HOSPITAL_COMMUNITY)
Admission: RE | Admit: 2013-02-17 | Discharge: 2013-02-17 | Disposition: A | Discharge status: Home / Self Care | Payer: Medicare Other | Source: Ambulatory Visit | Attending: Family Medicine | Admitting: Family Medicine

## 2013-02-17 ENCOUNTER — Ambulatory Visit (HOSPITAL_COMMUNITY): Payer: Self-pay | Admitting: *Deleted

## 2013-02-17 NOTE — Progress Notes (Signed)
Physical Therapy Treatment Patient Details  Name: Taylor Bean MRN: [id number] Date of Birth: 07-Dec-1940  Today's Date: 02/17/2013 Time: 0930-1028 PT Time Calculation (min): 58 min  Visit#: 4 of 12  Re-eval: 03/12/13 Charges: Therex x [phone]) Manual x 16'(1000-1016) Ice x 10'(1018-1028)  Authorization: medicare blue  Authorization Visit#: 4 of 10   Subjective: Symptoms/Limitations Symptoms: Pt states that his knee is stiff and achey today. Pain Assessment Currently in Pain?: Yes Pain Score: 4  Pain Location: Knee Pain Orientation: Right   Exercise/Treatments Stretches Gastroc Stretch: 2 reps;30 seconds;Limitations Gastroc Stretch Limitations: Slant board Aerobic Stationary Bike: 5' seat 12 to improve ROM Standing Heel Raises: 10 reps;Limitations Heel Raises Limitations: Toes raises x 10 Knee Flexion: 10 reps Functional Squat: 5 sets Rocker Board: 2 minutes;Limitations Rocker Board Limitations: R/L SLS: 5x5" Supine Quad Sets: 10 reps Short Arc Quad Sets: 10 reps Knee Extension: PROM Knee Flexion: PROM Prone  Prone Knee Hang: Limitations Prone Knee Hang Limitations: 8' with MFR wot hamstrings   Modalities Modalities: Cryotherapy Manual Therapy Manual Therapy: Myofascial release Myofascial Release: Supine:to right anterior knee to decrease adhesions; Prone: with knee hang  Cryotherapy Number Minutes Cryotherapy: 10 Minutes Cryotherapy Location: Knee (Right) Type of Cryotherapy: Ice pack  Physical Therapy Assessment and Plan PT Assessment and Plan Clinical Impression Statement: Pt is progressing well. Right knee AROM is 12-112. Manual techniques completed to right knee to decrease tightness and improve ROM. Pt able to tolerate prone knee hang with MFR for 8'. Ice applied at end of session to limit pain and inflammation. Pt reports pain decrease to 0/10 at end of session.  PT Plan: Continue to progress ROM and strength per PT POC.     Problem  List Patient Active Problem List   Diagnosis Date Noted  . Stiffness of left knee 02/10/2013  . Pain in joint, lower leg 02/10/2013  . Difficulty in walking(719.7) 02/10/2013  . Osteoarthritis of right knee 01/19/2013  . Muscle weakness (generalized) 01/28/2012  . Pain in joint, shoulder region 01/28/2012  . Right rotator cuff tear 12/13/2011    PT - End of Session Activity Tolerance: Patient tolerated treatment well General Behavior During Therapy: [facility] for tasks assessed/performed   Seth Bake, PTA  02/17/2013, 10:34 AM

## 2013-02-19 ENCOUNTER — Ambulatory Visit (HOSPITAL_COMMUNITY)
Admission: RE | Admit: 2013-02-19 | Discharge: 2013-02-19 | Disposition: A | Discharge status: Home / Self Care | Payer: Medicare Other | Source: Ambulatory Visit | Attending: Family Medicine | Admitting: Family Medicine

## 2013-02-19 DIAGNOSIS — R262 Difficulty in walking, not elsewhere classified: Secondary | ICD-10-CM

## 2013-02-19 DIAGNOSIS — M25662 Stiffness of left knee, not elsewhere classified: Secondary | ICD-10-CM

## 2013-02-19 DIAGNOSIS — M25561 Pain in right knee: Secondary | ICD-10-CM

## 2013-02-19 NOTE — Progress Notes (Signed)
Physical Therapy Treatment Patient Details  Name: Taylor Bean MRN: [id number] Date of Birth: 16-Sep-1940  Today's Date: 02/19/2013 Time: 7829-5621 PT Time Calculation (min): 57 min Charge: MMT/ROM x 1, TE 1025-1052  Manual 1054-1106, Ice 1105-1115  Visit#: 5 of 12  Re-eval: 03/12/13 Assessment Diagnosis: Rt TKR Surgical Date: 01/19/13 Next MD Visit: Dr. Dion Saucier - 02/21/13 Prior Therapy: HH  Authorization: medicare blue  Authorization Time Period:    Authorization Visit#: 5 of 10   Subjective: Symptoms/Limitations Symptoms: Pt reports continues to have some stiffness to his knee.  Improved walking around the house and most areas without aide of a cane, up and down steps without a cane.  How long can you sit comfortably?: 20 minutes (was 20-30 minutes) How long can you stand comfortably?: 5-10 minutes (was 5 minutes) How long can you walk comfortably?: 15 minutes independently ( was 15 minutes with SPC) Pain Assessment Pain Score: 2  Pain Location: Knee Pain Orientation: Right  Objective:  Exercise/Treatments Stretches Active Hamstring Stretch: 3 reps;30 seconds Quad Stretch: 3 reps;30 seconds Gastroc Stretch: 3 reps;30 seconds Aerobic Stationary Bike: 6' seat 12 to improve ROM Supine Quad Sets: 10 reps Knee Extension: PROM Knee Flexion: PROM Prone  Prone Knee Hang: Limitations Prone Knee Hang Limitations: 8' with MFR to hamstrings   Modalities Modalities: Cryotherapy Manual Therapy Manual Therapy: Myofascial release Myofascial Release: 1054-1106 MFR to right anterior knee to decreased adhesions, MFR/STM to hamstrings during prone knee hang 1030-1038 Cryotherapy Number Minutes Cryotherapy: 10 Minutes Cryotherapy Location: Knee Type of Cryotherapy: Ice pack  Physical Therapy Assessment and Plan PT Assessment and Plan Clinical Impression Statement: Re-eval complete: Taylor Bean has had 5 OPPT sessions over 1 1/2 weeks with the following findings:  Pt independent with  HEP and able to demonstrate appropriate technique with all exercises.  Improved AROM 10-105, PROM 5-110.  Pt with increased tolerances with sitting, standing and walking.  Pt ambulates indoors with no AD and used SPC outdoors.  Pt continues to have difficulty getting in and out of car and difficulty sleeping at night due to discomfort of knee.  Pt stated pain average 2/10 daily.  Pt with improved perceived LE functional scale  PT Plan: F/u with MD.  Recommend continuing OPPT to progress ROM and strength per PT POC.    Goals Home Exercise Program Pt/caregiver will Perform Home Exercise Program: For increased ROM PT Goal: Perform Home Exercise Program - Progress: Met PT Short Term Goals Time to Complete Short Term Goals: 2 weeks PT Short Term Goal 1: ROM to be 10-110 to allow more normalized gt and allow pt to sit with comfort for 40 minutes to enjoy a meal PT Short Term Goal 1 - Progress: Progressing toward goal (PROM 5-110, AROM 10-105, sit for 20 min confortably) PT Short Term Goal 2: Pt to be able to stand for 15 minutes to take a shower without usint the shower chair PT Short Term Goal 2 - Progress: Progressing toward goal (5-10 minutes) PT Short Term Goal 3: Pt to be able to walk inside with no assistive device PT Short Term Goal 3 - Progress: Met PT Short Term Goal 4: Pt to be able to walk with the cane for an hour outside PT Short Term Goal 4 - Progress: Progressing toward goal (ambulate with SPC outdoors 15-20 minutes) PT Short Term Goal 5: Pt able to get in and out of the car with ease PT Short Term Goal 5 - Progress: Progressing toward goal PT Long Term  Goals Time to Complete Long Term Goals: 4 weeks PT Long Term Goal 1: I in advance HEP PT Long Term Goal 1 - Progress: Progressing toward goal PT Long Term Goal 2: PT to be walking inside and out without a cane PT Long Term Goal 2 - Progress: Progressing toward goal (ambulated outdoors with [facility]) Long Term Goal 3: Rom to 5-120 to allow  normalized gt and allow pt to sit for over an hour to enjoy a meal on a cruise ship Long Term Goal 3 Progress: Progressing toward goal Long Term Goal 4: Pt pain to be no greater than a 3/10 80% of the day Long Term Goal 4 Progress: Met PT Long Term Goal 5: Pt to be sleeping in his bed again with comfort Long Term Goal 5 Progress: Not met Additional PT Long Term Goals?: Yes (LEFS to be increased by at least 20 pts) PT Long Term Goal 6: LEFS to be increased by at least 20 pts Long Term Goal 6 Progress: Met (30/80)  Problem List Patient Active Problem List   Diagnosis Date Noted  . Stiffness of left knee 02/10/2013  . Pain in joint, lower leg 02/10/2013  . Difficulty in walking(719.7) 02/10/2013  . Osteoarthritis of right knee 01/19/2013  . Muscle weakness (generalized) 01/28/2012  . Pain in joint, shoulder region 01/28/2012  . Right rotator cuff tear 12/13/2011    PT - End of Session Activity Tolerance: Patient tolerated treatment well General Behavior During Therapy: [facility] for tasks assessed/performed  GP Functional Assessment Tool Used: LEFS Functional Limitation: Self care Self Care Current Status (B1478): At least 60 percent but less than 80 percent impaired, limited or restricted Self Care Goal Status (G9562): At least 20 percent but less than 40 percent impaired, limited or restricted  Juel Burrow 02/19/2013, 1:08 PM

## 2013-02-22 ENCOUNTER — Ambulatory Visit (HOSPITAL_COMMUNITY)
Admission: RE | Admit: 2013-02-22 | Discharge: 2013-02-22 | Disposition: A | Discharge status: Home / Self Care | Payer: Medicare Other | Source: Ambulatory Visit | Attending: Family Medicine | Admitting: Family Medicine

## 2013-02-22 DIAGNOSIS — M25662 Stiffness of left knee, not elsewhere classified: Secondary | ICD-10-CM

## 2013-02-22 DIAGNOSIS — R262 Difficulty in walking, not elsewhere classified: Secondary | ICD-10-CM

## 2013-02-22 NOTE — Progress Notes (Signed)
Physical Therapy Treatment Patient Details  Name: Taylor Bean MRN: [id number] Date of Birth: 10-04-1940  Today's Date: 02/22/2013 Time: 1430-1530 PT Time Calculation (min): 60 min Charge;  There ex x 25 (0865-7846); manual 1559-1520;ip 1520-1530 Visit#: 6 of 12  Re-eval: 03/12/13    Authorization: medicare blue  Authorization Time Period:    Authorization Visit#: 6 of 10   Subjective: Symptoms/Limitations Symptoms: Pt continues to complain of stiffness.   Pain Assessment Currently in Pain?: Yes Pain Score: 2  Pain Location: Knee Pain Orientation: Right  Exercise/Treatments   Stretches Gastroc Stretch: 3 reps;30 seconds;Limitations Gastroc Stretch Limitations: slant board Aerobic Stationary Bike: 8' seat at 10     Standing Terminal Knee Extension: 10 reps Other Standing Knee Exercises: worked on mechanics of heel raise to heel strike in gait.    Supine Heel Slides: 10 reps Terminal Knee Extension: 10 reps Patellar Mobs:  (all directions) Knee Extension: PROM Knee Flexion: PROM    Prone  Prone Knee Hang: Limitations Prone Knee Hang Limitations: 8' with tissue massage. Other Prone Exercises: terminal flextion/extesion.    Modalities Modalities: Cryotherapy Manual Therapy Manual Therapy: Edema management Edema Management: retro massage to decrease edema Joint Mobilization: Grade II-III tibialfemoral mobs, fibular-femoral mobs whils in terminal flextion. Cryotherapy Number Minutes Cryotherapy: 10 Minutes Cryotherapy Location: Knee Type of Cryotherapy: Ice pack  Physical Therapy Assessment and Plan PT Assessment and Plan Clinical Impression Statement: Pt ROM 18 to 108.  Sent MD request for JAS brace for extension.   Pt will benefit from skilled therapeutic intervention in order to improve on the following deficits: Decreased activity tolerance;Decreased balance;Decreased strength;Difficulty walking;Pain;Increased edema;Decreased range of motion Rehab  Potential: Good PT Frequency: Min 3X/week PT Duration: 4 weeks PT Plan: Continue focusing on ROM not strengthening.  Pt to continue active and long sit passive hamstring stretches    Goals    Problem List Patient Active Problem List   Diagnosis Date Noted  . Stiffness of left knee 02/10/2013  . Pain in joint, lower leg 02/10/2013  . Difficulty in walking(719.7) 02/10/2013  . Osteoarthritis of right knee 01/19/2013  . Muscle weakness (generalized) 01/28/2012  . Pain in joint, shoulder region 01/28/2012  . Right rotator cuff tear 12/13/2011    PT - End of Session Activity Tolerance: Patient tolerated treatment well General Behavior During Therapy: [facility] for tasks assessed/performed  GP    [name],[name] 02/22/2013, 3:50 PM

## 2013-02-24 ENCOUNTER — Ambulatory Visit (HOSPITAL_COMMUNITY)
Admission: RE | Admit: 2013-02-24 | Discharge: 2013-02-24 | Disposition: A | Discharge status: Home / Self Care | Payer: Medicare Other | Source: Ambulatory Visit | Attending: Family Medicine | Admitting: Family Medicine

## 2013-02-24 ENCOUNTER — Telehealth (HOSPITAL_COMMUNITY): Payer: Self-pay

## 2013-02-24 DIAGNOSIS — M25662 Stiffness of left knee, not elsewhere classified: Secondary | ICD-10-CM

## 2013-02-24 DIAGNOSIS — R262 Difficulty in walking, not elsewhere classified: Secondary | ICD-10-CM

## 2013-02-24 NOTE — Progress Notes (Signed)
Physical Therapy Treatment Patient Details  Name: Taylor Bean MRN: [id number] Date of Birth: 08/04/1940  Today's Date: 02/24/2013 Time: 4098-1191 PT Time Calculation (min): 71 min Charge: TE 1514-1600, Manual 1600-1615, Ice 4782-9562  Visit#: 7 of 12  Re-eval: 03/12/13 Assessment Diagnosis: Rt TKR Surgical Date: 01/19/13 Next MD Visit: Dr. Dion Saucier - 03/22/2013? Prior Therapy: HH  Authorization: medicare blue  Authorization Time Period:    Authorization Visit#: 7 of 10   Subjective: Symptoms/Limitations Symptoms: Pt reported no pain today, continues to complian of stiffness Pain Assessment Currently in Pain?: No/denies  Objective:  Exercise/Treatments Stretches Passive Hamstring Stretch: 2 reps;60 seconds Passive Hamstring Stretch Limitations: long sit on mat Quad Stretch: 3 reps;30 seconds Gastroc Stretch: 3 reps;30 seconds;Limitations Gastroc Stretch Limitations: slant board Aerobic Stationary Bike: 8' seat at 10 Standing Gait Training: heel raise and heel striike with knee extension Supine Quad Sets: 10 reps Terminal Knee Extension: 10 reps Knee Extension: PROM;4 sets Prone  Prone Knee Hang: Limitations Prone Knee Hang Limitations: 10' STM to hamstrings Other Prone Exercises: terminal flextion/extesion.    Modalities Modalities: Cryotherapy Manual Therapy Manual Therapy: Joint mobilization Joint Mobilization: Grade II-III tibialfemoral mobs, fibular-femoral mobs whils in terminal flextion Cryotherapy Number Minutes Cryotherapy: 10 Minutes Cryotherapy Location: Knee Type of Cryotherapy: Ice pack  Physical Therapy Assessment and Plan PT Assessment and Plan Clinical Impression Statement: Unable to find [employer] referral for extension, refaxed to MD.  Session focus on improvoing ROM and improving gait mechanics.  Pt ROM 16-108.  PROM 14 degrees extension. PT Plan: Continue focusing on ROM not strengthening.  Pt to continue active and long sit passive hamstring  stretches    Goals    Problem List Patient Active Problem List   Diagnosis Date Noted  . Stiffness of left knee 02/10/2013  . Pain in joint, lower leg 02/10/2013  . Difficulty in walking(719.7) 02/10/2013  . Osteoarthritis of right knee 01/19/2013  . Muscle weakness (generalized) 01/28/2012  . Pain in joint, shoulder region 01/28/2012  . Right rotator cuff tear 12/13/2011    PT - End of Session Activity Tolerance: Patient tolerated treatment well General Behavior During Therapy: [facility] for tasks assessed/performed  GP    Juel Burrow 02/24/2013, 4:27 PM

## 2013-02-26 ENCOUNTER — Ambulatory Visit (HOSPITAL_COMMUNITY)
Admission: RE | Admit: 2013-02-26 | Discharge: 2013-02-26 | Disposition: A | Discharge status: Home / Self Care | Payer: Medicare Other | Source: Ambulatory Visit | Attending: Family Medicine | Admitting: Family Medicine

## 2013-02-26 DIAGNOSIS — M25662 Stiffness of left knee, not elsewhere classified: Secondary | ICD-10-CM

## 2013-02-26 DIAGNOSIS — R262 Difficulty in walking, not elsewhere classified: Secondary | ICD-10-CM

## 2013-02-26 NOTE — Progress Notes (Signed)
Physical Therapy Treatment Patient Details  Name: Taylor Bean MRN: [id number] Date of Birth: [birth year]  Today's Date: 02/26/2013 Time: 7829-5621 PT Time Calculation (min): 42 min Charge ;TE 3086-5784, Self care 1455-1505, Ice [id number]  Visit#: 8 of 12  Re-eval: 03/12/13 Assessment Diagnosis: Rt TKR Surgical Date: 01/19/13 Next MD Visit: Dr. Dion Saucier - 03/22/2013? Prior Therapy: HH  Authorization: medicare blue  Authorization Time Period:    Authorization Visit#: 8 of 10   Subjective: Symptoms/Limitations Symptoms: Pt stated he has rough day today, rode his riding mower yesterday and reports increased pain following. Pain Assessment Currently in Pain?: Yes Pain Score: 6  Pain Location: Knee Pain Orientation: Right  Objective   Exercise/Treatments Stretches Active Hamstring Stretch: 3 reps;30 seconds Passive Hamstring Stretch: 2 reps;60 seconds Passive Hamstring Stretch Limitations: long sit on mat Gastroc Stretch: 3 reps;30 seconds;Limitations Gastroc Stretch Limitations: slant board Aerobic Stationary Bike: 8' seat at 10 Supine Quad Sets: 10 reps Short Arc Quad Sets: 10 reps Terminal Knee Extension: 10 reps Knee Extension: PROM;4 sets   Modalities Modalities: Cryotherapy Cryotherapy Number Minutes Cryotherapy: 10 Minutes Cryotherapy Location: Knee Type of Cryotherapy: Ice pack  Physical Therapy Assessment and Plan PT Assessment and Plan Clinical Impression Statement: Signed referral received from MD.  LE measured and faxed to [employer] brace company for extension place.  Session focus on improving ROM.  AROM 16-108, PROM 12 degrees extension.  PT Plan: Continue focusing on ROM not strengthening.  Pt to continue active and long sit passive hamstring stretches    Goals    Problem List Patient Active Problem List   Diagnosis Date Noted  . Stiffness of left knee 02/10/2013  . Pain in joint, lower leg 02/10/2013  . Difficulty in walking(719.7) 02/10/2013  .  Osteoarthritis of right knee 01/19/2013  . Muscle weakness (generalized) 01/28/2012  . Pain in joint, shoulder region 01/28/2012  . Right rotator cuff tear 12/13/2011    PT - End of Session Activity Tolerance: Patient tolerated treatment well General Behavior During Therapy: [facility] for tasks assessed/performed  GP    Juel Burrow 02/26/2013, 7:21 PM

## 2013-03-01 ENCOUNTER — Ambulatory Visit (HOSPITAL_COMMUNITY)
Admission: RE | Admit: 2013-03-01 | Discharge: 2013-03-01 | Disposition: A | Discharge status: Home / Self Care | Payer: Medicare Other | Source: Ambulatory Visit | Attending: Family Medicine | Admitting: Family Medicine

## 2013-03-01 DIAGNOSIS — M25662 Stiffness of left knee, not elsewhere classified: Secondary | ICD-10-CM

## 2013-03-01 DIAGNOSIS — R262 Difficulty in walking, not elsewhere classified: Secondary | ICD-10-CM

## 2013-03-01 NOTE — Progress Notes (Signed)
Physical Therapy Treatment Patient Details  Name: Taylor Bean MRN: [id number] Date of Birth: [birth year]  Today's Date: 03/01/2013 Time: 9147-8295 PT Time Calculation (min): 44 min  Visit#: 9 of 12  Re-eval: 03/12/13    Authorization: medicare blue  Authorization Time Period:    Authorization Visit#:  9/ of   12  Subjective: Symptoms/Limitations Symptoms: Pt states his pain seems to be rising not lowering Pain Assessment Pain Score: 7  Pain Location: Knee Pain Orientation: Right Pain Type: Acute pain  Precautions/Restrictions     Exercise/Treatments   Stretches Passive Hamstring Stretch: 2 reps;60 seconds supine Quad Sets: 2 sets;10 reps Heel Slides: 10 reps   Prone  Other Prone Exercises: quad set 2 sets of 10 Other Prone Exercises: contract relax to increase extension   Manual Therapy Manual Therapy: Joint mobilization Joint Mobilization: Grade II/III to tibiofemoral; fibulofemoral to increase flexion.   Myofascial Release: to lateral/medial/posterior aspect of LE to release adhesions.    Physical Therapy Assessment and Plan PT Assessment and Plan Clinical Impression Statement: Pt treatment revolved aroung decreasing swelling/pain and ROM>  Pt AROM to 112 with pt trying to increase flextion three times prior to final  measurment. PT Plan: focus on ROM and pain issues.  Pt continues to walk flat footed pt encouraged to walk hitting with heel first. Pt will need g-code next visit.  Fill out LEFS    Goals  progressing  Problem List Patient Active Problem List   Diagnosis Date Noted  . Stiffness of left knee 02/10/2013  . Pain in joint, lower leg 02/10/2013  . Difficulty in walking(719.7) 02/10/2013  . Osteoarthritis of right knee 01/19/2013  . Muscle weakness (generalized) 01/28/2012  . Pain in joint, shoulder region 01/28/2012  . Right rotator cuff tear 12/13/2011    PT - End of Session Activity Tolerance: Patient tolerated treatment  well General Behavior During Therapy: [facility] for tasks assessed/performed  GP    Betzy Barbier,[name] 03/01/2013, 2:44 PM

## 2013-03-03 ENCOUNTER — Ambulatory Visit (HOSPITAL_COMMUNITY)
Admission: RE | Admit: 2013-03-03 | Discharge: 2013-03-03 | Disposition: A | Discharge status: Home / Self Care | Payer: Medicare Other | Source: Ambulatory Visit | Attending: Family Medicine | Admitting: Family Medicine

## 2013-03-03 NOTE — Progress Notes (Addendum)
Physical Therapy Treatment Patient Details  Name: Taylor Bean MRN: [id number] Date of Birth: 21-Jun-1941  Today's Date: 03/03/2013 Time: 0865-7846 PT Time Calculation (min): 53 min  Visit#: 10 of 12  Re-eval: 03/12/13 Charges: Therex 96'(2952-8413) Manual x 23' (1500-1523)  Authorization: medicare blue   Authorization Visit#: 10 of 15   Subjective: Symptoms/Limitations Symptoms: Pt states that manual techniques decreased pain last session. Pain Assessment Currently in Pain?: Yes Pain Score: 4  Pain Location: Knee Pain Orientation: Right   Exercise/Treatments Aerobic Stationary Bike: 8' seat at 10 Supine Quad Sets: 2 sets;10 reps Short Arc Quad Sets: 10 reps Knee Extension: PROM Knee Flexion: PROM   Manual Therapy Joint Mobilization: Grade II/III A/P joint mobs to right knee to decrease pain and improve motion Myofascial Release: to lateral/medial/posterior aspect of RLE to release adhesions.  Physical Therapy Assessment and Plan PT Assessment and Plan Clinical Impression Statement: Tx focus once again on decreasing pain and fascial restrictions. Active flexion is 112 once again this session passive extension is 12. Pt has not heard from JAS rep. Pt has decreased pain after manual techniques.  PT Plan: Continue per PT POC with focus on improving ROM and decreasing pain.     Problem List Patient Active Problem List   Diagnosis Date Noted  . Stiffness of left knee 02/10/2013  . Pain in joint, lower leg 02/10/2013  . Difficulty in walking(719.7) 02/10/2013  . Osteoarthritis of right knee 01/19/2013  . Muscle weakness (generalized) 01/28/2012  . Pain in joint, shoulder region 01/28/2012  . Right rotator cuff tear 12/13/2011    PT - End of Session Activity Tolerance: Patient tolerated treatment well General Behavior During Therapy: [facility] for tasks assessed/performed  Seth Bake, PTA  03/03/2013, 4:02 PM

## 2013-03-05 ENCOUNTER — Ambulatory Visit (HOSPITAL_COMMUNITY)
Admission: RE | Admit: 2013-03-05 | Discharge: 2013-03-05 | Disposition: A | Discharge status: Home / Self Care | Payer: Medicare Other | Source: Ambulatory Visit | Attending: Family Medicine | Admitting: Family Medicine

## 2013-03-05 NOTE — Progress Notes (Signed)
Physical Therapy Treatment Patient Details  Name: Taylor Bean MRN: [id number] Date of Birth: 1940/09/03  Today's Date: 03/05/2013 Time: 1435-1520 PT Time Calculation (min): 45 min  Visit#: 11 of 12  Re-eval: 03/12/13   Charge there ex 7829-5621; manual 1550-1520  Subjective: Symptoms/Limitations Symptoms: Pt states he is still having decreased pain from manual completed Monday.  Pt states that he was contacted by [employer] and will hopefully have the brace in 10-14 days.   Pain Assessment Currently in Pain?: Yes Pain Score: 4  Pain Location: Knee Pain Orientation: Right Pain Type: Acute pain     Exercise/Treatments   Stretches Passive Hamstring Stretch: 2 reps;60 seconds Gastroc Stretch: 3 reps;30 seconds;Limitations Gastroc Stretch Limitations: slant board    Standing Other Standing Knee Exercises: foot on chair lean in for improved flextion. Supine Quad Sets: 2 sets;10 reps Heel Slides: 10 reps Knee Extension: PROM Knee Flexion: PROM Prone  Other Prone Exercises: quad set 2 sets of 10 Other Prone Exercises: contract relax to increase extension   Modalities Modalities: Moist Heat Manual Therapy Manual Therapy: Other (comment) Other Manual Therapy: superficial lymph manual to decrease swelling; patellar mobs  while supine; prone pt recieved myfascial release techniques to medial hamstring while on HMP to improve extension. Moist Heat Therapy Number Minutes Moist Heat: 10 Minutes Moist Heat Location: Other (comment) (posterior thigh)  Physical Therapy Assessment and Plan PT Assessment and Plan Clinical Impression Statement: Pt medial hamstring remains very tight completed stretching, manual and moist heat to decrase stiffness.  Rx will remained focused on ROM PT Plan: Continue per PT POC with focus on improving ROM and decreasing pain.    Goals  progressing  Problem List Patient Active Problem List   Diagnosis Date Noted  . Stiffness of left knee 02/10/2013   . Pain in joint, lower leg 02/10/2013  . Difficulty in walking(719.7) 02/10/2013  . Osteoarthritis of right knee 01/19/2013  . Muscle weakness (generalized) 01/28/2012  . Pain in joint, shoulder region 01/28/2012  . Right rotator cuff tear 12/13/2011       GP    [name],[name] 03/05/2013, 4:48 PM

## 2013-03-10 ENCOUNTER — Ambulatory Visit (HOSPITAL_COMMUNITY)
Admission: RE | Admit: 2013-03-10 | Discharge: 2013-03-10 | Disposition: A | Discharge status: Home / Self Care | Payer: Medicare Other | Source: Ambulatory Visit | Attending: Family Medicine | Admitting: Family Medicine

## 2013-03-10 NOTE — Evaluation (Addendum)
Physical Therapy Re-evaluation  Patient Details  Name: Taylor Bean MRN: [id number] Date of Birth: 12/04/1940  Today's Date: 03/10/2013 Time: 1302-1330 PT Time Calculation (min): 28 min Charges: MMT/ROMM x 4(0981-1914) Self-care x 20' (1310-1330)               Visit#: 12 of 12  Re-eval: 03/12/13 Assessment Diagnosis: Rt TKR Surgical Date: 01/19/13 Next MD Visit: Dr. Dion Saucier - 03/22/2013? Prior Therapy: HH  Authorization: medicare blue    Authorization Time Period:    Authorization Visit#: 12 of 15   Past Medical History:  Past Medical History  Diagnosis Date  . Hyperlipidemia   . Sleep apnea     uses a cpap-will bring dos  . Arthritis     general  . Depression   . Right rotator cuff tear 12/13/2011  . H/O urinary stone   . Fibromyalgia   . Osteoarthritis of right knee 01/19/2013   Past Surgical History:  Past Surgical History  Procedure Laterality Date  . 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 Joint replacement  2009    rt total hip  . Tonsillectomy    . Leg surgery      fx rt leg as 72 yr old  . Colonoscopy w/ polypectomy      begin  . Rotator cuff repair Right 4/31/13  . Total knee arthroplasty Right 01/19/2013    Procedure: TOTAL KNEE ARTHROPLASTY with right trocanteric bursal injection ;  Surgeon: Eulas Post, MD;  Location: [facility] OR;  Service: Orthopedics;  Laterality: Right;    Subjective Symptoms/Limitations Symptoms: Pt states that pain continues to decrease. Pain Assessment Currently in Pain?: Yes Pain Score: 2  Pain Location: Knee Pain Orientation: Right  Sensation/Coordination/Flexibility/Functional Tests Functional Tests Functional Tests: LEFS 36/80 (was 30/80 on 02/24/13)  Assessment RLE AROM (degrees) Right Knee Extension: 16 (was 16 on ) Right Knee Flexion: 115 (was 105 on 02/19/13) RLE PROM (degrees) Right Knee Extension: 12 (was 14 on 02/24/13) Right Knee Flexion: 118 (was 110 02/19/13) RLE Strength Right Hip Flexion: 5/5 (was 5/5 on  02/19/13) Right Hip Extension: 5/5 (was 5/5 on 02/19/13) Right Hip ABduction: 5/5 (was 5/5 on 02/19/13) Right Knee Flexion: 5/5 (was 4/5 on 02/19/13) Right Knee Extension: 5/5 (was 5/5 on 02/19/13) Right Ankle Dorsiflexion: 5/5 (was 4+/5 on 02/19/13)  Exercise/Treatments Aerobic Stationary Bike: 6'@2 .0 seat 10 to improve ROM Supine Knee Extension: PROM Knee Flexion: PROM  Physical Therapy Assessment and Plan PT Assessment and Plan Clinical Impression Statement: Pt has progressed well with therapy but continues to be limited in right knee extension. Gait deviations have decreased but are still presents secondary to lack of ROM. [employer] rep has been contacted. Pt would benefit from continuing skilled PT to improve ROM and in turn improve gait mechanics and decrease risk for secondary injury. PT Plan: Recommend to continue PT 3x per week for 2 more weeks.    Goals Home Exercise Program Pt/caregiver will Perform Home Exercise Program: For increased ROM PT Short Term Goals Time to Complete Short Term Goals: 2 weeks PT Short Term Goal 1: ROM to be 10-110 to allow more normalized gt and allow pt to sit with comfort for 40 minutes to enjoy a meal PT Short Term Goal 1 - Progress: Progressing toward goal PT Short Term Goal 2: Pt to be able to stand for 15 minutes to take a shower without usint the shower chair PT Short Term Goal 2 - Progress: Met PT Short Term  Goal 3: Pt to be able to walk inside with no assistive device PT Short Term Goal 3 - Progress: Met PT Short Term Goal 4: Pt to be able to walk with the cane for an hour outside PT Short Term Goal 4 - Progress: Met PT Short Term Goal 5: Pt able to get in and out of the car with ease PT Short Term Goal 5 - Progress: Progressing toward goal PT Long Term Goals Time to Complete Long Term Goals: 4 weeks PT Long Term Goal 1: I in advance HEP PT Long Term Goal 1 - Progress: Met PT Long Term Goal 2: PT to be walking inside and out without a cane PT Long  Term Goal 2 - Progress: Met Long Term Goal 3: Rom to 5-120 to allow normalized gt and allow pt to sit for over an hour to enjoy a meal on a cruise ship Long Term Goal 3 Progress: Progressing toward goal Long Term Goal 4: Pt pain to be no greater than a 3/10 80% of the day Long Term Goal 4 Progress: Met PT Long Term Goal 5: Pt to be sleeping in his bed again with comfort Long Term Goal 5 Progress: Progressing toward goal Additional PT Long Term Goals?: Yes PT Long Term Goal 6: LEFS to be increased by at least 20 pts Long Term Goal 6 Progress: Met  Problem List Patient Active Problem List   Diagnosis Date Noted  . Stiffness of left knee 02/10/2013  . Pain in joint, lower leg 02/10/2013  . Difficulty in walking(719.7) 02/10/2013  . Osteoarthritis of right knee 01/19/2013  . Muscle weakness (generalized) 01/28/2012  . Pain in joint, shoulder region 01/28/2012  . Right rotator cuff tear 12/13/2011    PT - End of Session Activity Tolerance: Patient tolerated treatment well General Behavior During Therapy: [facility] for tasks assessed/performed   Seth Bake, PTA  03/10/2013, 2:59 PM  Physician Documentation Your signature is required to indicate approval of the treatment plan as stated above.  Please sign and either send electronically or make a copy of this report for your files and return this physician signed original.   Please mark one 1.__approve of plan  2. ___approve of plan with the following conditions.   ______________________________                                                          _____________________ Physician Signature                                                                                                             Date

## 2013-03-12 ENCOUNTER — Ambulatory Visit (HOSPITAL_COMMUNITY)
Admission: RE | Admit: 2013-03-12 | Discharge: 2013-03-12 | Disposition: A | Discharge status: Home / Self Care | Payer: Medicare Other | Source: Ambulatory Visit | Attending: Family Medicine | Admitting: Family Medicine

## 2013-03-12 DIAGNOSIS — R262 Difficulty in walking, not elsewhere classified: Secondary | ICD-10-CM

## 2013-03-12 DIAGNOSIS — M25662 Stiffness of left knee, not elsewhere classified: Secondary | ICD-10-CM

## 2013-03-12 NOTE — Progress Notes (Signed)
Physical Therapy Treatment Patient Details  Name: Taylor Bean MRN: [id number] Date of Birth: Sep 09, 1940  Today's Date: 03/12/2013 Time: 1015-1108 PT Time Calculation (min): 45 min Charge:  There ex 4098-1191; manual 1034-1100; HMP 1100-1110 Visit#: 13 of 18  Re-eval: 03/24/13    Authorization: medicare blue   Authorization Visit#: 13 of 18   Subjective:  Pt states pain is decreasing;  Pt has walked a mile and a half.     Exercise/Treatments  Stretches Passive Hamstring Stretch: 1 rep;60 seconds Aerobic Stationary Bike: 6'@2 .0 seat 10 to improve ROM Supine Quad Sets: 10 reps Knee Extension: PROM Prone  Other Prone Exercises: quad set x 10   Modalities Modalities: Moist Heat Manual Therapy Manual Therapy: Other (comment) Myofascial Release: to hamstring mm to decrease fascial restriction  Moist Heat Therapy Number Minutes Moist Heat: 10 Minutes Cryotherapy Cryotherapy Location: Knee  Physical Therapy Assessment and Plan PT Assessment and Plan Clinical Impression Statement: Pt therapy continued to emphasis extension.  PT Plan: Continue to stress extension    Goals  progressing  Problem List Patient Active Problem List   Diagnosis Date Noted  . Stiffness of left knee 02/10/2013  . Pain in joint, lower leg 02/10/2013  . Difficulty in walking(719.7) 02/10/2013  . Osteoarthritis of right knee 01/19/2013  . Muscle weakness (generalized) 01/28/2012  . Pain in joint, shoulder region 01/28/2012  . Right rotator cuff tear 12/13/2011    PT - End of Session Activity Tolerance: Patient tolerated treatment well  GP    Vernel Donlan,[name] 03/12/2013, 4:56 PM

## 2013-03-17 ENCOUNTER — Ambulatory Visit (HOSPITAL_COMMUNITY)
Admission: RE | Admit: 2013-03-17 | Discharge: 2013-03-17 | Disposition: A | Discharge status: Home / Self Care | Payer: Medicare Other | Source: Ambulatory Visit | Attending: Family Medicine | Admitting: Family Medicine

## 2013-03-17 DIAGNOSIS — IMO0001 Reserved for inherently not codable concepts without codable children: Secondary | ICD-10-CM | POA: Insufficient documentation

## 2013-03-17 DIAGNOSIS — M6281 Muscle weakness (generalized): Secondary | ICD-10-CM | POA: Insufficient documentation

## 2013-03-17 DIAGNOSIS — R262 Difficulty in walking, not elsewhere classified: Secondary | ICD-10-CM

## 2013-03-17 DIAGNOSIS — M25662 Stiffness of left knee, not elsewhere classified: Secondary | ICD-10-CM

## 2013-03-17 DIAGNOSIS — M25669 Stiffness of unspecified knee, not elsewhere classified: Secondary | ICD-10-CM | POA: Insufficient documentation

## 2013-03-17 DIAGNOSIS — M25569 Pain in unspecified knee: Secondary | ICD-10-CM | POA: Insufficient documentation

## 2013-03-17 NOTE — Progress Notes (Signed)
Physical Therapy Treatment Patient Details  Name: KAYDON HUSBY MRN: [id number] Date of Birth: 1941-07-09  Today's Date: 03/17/2013 Time: 4098-1191 PT Time Calculation (min): 62 min Charge: TE 39' 1040-1050, 1103-1132, Manual 10' 1052-1102, Ice 10' 1132-1142  Visit#: 14 of 18  Re-eval: 03/24/13 Assessment Diagnosis: Rt TKR Surgical Date: 01/19/13 Next MD Visit: Dr. Dion Saucier - 03/22/2013? Prior Therapy: HH  Authorization: medicare blue  Authorization Time Period:    Authorization Visit#: 14 of 18   Subjective: Symptoms/Limitations Symptoms: Pt still has not heard from [employer], pt reported pain behind knee cap.   Pain Assessment Currently in Pain?: Yes Pain Score: 3  Pain Location: Knee Pain Orientation: Right  Objective:   Exercise/Treatments Stretches Passive Hamstring Stretch: 1 rep;60 seconds Passive Hamstring Stretch Limitations: long sit on mat Quad Stretch: 3 reps;30 seconds Gastroc Stretch: 3 reps;30 seconds;Limitations Gastroc Stretch Limitations: slant board  Aerobic Stationary Bike: 8'@2 .0 seat 10 to improve ROM Supine Quad Sets: 15 reps;Limitations Quad Sets Limitations: 10" holds Knee Extension: PROM Prone  Prone Knee Hang: Limitations Prone Knee Hang Limitations: 10' STM to hamstrings Other Prone Exercises: TKE 15x 10"   Modalities Modalities: Cryotherapy Manual Therapy Manual Therapy: Myofascial release Joint Mobilization: Grade II/III S/I and A/P joint mobs to right knee to decrease pain and improve motion Myofascial Release: to hamstring mm to decrease fascial restriction Cryotherapy Number Minutes Cryotherapy: 10 Minutes Cryotherapy Location: Knee Type of Cryotherapy: Ice pack  Physical Therapy Assessment and Plan PT Assessment and Plan Clinical Impression Statement: Continued treatment focus on knee extension.  Called [employer] company and left message for brace status. PT Plan: Continue to stress extension    Goals    Problem  List Patient Active Problem List   Diagnosis Date Noted  . Stiffness of left knee 02/10/2013  . Pain in joint, lower leg 02/10/2013  . Difficulty in walking(719.7) 02/10/2013  . Osteoarthritis of right knee 01/19/2013  . Muscle weakness (generalized) 01/28/2012  . Pain in joint, shoulder region 01/28/2012  . Right rotator cuff tear 12/13/2011    PT - End of Session Activity Tolerance: Patient tolerated treatment well General Behavior During Therapy: [facility] for tasks assessed/performed  GP    Juel Burrow 03/17/2013, 11:41 AM

## 2013-03-18 ENCOUNTER — Telehealth (HOSPITAL_COMMUNITY): Payer: Self-pay

## 2013-03-19 ENCOUNTER — Ambulatory Visit (HOSPITAL_COMMUNITY)
Admission: RE | Admit: 2013-03-19 | Discharge: 2013-03-19 | Disposition: A | Discharge status: Home / Self Care | Payer: Medicare Other | Source: Ambulatory Visit | Attending: Family Medicine | Admitting: Family Medicine

## 2013-03-19 DIAGNOSIS — R262 Difficulty in walking, not elsewhere classified: Secondary | ICD-10-CM

## 2013-03-19 DIAGNOSIS — M25662 Stiffness of left knee, not elsewhere classified: Secondary | ICD-10-CM

## 2013-03-19 NOTE — Evaluation (Signed)
Physical Therapy Reassessment Patient Details  Name: Taylor Bean MRN: [id number] Date of Birth: 1941/06/16  Today's Date: 03/19/2013 Time: 0930-1030 PT Time Calculation (min): 60 min              Visit#: 15 of 21  Re-eval:   Assessment Diagnosis: Rt TKR Surgical Date: 01/19/13 Next MD Visit: Dr. Dion Saucier - 03/22/2013? Prior Therapy: HH Charge:  There ex 930-950; manual [phone]; ROM 1010-1015; IP [phone] Authorization Visit#: 15 of 18   Past Medical History:  Past Medical History  Diagnosis Date  . Hyperlipidemia   . Sleep apnea     uses a cpap-will bring dos  . Arthritis     general  . Depression   . Right rotator cuff tear 12/13/2011  . H/O urinary stone   . Fibromyalgia   . Osteoarthritis of right knee 01/19/2013   Past Surgical History:  Past Surgical History  Procedure Laterality Date  . 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 Joint replacement  2009    rt total hip  . Tonsillectomy    . Leg surgery      fx rt leg as 72 yr old  . Colonoscopy w/ polypectomy      begin  . Rotator cuff repair Right 4/31/13  . Total knee arthroplasty Right 01/19/2013    Procedure: TOTAL KNEE ARTHROPLASTY with right trocanteric bursal injection ;  Surgeon: Eulas Post, MD;  Location: [facility] OR;  Service: Orthopedics;  Laterality: Right;    Subjective Symptoms/Limitations Symptoms: Contacted insurance yesterday about JAS for justification, pain scale 3/10 How long can you sit comfortably?: 30 Minutes (was 20 minutes How long can you stand comfortably?: 20-30 minutes (was 5-10) How long can you walk comfortably?: 20-30 minutes walking a mile and a half (was 15 minutes independently) Pain Assessment Currently in Pain?: Yes Pain Score: 3  Pain Location: Knee Pain Orientation: Right  Precautions/Restrictions     Assessment RLE AROM (degrees) Right Knee Extension: 18 Right Knee Flexion: 120 RLE PROM (degrees) Right Knee Extension: 12  Exercise/Treatments  Stretches Passive  Hamstring Stretch: 1 rep;60 seconds Passive Hamstring Stretch Limitations: long sit on mat Gastroc Stretch: 3 reps;30 seconds;Limitations Gastroc Stretch Limitations: slant board  Aerobic Stationary Bike: 8'@2 .0 seat 10 to improve ROM   Supine Quad Sets: 15 reps;Limitations Knee Extension: PROM Sidelying   Prone  Prone Knee Hang: Limitations Prone Knee Hang Limitations: 10' STM to hamstrings Other Prone Exercises: TKE 15x 10"   Modalities Modalities: Cryotherapy Manual Therapy Manual Therapy: Myofascial release Myofascial Release: to decrease hamstring fascial restrictions along with decongestive manual techniques to decrease swelling Cryotherapy Number Minutes Cryotherapy: 10 Minutes Cryotherapy Location: Knee  Physical Therapy Assessment and Plan PT Assessment and Plan Clinical Impression Statement: Pt gt is more normalized. Pt has walked for a mile and a half without discomfort.  Pt is to recieve his JAS brace next week.   PT Plan: Pt to be decreased to once a week once JAS has been acquired.  Until then work aggressively on extension.   See  Three times a week for one week then decrease to one time a week for four weeks Goals Home Exercise Program PT Goal: Perform Home Exercise Program - Progress: Met PT Short Term Goals PT Short Term Goal 1: ROM to be 10-110 to allow more normalized gt and allow pt to sit with comfort for 40 minutes to enjoy a meal (goal for flextion has been met but not extension) PT  Short Term Goal 1 - Progress: Progressing toward goal PT Short Term Goal 2: Pt to be able to stand for 15 minutes to take a shower without usint the shower chair PT Short Term Goal 2 - Progress: Met PT Short Term Goal 3: Pt to be able to walk inside with no assistive device PT Short Term Goal 3 - Progress: Met PT Short Term Goal 4: Pt to be able to walk with the cane for an hour outside PT Short Term Goal 4 - Progress: Met PT Short Term Goal 5: Pt able to get in and out  of the car with ease PT Short Term Goal 5 - Progress: Met PT Long Term Goals Time to Complete Long Term Goals: 4 weeks PT Long Term Goal 1: I in advance HEP PT Long Term Goal 1 - Progress: Met PT Long Term Goal 2: PT to be walking inside and out without a cane PT Long Term Goal 2 - Progress: Met Long Term Goal 3: Rom to 5-120 to allow normalized gt and allow pt to sit for over an hour to enjoy a meal on a cruise ship (goal for flexion has been met but not extension) Long Term Goal 3 Progress: Progressing toward goal Long Term Goal 4: Pt pain to be no greater than a 3/10 80% of the day Long Term Goal 4 Progress: Met PT Long Term Goal 5: Pt to be sleeping in his bed again with comfort Long Term Goal 5 Progress: Met PT Long Term Goal 6: LEFS to be increased by at least 20 pts Long Term Goal 6 Progress: Met  Problem List Patient Active Problem List   Diagnosis Date Noted  . Stiffness of left knee 02/10/2013  . Pain in joint, lower leg 02/10/2013  . Difficulty in walking(719.7) 02/10/2013  . Osteoarthritis of right knee 01/19/2013  . Muscle weakness (generalized) 01/28/2012  . Pain in joint, shoulder region 01/28/2012  . Right rotator cuff tear 12/13/2011    PT - End of Session Activity Tolerance: Patient tolerated treatment well General Behavior During Therapy: [facility] for tasks assessed/performed  GP    [name],[name] 03/19/2013, 12:40 PM  Physician Documentation Your signature is required to indicate approval of the treatment plan as stated above.  Please sign and either send electronically or make a copy of this report for your files and return this physician signed original.   Please mark one 1.__approve of plan  2. ___approve of plan with the following conditions.   ______________________________                                                          _____________________ Physician Signature                                                                                                              Date

## 2013-03-22 ENCOUNTER — Ambulatory Visit (HOSPITAL_COMMUNITY): Payer: Self-pay | Admitting: *Deleted

## 2013-03-22 ENCOUNTER — Telehealth (HOSPITAL_COMMUNITY): Payer: Self-pay

## 2013-03-24 ENCOUNTER — Ambulatory Visit (HOSPITAL_COMMUNITY)
Admission: RE | Admit: 2013-03-24 | Discharge: 2013-03-24 | Disposition: A | Discharge status: Home / Self Care | Payer: Medicare Other | Source: Ambulatory Visit | Attending: Family Medicine | Admitting: Family Medicine

## 2013-03-24 NOTE — Progress Notes (Signed)
Physical Therapy Treatment Patient Details  Name: Taylor Bean MRN: [id number] Date of Birth: 10-Apr-1941  Today's Date: 03/24/2013 Time: 0930-1030 PT Time Calculation (min): 60 min  Visit#: 16 of 18  Re-eval: 04/16/13 Authorization: medicare blue  Authorization Visit#: 16 of 18  Charges:  therex 930-945 (15'), manual [phone] (16'), icepack 1005-1015 (10')  Subjective: Symptoms/Limitations Symptoms: Pt states his JAS brace should be delivered to his home by Friday.  States he leaves for his cruise on Saturday so may/may not bring it depending on if rep is able to instruct him on wear.  Pt reports compliance with HEP. Pain Assessment Currently in Pain?: Yes Pain Score: 3  Pain Location: Knee Pain Orientation: Right   Exercise/Treatments Aerobic Stationary Bike: 8'@2 .0 seat 10 to improve ROM Supine Quad Sets: 15 reps;Limitations Knee Extension: PROM Prone  Prone Knee Hang: Limitations Prone Knee Hang Limitations: 10' STM to hamstrings Other Prone Exercises: TKE 15x 10"   Modalities Modalities: Cryotherapy Manual Therapy Manual Therapy: Myofascial release Myofascial Release: Prone: with knee hang to decrease hamstring and posterior fascial restrictions; Supine:  anterior/lateral and medial knee to decrease adhesions Cryotherapy Number Minutes Cryotherapy: 10 Minutes Cryotherapy Location: Knee Type of Cryotherapy: Ice pack  Physical Therapy Assessment and Plan PT Assessment and Plan Clinical Impression Statement: More adhesions anterior knee vs posterior knee.  Able to decrease 50-75%.  Complaint with HEP.  Pt will be on cruise next week; resume therapy when returns PT Plan: Continue therapy 1X week for 2 more weeks.  Work aggressively on extension.       Problem List Patient Active Problem List   Diagnosis Date Noted  . Stiffness of left knee 02/10/2013  . Pain in joint, lower leg 02/10/2013  . Difficulty in walking(719.7) 02/10/2013  . Osteoarthritis of right  knee 01/19/2013  . Muscle weakness (generalized) 01/28/2012  . Pain in joint, shoulder region 01/28/2012  . Right rotator cuff tear 12/13/2011    PT - End of Session Activity Tolerance: Patient tolerated treatment well General Behavior During Therapy: [facility] for tasks assessed/performed   Lurena Nida, PTA/CLT 03/24/2013, 10:36 AM

## 2013-03-26 ENCOUNTER — Ambulatory Visit (HOSPITAL_COMMUNITY): Payer: Self-pay | Admitting: Physical Therapy

## 2013-03-29 ENCOUNTER — Ambulatory Visit (HOSPITAL_COMMUNITY): Payer: Self-pay | Admitting: Physical Therapy

## 2013-03-31 ENCOUNTER — Ambulatory Visit (HOSPITAL_COMMUNITY): Payer: Self-pay | Admitting: *Deleted

## 2013-04-02 ENCOUNTER — Ambulatory Visit (HOSPITAL_COMMUNITY): Payer: Self-pay

## 2013-04-09 ENCOUNTER — Ambulatory Visit (HOSPITAL_COMMUNITY)
Admission: RE | Admit: 2013-04-09 | Discharge: 2013-04-09 | Disposition: A | Discharge status: Home / Self Care | Payer: Medicare Other | Source: Ambulatory Visit | Attending: Family Medicine | Admitting: Family Medicine

## 2013-05-10 ENCOUNTER — Other Ambulatory Visit (HOSPITAL_COMMUNITY): Payer: Self-pay

## 2013-05-10 ENCOUNTER — Other Ambulatory Visit: Payer: Self-pay | Admitting: Orthopedic Surgery

## 2013-05-18 ENCOUNTER — Inpatient Hospital Stay: Admit: 2013-05-18 | Payer: Self-pay | Admitting: Orthopedic Surgery

## 2013-05-18 SURGERY — ARTHROPLASTY, KNEE, TOTAL
Anesthesia: General | Laterality: Left

## 2013-06-18 ENCOUNTER — Ambulatory Visit (HOSPITAL_COMMUNITY)
Admission: RE | Admit: 2013-06-18 | Discharge: 2013-06-18 | Disposition: A | Discharge status: Home / Self Care | Payer: Medicare Other | Source: Ambulatory Visit | Attending: Family Medicine | Admitting: Family Medicine

## 2013-06-18 DIAGNOSIS — IMO0001 Reserved for inherently not codable concepts without codable children: Secondary | ICD-10-CM | POA: Insufficient documentation

## 2013-06-18 DIAGNOSIS — M25569 Pain in unspecified knee: Secondary | ICD-10-CM | POA: Insufficient documentation

## 2013-06-18 DIAGNOSIS — M25662 Stiffness of left knee, not elsewhere classified: Secondary | ICD-10-CM

## 2013-06-18 DIAGNOSIS — M25669 Stiffness of unspecified knee, not elsewhere classified: Secondary | ICD-10-CM | POA: Insufficient documentation

## 2013-06-18 DIAGNOSIS — M6281 Muscle weakness (generalized): Secondary | ICD-10-CM | POA: Insufficient documentation

## 2013-06-18 DIAGNOSIS — R262 Difficulty in walking, not elsewhere classified: Secondary | ICD-10-CM | POA: Insufficient documentation

## 2013-06-18 NOTE — Evaluation (Signed)
Physical Therapy Evaluation  Patient Details  Name: PHARELL ROLFSON MRN: [id number] Date of Birth: [birth year]  Today's Date: 06/18/2013 Time: 0800-0845 PT Time Calculation (min): 45 min Charge:  Evaluation 800-840             Visit#: 1 of 8  Re-eval: 07/18/13 Assessment Diagnosis: Rt TKR Surgical Date: 01/19/13 Next MD Visit: Dr. Dion Saucier - 03/22/2013? Prior Therapy: HH  Authorization: medicare blue       Authorization Visit#: 1 of 8   Past Medical History:  Past Medical History  Diagnosis Date  . Hyperlipidemia   . Sleep apnea     uses a cpap-will bring dos  . Arthritis     general  . Depression   . Right rotator cuff tear 12/13/2011  . H/O urinary stone   . Fibromyalgia   . Osteoarthritis of right knee 01/19/2013   Past Surgical History:  Past Surgical History  Procedure Laterality Date  . 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 Joint replacement  2009    rt total hip  . Tonsillectomy    . Leg surgery      fx rt leg as 72 yr old  . Colonoscopy w/ polypectomy      begin  . Rotator cuff repair Right 4/31/13  . Total knee arthroplasty Right 01/19/2013    Procedure: TOTAL KNEE ARTHROPLASTY with right trocanteric bursal injection ;  Surgeon: Eulas Post, MD;  Location: [facility] OR;  Service: Orthopedics;  Laterality: Right;    Subjective Symptoms/Limitations Symptoms: Pt had a TKR on 01/19/2013.  He recieved OP treatments here from 02/10/2013 until 03/24/2013 with good results except for extension of his knee.  He was discharged with a JAS brace.  The pt now comes back to therapy with orders for a HEP and to see pt   to maximize function.  Mr. Hyson states that  he is still having pain with actiivities.   How long can you sit comfortably?: Pt states that after he sits when he gets up he has a significant amount of pain in his knee and feels unstable.  Able to sit for 15 minutes was 30 in September. How long can you stand comfortably?: Able to stand for 30 minutes was 30  minutes. How long can you walk comfortably?: Pt is able to walk for 15 minutes was 20-30 minutes.     Special Tests: waking up one to two times a night .   Pain Assessment Currently in Pain?: Yes (pain is more on the anterior lateral aspect of his leg.) Pain Score: 8  Pain Location: Knee Pain Orientation: Right     Sensation/Coordination/Flexibility/Functional Tests Functional Tests Functional Tests: foto FS 40 adjusted 41  Assessment RLE AROM (degrees) Right Knee Extension: 25 Right Knee Flexion: 117 RLE PROM (degrees) Right Knee Extension: 20 RLE Strength Right Hip Flexion: 5/5 Right Hip Extension: 5/5 Right Hip ABduction: 5/5 Right Knee Flexion: 5/5 Right Knee Extension: 5/5 Right Ankle Dorsiflexion: 4/5 (was 5/5)  Exercise/Treatments    Stretches Passive Hamstring Stretch: 1 rep;60 seconds   Supine Quad Sets: 10 reps Knee Extension: PROM  Manual Therapy Manual Therapy: Other (comment) Other Manual Therapy: Laser for mm stiffness/pain chronic continuous at 60 J/cm2 x 1:24;x 3 to lateral aspect of knee  Physical Therapy Assessment and Plan PT Assessment and Plan Clinical Impression Statement: Pt with s/p TKR with complaint of continued pain.  Pt to department with eval and treat order.  Pt exam demonstrates  increased adhesions, decreased ROM and increased pain. Pt will benefit from skilled therapeutic intervention in order to improve on the following deficits: Decreased activity tolerance;Decreased balance;Decreased strength;Difficulty walking;Pain;Increased edema;Decreased range of motion Rehab Potential: Good PT Frequency: Min 3X/week PT Duration: 4 weeks PT Treatment/Interventions: Manual techniques;Patient/family education;Gait training;Modalities PT Plan: See twice a week for four weeks to improve pain, ROM working with manual, modalities and exercise.      Goals Home Exercise Program Pt/caregiver will Perform Home Exercise Program: For increased ROM;For  increased strengthening PT Short Term Goals Time to Complete Short Term Goals: 2 weeks PT Short Term Goal 1: ROM to be 18-120 to allow more normalized gt and allow pt to sit with comfort for 40 minutes to enjoy a meal PT Short Term Goal 2: Pain level to be no greater than 6/10 PT Short Term Goal 3: Pt to only be waking one time a night  PT Long Term Goals Time to Complete Long Term Goals: 4 weeks PT Long Term Goal 1: I in advance HEP PT Long Term Goal 2: Pt to be able to travel for 2 hours without discomfort Long Term Goal 3: Pt ROM to be 12-120 to allow more normalized gt. PT Long Term Goal 5: Pt to be able to sleep through the night without waking  Problem List Patient Active Problem List   Diagnosis Date Noted  . Stiffness of left knee 02/10/2013  . Pain in joint, lower leg 02/10/2013  . Difficulty in walking(719.7) 02/10/2013  . Osteoarthritis of right knee 01/19/2013  . Muscle weakness (generalized) 01/28/2012  . Pain in joint, shoulder region 01/28/2012  . Right rotator cuff tear 12/13/2011    PT - End of Session Activity Tolerance: Patient tolerated treatment well General Behavior During Therapy: WFL for tasks assessed/performed PT Plan of Care PT Home Exercise Plan: given  GP Functional Assessment Tool Used: foto Self Care Current Status (Z6109): At least 60 percent but less than 80 percent impaired, limited or restricted Self Care Goal Status (U0454): At least 40 percent but less than 60 percent impaired, limited or restricted  [name],[name] 06/18/2013, 9:37 AM  Physician Documentation Your signature is required to indicate approval of the treatment plan as stated above.  Please sign and either send electronically or make a copy of this report for your files and return this physician signed original.   Please mark one 1.__approve of plan  2. ___approve of plan with the following conditions.   ______________________________                                                           _____________________ Physician Signature                                                                                                             Date

## 2013-06-21 ENCOUNTER — Ambulatory Visit (HOSPITAL_COMMUNITY)
Admission: RE | Admit: 2013-06-21 | Discharge: 2013-06-21 | Disposition: A | Discharge status: Home / Self Care | Payer: Medicare Other | Source: Ambulatory Visit | Attending: Orthopedic Surgery | Admitting: Orthopedic Surgery

## 2013-06-21 NOTE — Progress Notes (Signed)
Physical Therapy Treatment Patient Details  Name: Taylor Bean MRN: [id number] Date of Birth: 1941/02/21  Today's Date: 06/21/2013 Time: 1355-1430 PT Time Calculation (min): 35 min  Visit#: 2 of 8  Re-eval: 07/18/13 Authorization: medicare blue  Authorization Visit#: 2 of 8  Charges:  therex 1355-1403 (8'),  manual 1405-1430 (25')  Subjective: Symptoms/Limitations Symptoms: Pt states last visit really helped his knee.  States he had very little pain the remainder of the day.  Reports compliance with HEP. Pain Assessment Currently in Pain?: Yes Pain Score: 6  Pain Location: Knee Pain Orientation: Right;Lateral   Exercise/Treatments Aerobic Stationary Bike: 8'@2 .0 seat 10 to improve ROM   Manual Therapy Manual Therapy: Other (comment) Myofascial Release: supine to lateral knee and ITB to decrease adhesions and restrictions. Other Manual Therapy: Laser for mm stiffness/pain chronic continuous at 60 J/cm2 x 1:24; x 5 to lateral aspect of knee  Physical Therapy Assessment and Plan PT Assessment and Plan Clinical Impression Statement: Overall improvement following last session.  Good response to MFR lateral knee and ITB with palpable reduction in adhesions.  Tight ITB.  PT Plan: See twice a week for four weeks to improve pain, ROM working with manual, modalities and exercise.  Instruct with standing ITB stretch next visit.     Problem List Patient Active Problem List   Diagnosis Date Noted  . Stiffness of left knee 02/10/2013  . Pain in joint, lower leg 02/10/2013  . Difficulty in walking(719.7) 02/10/2013  . Osteoarthritis of right knee 01/19/2013  . Muscle weakness (generalized) 01/28/2012  . Pain in joint, shoulder region 01/28/2012  . Right rotator cuff tear 12/13/2011    PT - End of Session Activity Tolerance: Patient tolerated treatment well General Behavior During Therapy: [facility] for tasks assessed/performed PT Plan of Care PT Home Exercise Plan: given   Taylor Bean, PTA/CLT 06/21/2013, 3:12 PM

## 2013-06-22 ENCOUNTER — Ambulatory Visit (HOSPITAL_COMMUNITY): Payer: Self-pay | Admitting: *Deleted

## 2013-06-24 ENCOUNTER — Ambulatory Visit (HOSPITAL_COMMUNITY)
Admission: RE | Admit: 2013-06-24 | Discharge: 2013-06-24 | Disposition: A | Discharge status: Home / Self Care | Payer: Medicare Other | Source: Ambulatory Visit | Attending: Orthopedic Surgery | Admitting: Orthopedic Surgery

## 2013-06-24 DIAGNOSIS — M25662 Stiffness of left knee, not elsewhere classified: Secondary | ICD-10-CM

## 2013-06-24 DIAGNOSIS — M25561 Pain in right knee: Secondary | ICD-10-CM

## 2013-06-24 DIAGNOSIS — R262 Difficulty in walking, not elsewhere classified: Secondary | ICD-10-CM

## 2013-06-24 NOTE — Progress Notes (Signed)
Physical Therapy Treatment Patient Details  Name: Taylor Bean MRN: [id number] Date of Birth: Feb 11, 1941  Today's Date: 06/24/2013 Time: 4098-1191 PT Time Calculation (min): 50 min Charge there ex [phone]; manual [phone] Visit#: 3 of 8    Authorization: medicare blue  Authorization Visit#: 3 of 8   Subjective: Symptoms/Limitations Symptoms: Pt states that he can really tell a difference. Pain Assessment Pain Score: 2  Pain Location: Knee Pain Orientation: Right;Lateral     Exercise/Treatments ITB Stretch: 3 reps;30 seconds Aerobic Stationary Bike:  (Nustep L3 3.0 x 7') Supine Quad Sets: 10 reps Knee Extension: PROM Prone  Other Prone Exercises: terminal ext x 10   Manual Therapy Manual Therapy: Other (comment) Myofascial Release: manual techniques performed on hamstring mm as well as ITB to decrease adhesions and restrictions. Noted swelling along lateral anterior aspect knee jt with manual retro techniques used to promote decongestion. Other Manual Therapy: laser for mm stiffness/pain on chronic setting continuous at 60 J/cm2 x 1:24 x 4 to lateral aspect of knee  Physical Therapy Assessment and Plan PT Assessment and Plan Clinical Impression Statement: Pt continues to state that he has decreased pain.  Improved extension noted as well as decreased tightness in ITB.  Pt will benefit from continued skilled therapy for manual techniques to improve pain levle and quality of life. PT Plan: See twice a week for four weeks to improve pain, ROM working with manual, modalities and exercise.      Goals  progressing   Problem List Patient Active Problem List   Diagnosis Date Noted  . Stiffness of left knee 02/10/2013  . Pain in joint, lower leg 02/10/2013  . Difficulty in walking(719.7) 02/10/2013  . Osteoarthritis of right knee 01/19/2013  . Muscle weakness (generalized) 01/28/2012  . Pain in joint, shoulder region 01/28/2012  . Right rotator cuff tear 12/13/2011     PT - End of Session Activity Tolerance: Patient tolerated treatment well General Behavior During Therapy: [facility] for tasks assessed/performed  GP    [name],[name] 06/24/2013, 11:16 AM

## 2013-06-28 ENCOUNTER — Ambulatory Visit (HOSPITAL_COMMUNITY)
Admission: RE | Admit: 2013-06-28 | Discharge: 2013-06-28 | Disposition: A | Discharge status: Home / Self Care | Payer: Medicare Other | Source: Ambulatory Visit | Attending: Family Medicine | Admitting: Family Medicine

## 2013-06-28 DIAGNOSIS — M25561 Pain in right knee: Secondary | ICD-10-CM

## 2013-06-28 DIAGNOSIS — R262 Difficulty in walking, not elsewhere classified: Secondary | ICD-10-CM

## 2013-06-28 DIAGNOSIS — M25662 Stiffness of left knee, not elsewhere classified: Secondary | ICD-10-CM

## 2013-06-28 NOTE — Progress Notes (Signed)
Physical Therapy Treatment Patient Details  Name: Taylor Bean MRN: [id number] Date of Birth: 1940-10-28  Today's Date: 06/28/2013 Time: 0800-0845 PT Time Calculation (min): 45 min There ex [phone]; manual [phone] Visit#: 4 of 8  Re-eval: 07/18/13    Authorization: medicare blue  Authorization Visit#: 4 of 8   Subjective: Symptoms/Limitations Symptoms: Pt states that his pain has decreased significantly Pain Assessment Pain Score: 2    Exercise/Treatments  Stretches Active Hamstring Stretch: 3 reps;30 seconds Passive Hamstring Stretch: 1 rep;60 seconds Aerobic Stationary Bike: Nustep L 3 x 6:00  to improve functional extension. Supine Quad Sets: 10 reps Terminal Knee Extension: 10 reps Knee Extension: PROM Prone   terminal extension x 10   Manual Therapy Manual Therapy: Myofascial release Myofascial Release: MFR performed on hamstring to decrease fascial restrictions; manual decongestive techniques used to decrease swelling. Other Manual Therapy: Laser used on lateral aspect of knee using mm stiffness/pain on chronic setting continuout at 60 J/cm2 x 4 to lateral aspect of knee.    Physical Therapy Assessment and Plan PT Assessment and Plan Clinical Impression Statement: Noted decreased swellling along anterior and lateral aspect of knee.   PT Plan: See twice a week for four weeks to improve pain, ROM working with manual, modalities and exercise.         Problem List Patient Active Problem List   Diagnosis Date Noted  . Stiffness of left knee 02/10/2013  . Pain in joint, lower leg 02/10/2013  . Difficulty in walking(719.7) 02/10/2013  . Osteoarthritis of right knee 01/19/2013  . Muscle weakness (generalized) 01/28/2012  . Pain in joint, shoulder region 01/28/2012  . Right rotator cuff tear 12/13/2011    General Behavior During Therapy: [facility] for tasks assessed/performed  GP    Memory Heinrichs,[name] 06/28/2013, 12:59 PM

## 2013-07-01 ENCOUNTER — Ambulatory Visit (HOSPITAL_COMMUNITY)
Admission: RE | Admit: 2013-07-01 | Discharge: 2013-07-01 | Disposition: A | Discharge status: Home / Self Care | Payer: Medicare Other | Source: Ambulatory Visit | Attending: Orthopedic Surgery | Admitting: Orthopedic Surgery

## 2013-07-01 ENCOUNTER — Telehealth: Payer: Self-pay | Admitting: *Deleted

## 2013-07-01 DIAGNOSIS — M25662 Stiffness of left knee, not elsewhere classified: Secondary | ICD-10-CM

## 2013-07-01 DIAGNOSIS — M25561 Pain in right knee: Secondary | ICD-10-CM

## 2013-07-01 DIAGNOSIS — R262 Difficulty in walking, not elsewhere classified: Secondary | ICD-10-CM

## 2013-07-01 NOTE — Telephone Encounter (Signed)
Faxed CPAP supply order back. 

## 2013-07-01 NOTE — Progress Notes (Signed)
Physical Therapy Treatment Patient Details  Name: Taylor Bean MRN: [id number] Date of Birth: 12/30/1940  Today's Date: 07/01/2013 Time: 1015-1100 PT Time Calculation (min): 45 min Visit#: 5 of 8  Re-eval: 07/18/13  charge:  There ex [id number]; Manual 1033-1100  Authorization: medicare blue  Authorization Visit#: 5 of 8   Subjective: Symptoms/Limitations Symptoms: Pt states he still gets stiff after sitting for 20 minutes or longer.    Exercise/Treatments      Stretches Active Hamstring Stretch: 3 reps;30 seconds Passive Hamstring Stretch: 1 rep;60 seconds Aerobic Stationary Bike: Nustep L 3 x 10:00 Supine Quad Sets: 10 reps Knee Extension: PROM  Prone  Other Prone Exercises: terminal ext x 10   Manual Therapy Manual Therapy: Myofascial release Myofascial Release: MFR performed on Hamstring mm to decrease fascial restrictions; manual decongestive techniques used to decrease swelling and pain Other Manual Therapy: Laser used on lateral aspect of knee using mm stiffness/pain on chronic setting 4 x at 60J/cm2  to lateral aspect of knee  Physical Therapy Assessment and Plan PT Assessment and Plan Clinical Impression Statement: swelling continues to decrease; improved patella mobility today PT Plan: one more treatment of laser than discontinue; may try [place] if pain persists     Goals  progressing  Problem List Patient Active Problem List   Diagnosis Date Noted  . Stiffness of left knee 02/10/2013  . Pain in joint, lower leg 02/10/2013  . Difficulty in walking(719.7) 02/10/2013  . Osteoarthritis of right knee 01/19/2013  . Muscle weakness (generalized) 01/28/2012  . Pain in joint, shoulder region 01/28/2012  . Right rotator cuff tear 12/13/2011    PT - End of Session Activity Tolerance: Patient tolerated treatment well General Behavior During Therapy: [facility] for tasks assessed/performed PT Plan of Care PT Home Exercise Plan: given  GP     Jerimey Burridge,[name] 07/01/2013, 2:12 PM

## 2013-07-05 ENCOUNTER — Telehealth: Payer: Self-pay | Admitting: *Deleted

## 2013-07-05 ENCOUNTER — Telehealth: Payer: Self-pay | Admitting: Cardiovascular Disease

## 2013-07-05 NOTE — Telephone Encounter (Signed)
opened in error.-[employer]

## 2013-07-05 NOTE — Telephone Encounter (Signed)
Patient needs new rx or cpap supplies.  He is going to drugstore today to pick it up.

## 2013-07-05 NOTE — Telephone Encounter (Signed)
Faxed CPAP supply order back. 

## 2013-07-05 NOTE — Telephone Encounter (Signed)
Returned call to [employer] w/ [employer].  RN asked if this is a new pt referral and informed not a new pt.  Stated pt called today and is coming in this afternoon to pick up.  Advised she fax a Rx renewal to Wanda's attention and it will be processed as soon as possible.  Stated pt just called this morning and wants to pick it up today r/t the holidays.  Informed at least a 48 hr turn around is asked, but it will be processed as soon as possible.  Verbalized understanding.  Will fax.    Message forwarded to [place], [place].

## 2013-07-06 ENCOUNTER — Ambulatory Visit (HOSPITAL_COMMUNITY)
Admission: RE | Admit: 2013-07-06 | Discharge: 2013-07-06 | Disposition: A | Discharge status: Home / Self Care | Payer: Medicare Other | Source: Ambulatory Visit | Attending: Orthopedic Surgery | Admitting: Orthopedic Surgery

## 2013-07-06 DIAGNOSIS — M25561 Pain in right knee: Secondary | ICD-10-CM

## 2013-07-06 DIAGNOSIS — M25662 Stiffness of left knee, not elsewhere classified: Secondary | ICD-10-CM

## 2013-07-06 DIAGNOSIS — R262 Difficulty in walking, not elsewhere classified: Secondary | ICD-10-CM

## 2013-07-06 NOTE — Progress Notes (Signed)
Physical Therapy Treatment Patient Details  Name: Taylor Bean MRN: [id number] Date of Birth: January 19, 1941  Today's Date: 07/06/2013 Time: 1055-1140 PT Time Calculation (min): 45 min  Visit#: 6 of 8  Re-eval: 07/18/13    Authorization: medicare blue  Authorization Time Period:    Authorization Visit#: 6 of 8   Subjective: Symptoms/Limitations Symptoms: Pt is still having pain along the lateral aspect of his knee Pain Assessment Currently in Pain?: Yes Pain Score: 3   Precautions/Restrictions     Exercise/Treatments Mobility/Balance        Stretches Active Hamstring Stretch: 3 reps;30 seconds Passive Hamstring Stretch: 1 rep;60 seconds Aerobic Stationary Bike: Nustep L 3 x 10:00 Supine Quad Sets: 10 reps;20 reps Terminal Knee Extension: 10 reps Knee Extension: PROM   Prone  Other Prone Exercises: terminal ext x 10   Modalities Modalities: Ultrasound Manual Therapy Myofascial Release: MFR performed on hamstring and lateral mm to decrease fascial restrictions and pain Ultrasound Ultrasound Location: lateral aspect of Rt knee jt. Ultrasound Parameters: 1.2w/cm2 x 8'  Ultrasound Goals: Pain  Physical Therapy Assessment and Plan PT Assessment and Plan Clinical Impression Statement: improved patella mobility after manual techniques.  Pt fascial adherence remains significant. Began [place] today in lieu of laser PT Plan: assess how [place] treatment reduced sx of pain.    Goals  progressing  Problem List Patient Active Problem List   Diagnosis Date Noted  . Stiffness of left knee 02/10/2013  . Pain in joint, lower leg 02/10/2013  . Difficulty in walking(719.7) 02/10/2013  . Osteoarthritis of right knee 01/19/2013  . Muscle weakness (generalized) 01/28/2012  . Pain in joint, shoulder region 01/28/2012  . Right rotator cuff tear 12/13/2011    PT - End of Session Activity Tolerance: Patient tolerated treatment well General Behavior During Therapy: [facility] for tasks  assessed/performed  GP    Deshanta Lady,[name] 07/06/2013, 11:48 AM

## 2013-07-13 ENCOUNTER — Ambulatory Visit (HOSPITAL_COMMUNITY)
Admission: RE | Admit: 2013-07-13 | Discharge: 2013-07-13 | Disposition: A | Discharge status: Home / Self Care | Payer: Medicare Other | Source: Ambulatory Visit | Attending: Orthopedic Surgery | Admitting: Orthopedic Surgery

## 2013-07-13 DIAGNOSIS — R262 Difficulty in walking, not elsewhere classified: Secondary | ICD-10-CM

## 2013-07-13 DIAGNOSIS — M25561 Pain in right knee: Secondary | ICD-10-CM

## 2013-07-13 DIAGNOSIS — M25662 Stiffness of left knee, not elsewhere classified: Secondary | ICD-10-CM

## 2013-07-13 NOTE — Progress Notes (Signed)
Physical Therapy Treatment Patient Details  Name: Taylor Bean MRN: [id number] Date of Birth: [birth year]  Today's Date: 07/13/2013 Time: 1305-1350 PT Time Calculation (min): 45 min Charge:  [place] 5284-1324; manual 4010-2725; there ex 1335-350 Visit#: 7 of 8  Re-eval: 07/18/13    Authorization: medicare blue  Authorization Visit#: 7 of 8   Subjective: Symptoms/Limitations Symptoms: I still have pain about a 5/10 when I go from sit to stand but then it disipates quickly. Pain Assessment Currently in Pain?: No/denies   Exercise/Treatments   Aerobic Stationary Bike: Nustep L 3 x 10:00 Quad Sets: 15 reps Terminal Knee Extension: 15 reps Knee Extension: PROM   Prone  Other Prone Exercises: terminal extension x 10'  Modalities Modalities: Ultrasound Manual Therapy Myofascial Release: MFR performed on hamstring and ITB to decrease fascial restrictions and pain. Ultrasound Ultrasound Location: distal medial hamstring as well as lateral aspect of Rt knee jt. Ultrasound Parameters: 3 MgHz at 1.4w/cm2 x 5 min each. Ultrasound Goals: Pain  Physical Therapy Assessment and Plan PT Assessment and Plan Clinical Impression Statement: Pt continues to have adhesions in hamstrings and IT band but is almost equal to Lt side at this time.   Pt will benefit from skilled therapeutic intervention in order to improve on the following deficits: Decreased activity tolerance;Decreased balance;Decreased strength;Difficulty walking;Pain;Increased edema;Decreased range of motion PT Plan: Reevaluate next treatment for D/C.      Problem List Patient Active Problem List   Diagnosis Date Noted  . Stiffness of left knee 02/10/2013  . Pain in joint, lower leg 02/10/2013  . Difficulty in walking(719.7) 02/10/2013  . Osteoarthritis of right knee 01/19/2013  . Muscle weakness (generalized) 01/28/2012  . Pain in joint, shoulder region 01/28/2012  . Right rotator cuff tear 12/13/2011       GP     [name],[name] 07/13/2013, 4:14 PM

## 2013-09-14 ENCOUNTER — Ambulatory Visit (INDEPENDENT_AMBULATORY_CARE_PROVIDER_SITE_OTHER): Payer: Medicare HMO | Admitting: Urology

## 2013-09-14 DIAGNOSIS — M545 Low back pain, unspecified: Secondary | ICD-10-CM

## 2013-09-14 DIAGNOSIS — N400 Enlarged prostate without lower urinary tract symptoms: Secondary | ICD-10-CM

## 2013-09-17 ENCOUNTER — Other Ambulatory Visit: Payer: Self-pay | Admitting: Urology

## 2013-09-17 DIAGNOSIS — R972 Elevated prostate specific antigen [PSA]: Secondary | ICD-10-CM

## 2013-09-28 ENCOUNTER — Other Ambulatory Visit: Payer: Self-pay | Admitting: Urology

## 2013-09-28 ENCOUNTER — Ambulatory Visit (HOSPITAL_COMMUNITY)
Admission: RE | Admit: 2013-09-28 | Discharge: 2013-09-28 | Disposition: A | Discharge status: Home / Self Care | Payer: Medicare HMO | Source: Ambulatory Visit | Attending: Urology | Admitting: Urology

## 2013-09-28 ENCOUNTER — Encounter (HOSPITAL_COMMUNITY): Payer: Self-pay

## 2013-09-28 DIAGNOSIS — N400 Enlarged prostate without lower urinary tract symptoms: Secondary | ICD-10-CM | POA: Insufficient documentation

## 2013-09-28 DIAGNOSIS — R972 Elevated prostate specific antigen [PSA]: Secondary | ICD-10-CM | POA: Insufficient documentation

## 2013-09-28 MED ORDER — LIDOCAINE HCL (PF) 2 % IJ SOLN: 10.0000 mL | Freq: Once | INTRAMUSCULAR | Status: AC

## 2013-09-28 MED ORDER — GENTAMICIN SULFATE 40 MG/ML IJ SOLN: 160.0000 mg | Freq: Once | INTRAMUSCULAR | Status: AC

## 2013-09-28 MED ORDER — LIDOCAINE HCL (PF) 2 % IJ SOLN
INTRAMUSCULAR | Status: AC
  Administered 2013-09-28: 10 mL
  Filled 2013-09-28: qty 10

## 2013-09-28 MED ORDER — GENTAMICIN SULFATE 40 MG/ML IJ SOLN
INTRAMUSCULAR | Status: AC
  Administered 2013-09-28: 160 mg via INTRAMUSCULAR
  Filled 2013-09-28: qty 4

## 2013-09-28 NOTE — Discharge Instructions (Signed)
Prostate Biopsy TRUS Biopsy BEFORE THE TEST   Do not take aspirin. Do not take any medicine that has aspirin in it 7 days before your biopsy.  You may be given a medicine to take on the day of your biopsy.  You may also be given a medicine or treatment to help you go poop (laxative or enema). AFTER THE TEST  Only take medicine as told by your doctor.  It is normal to have some bleeding from your rectum for the first 5 days.  You may have blood in your pee (urine) or sperm. Finding out the results of your test Ask when your test results will be ready. Make sure you get your test results. GET HELP RIGHT AWAY IF:  You have a temperature by mouth above 102 F (38.9 C), not controlled by medicine.  You have blood in your pee for more than 5 days.  You have a lot of blood in your pee.  You have bleeding from your rectum for more than 5 days or have a lot of blood in your poop (feces).  You have severe pain. Document Released: 06/19/2009 Document Revised: 09/23/2011 Document Reviewed: 02/17/2013 [facility] Patient Information 2014 [place], [place].

## 2013-10-08 ENCOUNTER — Encounter: Payer: Self-pay | Admitting: Urology

## 2013-11-09 ENCOUNTER — Ambulatory Visit (INDEPENDENT_AMBULATORY_CARE_PROVIDER_SITE_OTHER): Payer: Commercial Managed Care - HMO | Admitting: Cardiology

## 2013-11-09 ENCOUNTER — Encounter: Payer: Self-pay | Admitting: Cardiology

## 2013-11-09 VITALS — BP 134/60 | HR 69 | Ht 73.0 in | Wt 243.0 lb

## 2013-11-09 DIAGNOSIS — R0602 Shortness of breath: Secondary | ICD-10-CM

## 2013-11-09 NOTE — Progress Notes (Signed)
Clinical Summary [name] is a 73 y.o.male seen today as a new patient  1. Fatigue/SOB - multiple ortho surgeries over the last few years, has had decreased activity since that time.  - 6-7 months ago tried to increase his activity, has noted some increased fatigue and DOE.  - mild DOE with activities such as mowing his yard, picking weeks, etc. Denies significant symptoms with walking distances, though somewhat limited by his chronic knee pain - no chest pain, no palpitations, no orthopnea, no LE edema.  2. OSA - compliant with CPAP  3. Hyperlipidemia - compliant with zocor - followed by pcp  Past Medical History  Diagnosis Date  . Hyperlipidemia   . Sleep apnea     uses a cpap-will bring dos  . Arthritis     general  . Depression   . Right rotator cuff tear 12/13/2011  . H/O urinary stone   . Fibromyalgia   . Osteoarthritis of right knee 01/19/2013     Allergies  Allergen Reactions  . Other     Standard Epoxy resin used for joint replacement   . Adhesive [Tape] Hives, Itching and Rash     Current Outpatient Prescriptions  Medication Sig Dispense Refill  . aspirin 81 MG tablet Take 81 mg by mouth daily.        . celecoxib (CELEBREX) 200 MG capsule Take 200 mg by mouth daily.      Marland Kitchen escitalopram (LEXAPRO) 10 MG tablet Take 10 mg by mouth daily.       . Omega-3 Fatty Acids (FISH OIL PO) Take 1 capsule by mouth 2 (two) times daily.      Marland Kitchen oxyCODONE-acetaminophen (PERCOCET) 10-325 MG per tablet Take 1-2 tablets by mouth every 6 (six) hours as needed for pain. MAXIMUM TOTAL ACETAMINOPHEN DOSE IS 4000 MG PER DAY  75 tablet  0  . sennosides-docusate sodium (SENOKOT-S) 8.6-50 MG tablet Take 2 tablets by mouth daily.  30 tablet  1  . simvastatin (ZOCOR) 20 MG tablet Take 20 mg by mouth at bedtime.         No current facility-administered medications for this visit.     Past Surgical History  Procedure Laterality Date  . 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  Joint replacement  2009    rt total hip  . Tonsillectomy    . Leg surgery      fx rt leg as 73 yr old  . Colonoscopy w/ polypectomy      begin  . Rotator cuff repair Right 4/31/13  . 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 Other     Standard Epoxy resin used for joint replacement   . Adhesive [Tape] Hives, Itching and Rash      Family History  Problem Relation Age of Onset  . Heart attack Father      Social History Taylor Bean reports that he quit smoking about 45 years ago. His smoking use included Cigarettes. He has a 2.5 pack-year smoking history. He does not have any smokeless tobacco history on file. Taylor Bean reports that he drinks about .6 ounces of alcohol per week.   Review of Systems CONSTITUTIONAL: No weight loss, fever, chills, weakness or fatigue.  HEENT: Eyes: No  visual loss, blurred vision, double vision or yellow sclerae.No hearing loss, sneezing, congestion, runny nose or sore throat.  SKIN: No rash or itching.  CARDIOVASCULAR: per HPI RESPIRATORY: No cough or sputum.  GASTROINTESTINAL: No anorexia, nausea, vomiting or diarrhea. No abdominal pain or blood.  GENITOURINARY: No burning on urination, no polyuria NEUROLOGICAL: No headache, dizziness, syncope, paralysis, ataxia, numbness or tingling in the extremities. No change in bowel or bladder control.  MUSCULOSKELETAL: No muscle, back pain, joint pain or stiffness.  LYMPHATICS: No enlarged nodes. No history of splenectomy.  PSYCHIATRIC: No history of depression or anxiety.  ENDOCRINOLOGIC: No reports of sweating, cold or heat intolerance. No polyuria or polydipsia.  Marland Kitchen   Physical Examination p 69 bp 134/60 Wt 243 lbs BMI 32 Gen: resting comfortably, no acute distress HEENT: no scleral icterus, pupils  equal round and reactive, no palptable cervical adenopathy,  CV: RRR, no m/r/g, no JVD, no carotid bruits Resp: Clear to auscultation bilaterally GI: abdomen is soft, non-tender, non-distended, normal bowel sounds, no hepatosplenomegaly MSK: extremities are warm, no edema.  Skin: warm, no rash Neuro:  no focal deficits Psych: appropriate affect   Diagnostic Studies EKG: NSR    Assessment and Plan  1. Fatgue/SOB - fairly mild non-specific symptoms, no clear cardiac component by history of exam - will obtain echo to evaluate for underlying structural heart disease   - f/u 4 weeks to review testing results      Arnoldo Lenis, M.D., F.A.C.C.

## 2013-11-09 NOTE — Patient Instructions (Signed)
Your physician recommends that you schedule a follow-up appointment in: 4 weeks    Your physician has requested that you have an echocardiogram. Echocardiography is a painless test that uses sound waves to create images of your heart. It provides your doctor with information about the size and shape of your heart and how well your heart's chambers and valves are working. This procedure takes approximately one hour. There are no restrictions for this procedure.     Thank you for choosing [facility] !

## 2013-11-10 ENCOUNTER — Encounter: Payer: Self-pay | Admitting: Cardiology

## 2013-11-10 ENCOUNTER — Ambulatory Visit (HOSPITAL_COMMUNITY)
Admission: RE | Admit: 2013-11-10 | Discharge: 2013-11-10 | Disposition: A | Discharge status: Home / Self Care | Payer: Medicare HMO | Source: Ambulatory Visit | Attending: Cardiology | Admitting: Cardiology

## 2013-11-10 DIAGNOSIS — I517 Cardiomegaly: Secondary | ICD-10-CM

## 2013-11-10 DIAGNOSIS — G4733 Obstructive sleep apnea (adult) (pediatric): Secondary | ICD-10-CM | POA: Insufficient documentation

## 2013-11-10 DIAGNOSIS — E785 Hyperlipidemia, unspecified: Secondary | ICD-10-CM | POA: Insufficient documentation

## 2013-11-10 DIAGNOSIS — Z6832 Body mass index (BMI) 32.0-32.9, adult: Secondary | ICD-10-CM | POA: Insufficient documentation

## 2013-11-10 DIAGNOSIS — R0609 Other forms of dyspnea: Secondary | ICD-10-CM | POA: Insufficient documentation

## 2013-11-10 DIAGNOSIS — R0602 Shortness of breath: Secondary | ICD-10-CM

## 2013-11-10 DIAGNOSIS — R0989 Other specified symptoms and signs involving the circulatory and respiratory systems: Principal | ICD-10-CM | POA: Insufficient documentation

## 2013-11-10 NOTE — Progress Notes (Signed)
  Echocardiogram 2D Echocardiogram has been performed.  Taylor Bean 11/10/2013, 12:03 PM

## 2013-12-23 ENCOUNTER — Ambulatory Visit (INDEPENDENT_AMBULATORY_CARE_PROVIDER_SITE_OTHER): Payer: Commercial Managed Care - HMO | Admitting: Cardiology

## 2013-12-23 ENCOUNTER — Encounter: Payer: Self-pay | Admitting: Cardiology

## 2013-12-23 VITALS — BP 142/82 | HR 58 | Ht 73.0 in | Wt 256.0 lb

## 2013-12-23 DIAGNOSIS — R0602 Shortness of breath: Secondary | ICD-10-CM | POA: Diagnosis not present

## 2013-12-23 NOTE — Patient Instructions (Signed)
Your physician wants you to follow-up in: 1 year with DrBranch You will receive a reminder letter in the mail two months in advance. If you don't receive a letter, please call our office to schedule the follow-up appointment.     Your physician recommends that you continue on your current medications as directed. Please refer to the Current Medication list given to you today.      Thank you for choosing [facility] !        

## 2013-12-23 NOTE — Progress Notes (Signed)
Clinical Summary Mr. Cianci is a 73 y.o.male seen today for follow up of the following medical problems.  1. Fatigue/SOB  - multiple ortho surgeries over the last few years, has had decreased activity since that time.  - 6-7 months ago tried to increase his activity, has noted some fatigue and DOE.  - mild DOE with activities such as mowing his yard, picking weeds, etc. Denies significant symptoms with walking long distances, though somewhat limited by his chronic knee pain  - no chest pain, no palpitations, no orthopnea, no LE edema.  -  walks on treadmill x10 minutes, then bike x 10-15 minutes.   - since last visit completed an echo which showed LVEF 16-60%, grade I diastolic dysfunction.      Past Medical History  Diagnosis Date  . Hyperlipidemia   . Sleep apnea     uses a cpap-will bring dos  . Arthritis     general  . Depression   . Right rotator cuff tear 12/13/2011  . H/O urinary stone   . Fibromyalgia   . Osteoarthritis of right knee 01/19/2013     Allergies  Allergen Reactions  . Other     Standard Epoxy resin used for joint replacement   . Adhesive [Tape] Hives, Itching and Rash     Current Outpatient Prescriptions  Medication Sig Dispense Refill  . celecoxib (CELEBREX) 200 MG capsule Take 200 mg by mouth daily.      Marland Kitchen escitalopram (LEXAPRO) 10 MG tablet Take 10 mg by mouth daily.       . simvastatin (ZOCOR) 20 MG tablet Take 20 mg by mouth at bedtime.         No current facility-administered medications for this visit.     Past Surgical History  Procedure Laterality Date  . 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 Joint replacement  2009    rt total hip  . Tonsillectomy    . Leg surgery      fx rt leg as 73 yr old  . Colonoscopy w/ polypectomy      begin  . Rotator cuff repair Right 4/31/13  . Total knee arthroplasty Right 01/19/2013    Procedure: TOTAL KNEE ARTHROPLASTY with right trocanteric bursal injection ;  Surgeon: Johnny Bridge, MD;   Location: [facility];  Service: Orthopedics;  Laterality: Right;     Allergies  Allergen Reactions  . Other     Standard Epoxy resin used for joint replacement   . Adhesive [Tape] Hives, Itching and Rash      Family History  Problem Relation Age of Onset  . Heart attack Father      Social History Mr. Keetch reports that he quit smoking about 45 years ago. His smoking use included Cigarettes. He has a 2.5 pack-year smoking history. He does not have any smokeless tobacco history on file. Mr. Chain reports that he drinks about .6 ounces of alcohol per week.   Review of Systems CONSTITUTIONAL: Fatigue HEENT: Eyes: No visual loss, blurred vision, double vision or yellow sclerae.No hearing loss, sneezing, congestion, runny nose or sore throat.  SKIN: No rash or itching.  CARDIOVASCULAR: per HPI RESPIRATORY: No shortness of breath, cough or sputum.  GASTROINTESTINAL: No anorexia, nausea, vomiting or diarrhea. No abdominal pain or blood.  GENITOURINARY: No burning on urination, no polyuria NEUROLOGICAL: No headache, dizziness, syncope, paralysis, ataxia, numbness or tingling in the extremities. No change in bowel or bladder control.  MUSCULOSKELETAL:  No muscle, back pain, joint pain or stiffness.  LYMPHATICS: No enlarged nodes. No history of splenectomy.  PSYCHIATRIC: No history of depression or anxiety.  ENDOCRINOLOGIC: No reports of sweating, cold or heat intolerance. No polyuria or polydipsia.  Marland Kitchen   Physical Examination p 58 bp 142/82 Wt 256 lbs BMI 34 Gen: resting comfortably, no acute distress HEENT: no scleral icterus, pupils equal round and reactive, no palptable cervical adenopathy,  CV: RRR, no m/r/g, no JVD, no carotid bruits Resp: Clear to auscultation bilaterally GI: abdomen is soft, non-tender, non-distended, normal bowel sounds, no hepatosplenomegaly MSK: extremities are warm, no edema.  Skin: warm, no rash Neuro:  no focal deficits Psych: appropriate  affect   Diagnostic Studies 11/10/13 Echo Study Conclusions  - Procedure narrative: Transthoracic echocardiography. Image quality was suboptimal. The study was technically difficult, as a result of body habitus. - Left ventricle: The cavity size was normal. Wall thickness was increased in a pattern of mild LVH. Systolic function was normal. The estimated ejection fraction was in the range of 60% to 65%. Images were inadequate for LV wall motion assessment. However, no gross regional abnormalities were noted in available views. Doppler parameters are consistent with abnormal left ventricular relaxation (grade 1 diastolic dysfunction). - Aortic valve: Trileaflet; mildly thickened, mildly calcified leaflets. There was no stenosis. - Mitral valve: Mildly thickened leaflets . - Left atrium: The atrium was mildly dilated. - Right ventricle: The cavity size was mildly dilated. Systolic function was normal. - Tricuspid valve: Mildly thickened leaflets.   12/23/13 EKG NSR, no ischemic changes   Assessment and Plan  1. Fatgue/SOB  - fairly mild non-specific symptoms, no clear cardiac component by history or exam. By history he is able to achieve fairly normal to high levels of exertion for his age.  - echo without any significant structural heart disease - no further workup at this time, if symptoms ever progress or he develops chest pain consider ischemic evaluation at that time.  F/u 1 year      Arnoldo Lenis, M.D., F.A.C.C.

## 2014-03-15 ENCOUNTER — Ambulatory Visit (INDEPENDENT_AMBULATORY_CARE_PROVIDER_SITE_OTHER): Payer: Commercial Managed Care - HMO | Admitting: Urology

## 2014-03-15 DIAGNOSIS — N400 Enlarged prostate without lower urinary tract symptoms: Secondary | ICD-10-CM

## 2014-03-15 DIAGNOSIS — R972 Elevated prostate specific antigen [PSA]: Secondary | ICD-10-CM

## 2014-05-17 ENCOUNTER — Telehealth: Payer: Self-pay | Admitting: Cardiovascular Disease

## 2014-05-17 NOTE — Telephone Encounter (Signed)
Patient needs order for CPAP supplies (hose, reservoir, masks)   Forwarded to [employer] [facility] Dr. Claiborne Billings to advise

## 2014-05-17 NOTE — Telephone Encounter (Signed)
Patient is returning a call from Jenna. 

## 2014-05-17 NOTE — Telephone Encounter (Signed)
LM with wife for patient to return call.  

## 2014-05-17 NOTE — Telephone Encounter (Signed)
Pt called in stating that he needs a prescription for a CPAP machine sent to [facility].  Fax: [phone]  Thanks

## 2014-05-18 NOTE — Telephone Encounter (Signed)
Ok to proceed with order to his [employer] company

## 2014-05-19 NOTE — Telephone Encounter (Signed)
Order for CPAP supplies sent to [facility] per patient's request. Order approved by Dr. Claiborne Billings.

## 2014-05-23 ENCOUNTER — Telehealth: Payer: Self-pay | Admitting: Cardiovascular Disease

## 2014-05-23 NOTE — Telephone Encounter (Signed)
I did not send a fax. Do not know what she is referring to.

## 2014-05-23 NOTE — Telephone Encounter (Signed)
She need his demographics and insurance information please. She said she just received a fax from here.

## 2014-05-23 NOTE — Telephone Encounter (Signed)
I spoke with Melissa at [facility] and she already obtained the demographics and insurance information since her earlier phone call.  She states that the pt has [employer] and at this time she is researching whether [facility] can assist this pt with CPAP.  She states that some plans with [facility] are out of network. Melissa will contact our office if they cannot assist the pt. Mariann Laster faxed this order to [facility] last week for CPAP.

## 2014-05-24 ENCOUNTER — Telehealth: Payer: Self-pay | Admitting: Cardiovascular Disease

## 2014-05-24 NOTE — Telephone Encounter (Signed)
She wanted you to know [facility] is not in network with [employer],she know [facility] is in that network.

## 2014-05-24 NOTE — Telephone Encounter (Signed)
Taylor Bean follows sleep study information for Dr Claiborne Billings  Forward to [facility]

## 2014-06-13 ENCOUNTER — Telehealth: Payer: Self-pay | Admitting: *Deleted

## 2014-06-13 NOTE — Telephone Encounter (Signed)
PT is needing CPAP supplies.

## 2014-06-13 NOTE — Telephone Encounter (Signed)
Sent to [facility] Per phone note

## 2014-06-14 ENCOUNTER — Telehealth: Payer: Self-pay | Admitting: Cardiovascular Disease

## 2014-06-14 NOTE — Telephone Encounter (Signed)
Please call,concerning getting new C-Pap supplies.

## 2014-06-14 NOTE — Telephone Encounter (Signed)
Patient called in and stated that [facility] only recognizes [facility] as an approved DME company for his CPAP supplies. Fax # [phone] for faxing order for supplies.   Routed to [place], [place] to review and advise.

## 2014-06-15 NOTE — Telephone Encounter (Signed)
Patient will just need a referral sent to [facility]. Please let patient know this will be done as soon as possible, however I am in clinic everyday this week and will more than likely won't be able to get to it before Friday.

## 2014-06-16 ENCOUNTER — Telehealth: Payer: Self-pay | Admitting: Cardiovascular Disease

## 2014-06-16 NOTE — Telephone Encounter (Signed)
Pt called in stating that a prescription for some CPAP supplies needs to be sent to [facility]. Please call  Thanks

## 2014-06-17 NOTE — Telephone Encounter (Signed)
Returned a call to patient to confirm where the order should be sent. I have gotten 2 different requests. One for [facility] and one for [facility]. Patient wants me to send it to [facility]. Informed patient that a order was faxed on 05/19/14 to [phone]. But I will need to do a completely new referral if he is changing companies. I also informed patient that since it has been 6 approx 6 years since his last study they may require that he get another one. Patient voiced understanding of this information.

## 2014-06-27 NOTE — Telephone Encounter (Signed)
referal done 

## 2014-09-13 ENCOUNTER — Ambulatory Visit (INDEPENDENT_AMBULATORY_CARE_PROVIDER_SITE_OTHER): Payer: Commercial Managed Care - HMO | Admitting: Urology

## 2014-09-13 DIAGNOSIS — N400 Enlarged prostate without lower urinary tract symptoms: Secondary | ICD-10-CM | POA: Diagnosis not present

## 2014-09-13 DIAGNOSIS — R972 Elevated prostate specific antigen [PSA]: Secondary | ICD-10-CM

## 2014-09-29 DIAGNOSIS — H524 Presbyopia: Secondary | ICD-10-CM | POA: Diagnosis not present

## 2014-09-29 DIAGNOSIS — H5210 Myopia, unspecified eye: Secondary | ICD-10-CM | POA: Diagnosis not present

## 2014-09-29 DIAGNOSIS — Z0100 Encounter for examination of eyes and vision without abnormal findings: Secondary | ICD-10-CM | POA: Diagnosis not present

## 2014-10-31 DIAGNOSIS — Z1389 Encounter for screening for other disorder: Secondary | ICD-10-CM | POA: Diagnosis not present

## 2014-10-31 DIAGNOSIS — E6609 Other obesity due to excess calories: Secondary | ICD-10-CM | POA: Diagnosis not present

## 2014-10-31 DIAGNOSIS — R739 Hyperglycemia, unspecified: Secondary | ICD-10-CM | POA: Diagnosis not present

## 2014-10-31 DIAGNOSIS — E782 Mixed hyperlipidemia: Secondary | ICD-10-CM | POA: Diagnosis not present

## 2014-10-31 DIAGNOSIS — M1991 Primary osteoarthritis, unspecified site: Secondary | ICD-10-CM | POA: Diagnosis not present

## 2014-10-31 DIAGNOSIS — Z6834 Body mass index (BMI) 34.0-34.9, adult: Secondary | ICD-10-CM | POA: Diagnosis not present

## 2014-10-31 DIAGNOSIS — R5383 Other fatigue: Secondary | ICD-10-CM | POA: Diagnosis not present

## 2014-10-31 DIAGNOSIS — Z0000 Encounter for general adult medical examination without abnormal findings: Secondary | ICD-10-CM | POA: Diagnosis not present

## 2014-11-17 DIAGNOSIS — R197 Diarrhea, unspecified: Secondary | ICD-10-CM | POA: Diagnosis not present

## 2014-11-17 DIAGNOSIS — Z6834 Body mass index (BMI) 34.0-34.9, adult: Secondary | ICD-10-CM | POA: Diagnosis not present

## 2014-11-17 DIAGNOSIS — E6609 Other obesity due to excess calories: Secondary | ICD-10-CM | POA: Diagnosis not present

## 2014-11-17 DIAGNOSIS — N400 Enlarged prostate without lower urinary tract symptoms: Secondary | ICD-10-CM | POA: Diagnosis not present

## 2014-11-17 DIAGNOSIS — K529 Noninfective gastroenteritis and colitis, unspecified: Secondary | ICD-10-CM | POA: Diagnosis not present

## 2014-11-21 DIAGNOSIS — M25551 Pain in right hip: Secondary | ICD-10-CM | POA: Diagnosis not present

## 2014-11-21 DIAGNOSIS — M1711 Unilateral primary osteoarthritis, right knee: Secondary | ICD-10-CM | POA: Diagnosis not present

## 2014-11-22 DIAGNOSIS — G4733 Obstructive sleep apnea (adult) (pediatric): Secondary | ICD-10-CM | POA: Diagnosis not present

## 2014-11-24 ENCOUNTER — Encounter: Payer: Self-pay | Admitting: Gastroenterology

## 2014-12-06 ENCOUNTER — Encounter: Payer: Self-pay | Admitting: Nurse Practitioner

## 2014-12-06 ENCOUNTER — Other Ambulatory Visit: Payer: Self-pay

## 2014-12-06 ENCOUNTER — Ambulatory Visit (INDEPENDENT_AMBULATORY_CARE_PROVIDER_SITE_OTHER): Payer: Commercial Managed Care - HMO | Admitting: Nurse Practitioner

## 2014-12-06 VITALS — BP 123/67 | HR 61 | Temp 97.1°F | Ht 73.0 in | Wt 267.4 lb

## 2014-12-06 DIAGNOSIS — R197 Diarrhea, unspecified: Secondary | ICD-10-CM

## 2014-12-06 MED ORDER — NA SULFATE-K SULFATE-MG SULF 17.5-3.13-1.6 GM/177ML PO SOLN: 1.0000 | ORAL | Status: DC

## 2014-12-06 NOTE — Patient Instructions (Signed)
1. We will schedule your procedure (colonoscopy) for you. 2. Continue avoiding dairy as this is a likely trigger for your symptoms. 3. We will request the records from [facility] regarding her previous colonoscopy. 4. Return in 3 months for further evaluation and follow-up on your symptoms.

## 2014-12-06 NOTE — Assessment & Plan Note (Signed)
Patient with recent occasional (approximately once a week) nonbloody diarrhea. He has made a loose association with his symptoms and consumption of dairy products. He does not typically eat a lot of dairy products, just occasional cheese and rare ice cream. Has always had a history of bowel movements shortly after eating, although these tend to be formed. His PCP child him on a lactose-free diet which she feels has improved his symptoms somewhat. Per the patient, he has not had a colonoscopy for about 10 years. We'll request these records from [facility]. Given his age and colonoscopy approximately 10 years ago he is likely due for a colonoscopy at this point anyway. This will allow [place] to further evaluate his symptoms. Recommend continue avoiding dairy, return for 3 month follow-up and reevaluation of symptoms. We'll plan for colonoscopy with 12.5 mg of Phenergan. Likely has mild underlying irritable bowel with possible lactose intolerance. Cannot rule out other etiology such as microscopic colitis.  Proceed with colonoscopy with Dr. Oneida Alar with 12.5 mg phenergan in the near future. The risks, benefits, and alternatives have been discussed in detail with the patient. They state understanding and desire to proceed.   The patient is not on any anticoagulants. Patient is not on any chronic pain medicines, antianxiety medications. He is on a single antidepressant. He will likely be appropriate for conscious sedation with the addition of 12.5 mg of Phenergan preprocedure.

## 2014-12-06 NOTE — Progress Notes (Signed)
Primary Care Physician:  Leonides Grills, MD Primary Gastroenterologist:  Dr. Oneida Alar  Chief Complaint  Patient presents with  . Diarrhea  . set up TCS    HPI:   74 year old male presents on referral from PCP for intermittent heme pre-diarrhea. PCP notes reviewed. Last visit with PCP 11/17/2014. Celiac panel was done and a trial of lactose free diet was recommended at this visit. Laboratory results not available in the system. Review of electronic medical record does not demonstrate any colonoscopy reports.  Today he states symptoms are periodic. Usually associated with eating. On average happens once a week. When asked he thinks there may be an associated with dairy. One example he ate a cheeseburger and about 5 mins later had watery diarrhea. His normal pattern is a bowel movement shortly after any meal, though is typically no diarrhea. Denies hematochezia and melena. Last colonoscopy approximately 10 years ago in [place]. Thinks his results were one benign polyp. During episodes will sometimes have lower abdominal pressure/dull pain. Denies N/V. Has tried TUMS with unsure of results. Thinks his symptoms have improved since avoiding dairy at last PCP visit. Denies any other upper or lower GI symptoms.  Past Medical History  Diagnosis Date  . Hyperlipidemia   . Sleep apnea     uses a cpap-will bring dos  . Arthritis     general  . Depression   . Right rotator cuff tear 12/13/2011  . H/O urinary stone   . Fibromyalgia   . Osteoarthritis of right knee 01/19/2013  . Prostatitis   . Elevated PSA     Past Surgical History  Procedure Laterality Date  . 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 Tonsillectomy    . Leg surgery      fx rt leg as 74 yr old  . Colonoscopy w/ polypectomy      begin  . Rotator cuff repair Right 4/31/13  . Total knee arthroplasty Right 01/19/2013    Procedure: TOTAL KNEE ARTHROPLASTY with right trocanteric bursal injection ;  Surgeon: Johnny Bridge, MD;   Location: [place];  Service: Orthopedics;  Laterality: Right;    Current Outpatient Prescriptions  Medication Sig Dispense Refill  . celecoxib (CELEBREX) 200 MG capsule Take 200 mg by mouth daily.    . diclofenac (VOLTAREN) 75 MG EC tablet     . escitalopram (LEXAPRO) 10 MG tablet Take 10 mg by mouth daily.     . fluticasone (FLONASE) 50 MCG/ACT nasal spray   11  . simvastatin (ZOCOR) 20 MG tablet Take 20 mg by mouth at bedtime.      . Na Sulfate-K Sulfate-Mg Sulf (SUPREP BOWEL PREP) SOLN Take 1 kit by mouth as directed. 1 Bottle 0   No current facility-administered medications for this visit.    Allergies as of 12/06/2014 - Review Complete 12/06/2014  Allergen Reaction Noted  . Other  01/19/2013  . Adhesive [tape] Hives, Itching, and Rash 01/06/2013    Family History  Problem Relation Age of Onset  . Heart attack Father   . Colon cancer Neg Hx   . Colon polyps Neg Hx     History   Social History  . Marital Status: Married    Spouse Name: N/A  . Number of Children: N/A  . Years of Education: N/A   Occupational History  . Not on file.   Social History Main Topics  . Smoking status: Former Smoker -- 0.25 packs/day for 10 years  Types: Cigarettes    Quit date: 08/01/1968  . Smokeless tobacco: Not on file  . Alcohol Use: 0.6 oz/week    1 Cans of beer per week  . Drug Use: No  . Sexual Activity: Not on file   Other Topics Concern  . Not on file   Social History Narrative    Review of Systems: General: Negative for anorexia, weight loss, fever, chills, fatigue, weakness.   ENT: Negative for hoarseness, difficulty swallowing. CV: Negative for chest pain, angina, palpitations, peripheral edema.  Respiratory: Negative for dyspnea at rest, cough, wheezing.  GI: See history of present illness. Derm: Negative for rash or itching.  Psych: Negative for anxiety, depression.  Endo: Negative for unusual weight change.  Heme: Negative for bruising or bleeding. Allergy:  Negative for rash or hives.    Physical Exam: BP 123/67 mmHg  Pulse 61  Temp(Src) 97.1 F (36.2 C)  Ht 6' 1"  (1.854 m)  Wt 267 lb 6.4 oz (121.292 kg)  BMI 35.29 kg/m2 General:   Alert and oriented. Pleasant and cooperative. Well-nourished and well-developed.  Head:  Normocephalic and atraumatic. Eyes:  Without icterus, sclera clear and conjunctiva pink.  Ears:  Normal auditory acuity. Throat/Neck:  Supple, without mass or thyromegaly. Cardiovascular:  S1, S2 present without murmurs appreciated. Normal pulses noted. Extremities without clubbing or edema. Respiratory:  Clear to auscultation bilaterally. No wheezes, rales, or rhonchi. No distress.  Gastrointestinal:  +BS, soft, non-tender and non-distended. No HSM noted. No guarding or rebound. No masses appreciated.  Rectal:  Deferred  Psych:  Alert and cooperative. Normal mood and affect. Heme/Lymph/Immune: No excessive bruising noted.    12/06/2014 9:11 AM

## 2014-12-06 NOTE — Progress Notes (Signed)
cc'ed to pcp °

## 2014-12-21 DIAGNOSIS — G4733 Obstructive sleep apnea (adult) (pediatric): Secondary | ICD-10-CM | POA: Diagnosis not present

## 2014-12-22 ENCOUNTER — Encounter (HOSPITAL_COMMUNITY): Payer: Self-pay | Admitting: *Deleted

## 2014-12-22 ENCOUNTER — Ambulatory Visit (HOSPITAL_COMMUNITY)
Admission: RE | Admit: 2014-12-22 | Discharge: 2014-12-22 | Disposition: A | Discharge status: Home / Self Care | Payer: Commercial Managed Care - HMO | Source: Ambulatory Visit | Attending: Gastroenterology | Admitting: Gastroenterology

## 2014-12-22 ENCOUNTER — Encounter (HOSPITAL_COMMUNITY)
Admission: RE | Disposition: A | Discharge status: Home / Self Care | Payer: Self-pay | Source: Ambulatory Visit | Attending: Gastroenterology

## 2014-12-22 DIAGNOSIS — K5730 Diverticulosis of large intestine without perforation or abscess without bleeding: Secondary | ICD-10-CM | POA: Insufficient documentation

## 2014-12-22 DIAGNOSIS — M1380 Other specified arthritis, unspecified site: Secondary | ICD-10-CM | POA: Insufficient documentation

## 2014-12-22 DIAGNOSIS — Z9989 Dependence on other enabling machines and devices: Secondary | ICD-10-CM | POA: Diagnosis not present

## 2014-12-22 DIAGNOSIS — M797 Fibromyalgia: Secondary | ICD-10-CM | POA: Diagnosis not present

## 2014-12-22 DIAGNOSIS — Z79899 Other long term (current) drug therapy: Secondary | ICD-10-CM | POA: Diagnosis not present

## 2014-12-22 DIAGNOSIS — D124 Benign neoplasm of descending colon: Secondary | ICD-10-CM | POA: Insufficient documentation

## 2014-12-22 DIAGNOSIS — Z96651 Presence of right artificial knee joint: Secondary | ICD-10-CM | POA: Diagnosis not present

## 2014-12-22 DIAGNOSIS — K648 Other hemorrhoids: Secondary | ICD-10-CM | POA: Diagnosis not present

## 2014-12-22 DIAGNOSIS — G4730 Sleep apnea, unspecified: Secondary | ICD-10-CM | POA: Diagnosis not present

## 2014-12-22 DIAGNOSIS — E785 Hyperlipidemia, unspecified: Secondary | ICD-10-CM | POA: Insufficient documentation

## 2014-12-22 DIAGNOSIS — Z791 Long term (current) use of non-steroidal anti-inflammatories (NSAID): Secondary | ICD-10-CM | POA: Insufficient documentation

## 2014-12-22 DIAGNOSIS — F329 Major depressive disorder, single episode, unspecified: Secondary | ICD-10-CM | POA: Diagnosis not present

## 2014-12-22 DIAGNOSIS — Z87891 Personal history of nicotine dependence: Secondary | ICD-10-CM | POA: Diagnosis not present

## 2014-12-22 DIAGNOSIS — D125 Benign neoplasm of sigmoid colon: Secondary | ICD-10-CM | POA: Diagnosis not present

## 2014-12-22 DIAGNOSIS — Z96641 Presence of right artificial hip joint: Secondary | ICD-10-CM | POA: Insufficient documentation

## 2014-12-22 DIAGNOSIS — K593 Megacolon, not elsewhere classified: Secondary | ICD-10-CM | POA: Diagnosis not present

## 2014-12-22 DIAGNOSIS — Z1211 Encounter for screening for malignant neoplasm of colon: Secondary | ICD-10-CM | POA: Diagnosis not present

## 2014-12-22 DIAGNOSIS — R197 Diarrhea, unspecified: Secondary | ICD-10-CM

## 2014-12-22 DIAGNOSIS — K621 Rectal polyp: Secondary | ICD-10-CM | POA: Diagnosis not present

## 2014-12-22 HISTORY — PX: COLONOSCOPY: SHX5424

## 2014-12-22 SURGERY — COLONOSCOPY
Anesthesia: Moderate Sedation

## 2014-12-22 MED ORDER — SODIUM CHLORIDE 0.9 % IJ SOLN
INTRAMUSCULAR | Status: AC
  Filled 2014-12-22: qty 3

## 2014-12-22 MED ORDER — MIDAZOLAM HCL 5 MG/5ML IJ SOLN
INTRAMUSCULAR | Status: AC
  Filled 2014-12-22: qty 10

## 2014-12-22 MED ORDER — PROMETHAZINE HCL 25 MG/ML IJ SOLN
INTRAMUSCULAR | Status: DC | PRN
  Administered 2014-12-22: 12.5 mg via INTRAVENOUS

## 2014-12-22 MED ORDER — MEPERIDINE HCL 100 MG/ML IJ SOLN
INTRAMUSCULAR | Status: DC | PRN
  Administered 2014-12-22: 50 mg via INTRAVENOUS
  Administered 2014-12-22: 25 mg via INTRAVENOUS

## 2014-12-22 MED ORDER — STERILE WATER FOR IRRIGATION IR SOLN
Status: DC | PRN
  Administered 2014-12-22: 11:00:00

## 2014-12-22 MED ORDER — MIDAZOLAM HCL 5 MG/5ML IJ SOLN
INTRAMUSCULAR | Status: DC | PRN
  Administered 2014-12-22 (×2): 2 mg via INTRAVENOUS
  Administered 2014-12-22: 1 mg via INTRAVENOUS
  Administered 2014-12-22: 2 mg via INTRAVENOUS

## 2014-12-22 MED ORDER — SODIUM CHLORIDE 0.9 % IV SOLN
INTRAVENOUS | Status: DC
  Administered 2014-12-22: 1000 mL via INTRAVENOUS

## 2014-12-22 MED ORDER — MEPERIDINE HCL 100 MG/ML IJ SOLN
INTRAMUSCULAR | Status: AC
  Filled 2014-12-22: qty 2

## 2014-12-22 MED ORDER — PROMETHAZINE HCL 25 MG/ML IJ SOLN
INTRAMUSCULAR | Status: AC
  Filled 2014-12-22: qty 1

## 2014-12-22 MED ORDER — PROMETHAZINE HCL 25 MG/ML IJ SOLN
12.5000 mg | Freq: Once | INTRAMUSCULAR | Status: AC
  Administered 2014-12-22: 12.5 mg via INTRAVENOUS

## 2014-12-22 NOTE — H&P (Signed)
Formatting of this note is different from the original.    Primary Care Physician:  Leonides Grills, MD  Primary Gastroenterologist:  Dr. Oneida Alar    Pre-Procedure History & Physical:  HPI:  Elijah Perry is a 74 y.o. male here for COLON CANCER SCREENING/lactose intolerance.    Past Medical History   Diagnosis Date   ? Hyperlipidemia    ? Sleep apnea      uses a cpap-will bring dos   ? Arthritis      general   ? Depression    ? Right rotator cuff tear 12/13/2011   ? H/O urinary stone    ? Fibromyalgia    ? Osteoarthritis of right knee 01/19/2013   ? Prostatitis    ? Elevated PSA      Past Surgical History   Procedure Laterality Date   ? Total hip arthroplasty  2009     Right   ? Tonsillectomy     ? Leg surgery       fx rt leg as 74 yr old   ? Colonoscopy w/ polypectomy       begin   ? Rotator cuff repair Right 4/31/13   ? Total knee arthroplasty Right 01/19/2013     Procedure: TOTAL KNEE ARTHROPLASTY with right trocanteric bursal injection ;  Surgeon: Johnny Bridge, MD;  Location: [facility];  Service: Orthopedics;  Laterality: Right;     Prior to Admission medications    Medication Sig Start Date End Date Taking? Authorizing Provider   diclofenac (VOLTAREN) 75 MG EC tablet Take 75 mg by mouth 2 (two) times daily.  11/01/14  Yes Historical Provider, MD   escitalopram (LEXAPRO) 10 MG tablet Take 10 mg by mouth daily.    Yes Historical Provider, MD   fluticasone (FLONASE) 50 MCG/ACT nasal spray Place 2 sprays into both nostrils daily.  11/17/14  Yes Historical Provider, MD   Na Sulfate-K Sulfate-Mg Sulf (SUPREP BOWEL PREP) SOLN Take 1 kit by mouth as directed. 12/06/14  Yes Danie Binder, MD   simvastatin (ZOCOR) 20 MG tablet Take 20 mg by mouth at bedtime.     Yes Historical Provider, MD     Allergies as of 12/06/2014 - Review Complete 12/06/2014   Allergen Reaction Noted   ? Other  01/19/2013   ? Adhesive [tape] Hives, Itching, and Rash 01/06/2013     Family History   Problem Relation Age of Onset   ? Heart attack Father    ?  Colon cancer Neg Hx    ? Colon polyps Neg Hx      History     Social History   ? Marital Status: Married     Spouse Name: N/A   ? Number of Children: N/A   ? Years of Education: N/A     Occupational History   ? Not on file.     Social History Main Topics   ? Smoking status: Former Smoker -- 0.25 packs/day for 10 years     Types: Cigarettes     Quit date: 08/01/1968   ? Smokeless tobacco: Not on file   ? Alcohol Use: 0.6 oz/week     1 Cans of beer per week   ? Drug Use: No   ? Sexual Activity: Not on file     Other Topics Concern   ? Not on file     Social History Narrative     Review of Systems:  See HPI, otherwise negative ROS  Physical Exam:  BP 145/69 mmHg  Pulse 71  Temp(Src) 98.1 F (36.7 C) (Oral)  Resp 16  Ht '6\' 1"'  (1.854 m)  Wt 267 lb (121.11 kg)  BMI 35.23 kg/m2  SpO2 93%  General:   Alert,  pleasant and cooperative in NAD  Head:  Normocephalic and atraumatic.  Neck:  Supple;  Lungs:  Clear throughout to auscultation.     Heart:  Regular rate and rhythm.  Abdomen:  Soft, nontender and nondistended. Normal bowel sounds, without guarding, and without rebound.    Neurologic:  Alert and  oriented x4;  grossly normal neurologically.    Impression/Plan:      SCREENING    Plan:    1. TCS TODAY    Electronically signed by Danie Binder, MD at 12/22/2014 11:07 AM EDT

## 2014-12-22 NOTE — Op Note (Signed)
[facility] [address] [place], [address]   COLONOSCOPY PROCEDURE REPORT  PATIENT: Taylor Bean, Taylor Bean  MR#: [id number] BIRTHDATE: 09/07/1940 , 74  yrs. old GENDER: male ENDOSCOPIST: Danie Binder, MD REFERRED DB:[id number] Orson Ape, M.D. PROCEDURE DATE:  Jan 04, 2015 PROCEDURE:   Colonoscopy with snare polypectomy and Colonoscopy with cold biopsy polypectomy INDICATIONS:average risk patient for colorectal cancer. MEDICATIONS: Demerol 75 mg IV, Versed 7 mg IV, and Promethazine (Phenergan) 25 mg IV  DESCRIPTION OF PROCEDURE:    Physical exam was performed.  Informed consent was obtained from the patient after explaining the benefits, risks, and alternatives to procedure.  The patient was connected to monitor and placed in left lateral position. Continuous oxygen was provided by nasal cannula and IV medicine administered through an indwelling cannula.  After administration of sedation and rectal exam, the patients rectum was intubated and the EC-[id number] ([id number])  colonoscope was advanced under direct visualization to the ileum.  The scope was removed slowly by carefully examining the color, texture, anatomy, and integrity mucosa on the way out.  The patient was recovered in endoscopy and discharged home in satisfactory condition.    COLON FINDINGS: The examined terminal ileum appeared to be normal. , Two sessile polyps ranging from 6 to 26mm in size were found in the rectum and descending colon.  A polypectomy was performed using snare cautery.  , The colonic mucosa appeared normal.  Multiple biopsies were performed. FREQUENT DIVERTICULA WITH HYPERTROPHY IN THE LEFT COLON. , Small internal hemorrhoids were found.  , and The colon was redundant.  Manual abdominal counter-pressure was used to reach the cecum.  PREP QUALITY: good.  CECAL W/D TIME: 14       minutes COMPLICATIONS: None  ENDOSCOPIC IMPRESSION: 1.   TWO COLON POLYPS REMOVED 2.   REDUNDANT LEFT  COLON/MODERATE DIVERTICULOSIS IN LEFT COLON 3.   Small internal hemorrhoids  RECOMMENDATIONS: DRINK LACTAID MILK OR USE LACTASE PILLS WHEN CONSUMING DAIRY PRODUCTS. FOLLOW A HIGH FIBER DIET.Marland Kitchen AWAIT BIOPSY RESULTS. Next colonoscopy in 1-3 years.      _______________________________ Lorrin MaisDanie Binder, MD 01/04/2015 12:22 PM   CPT CODES: ICD CODES:  The ICD and CPT codes recommended by this software are interpretations from the data that the clinical staff has captured with the software.  The verification of the translation of this report to the ICD and CPT codes and modifiers is the sole responsibility of the health care institution and practicing physician where this report was generated.  [employer] will not be held responsible for the validity of the ICD and CPT codes included on this report.  AMA assumes no liability for data contained or not contained herein. CPT is a Designer, television/film set of the [employer].

## 2014-12-22 NOTE — Discharge Instructions (Signed)
You had 2 polyps removed. You have small internal hemorrhoids and diverticulosis IN YOUR LEFT COLON. I BIOPSIED YOUR RIGHT COLON. THE LAST PART OF YOUR SMALL BOWEL IS NORMAL.   DRINK LACTAID MILK OR USE LACTASE PILLS IF YOU WANT TO EAT A DAIRY PRODUCTS: CHEESE, ICE CREAM, MILK.  FOLLOW A HIGH FIBER DIET. AVOID ITEMS THAT CAUSE BLOATING. SEE INFO BELOW.  YOUR BIOPSY RESULTS WILL BE AVAILABLE IN MY CHART AFTER JUN 11 AND MY OFFICE WILL CONTACT YOU IN 10-14 DAYS WITH YOUR RESULTS.   FOLLOW UP IN 3 MOS.   Next colonoscopy in 1-3 years.   Colonoscopy Care After Read the instructions outlined below and refer to this sheet in the next week. These discharge instructions provide you with general information on caring for yourself after you leave the hospital. While your treatment has been planned according to the most current medical practices available, unavoidable complications occasionally occur. If you have any problems or questions after discharge, call DR. Iraida Cragin, [phone].  ACTIVITY  You may resume your regular activity, but move at a slower pace for the next 24 hours.   Take frequent rest periods for the next 24 hours.   Walking will help get rid of the air and reduce the bloated feeling in your belly (abdomen).   No driving for 24 hours (because of the medicine (anesthesia) used during the test).   You may shower.   Do not sign any important legal documents or operate any machinery for 24 hours (because of the anesthesia used during the test).    NUTRITION  Drink plenty of fluids.   You may resume your normal diet as instructed by your doctor.   Begin with a light meal and progress to your normal diet. Heavy or fried foods are harder to digest and may make you feel sick to your stomach (nauseated).   Avoid alcoholic beverages for 24 hours or as instructed.    MEDICATIONS  You may resume your normal medications.   WHAT YOU CAN EXPECT TODAY  Some feelings of  bloating in the abdomen.   Passage of more gas than usual.   Spotting of blood in your stool or on the toilet paper  .  IF YOU HAD POLYPS REMOVED DURING THE COLONOSCOPY:  Eat a soft diet IF YOU HAVE NAUSEA, BLOATING, ABDOMINAL PAIN, OR VOMITING.    FINDING OUT THE RESULTS OF YOUR TEST Not all test results are available during your visit. DR. Oneida Alar WILL CALL YOU WITHIN 14 DAYS OF YOUR PROCEDUE WITH YOUR RESULTS. Do not assume everything is normal if you have not heard from DR. Brason Berthelot, CALL HER OFFICE AT [phone].  SEEK IMMEDIATE MEDICAL ATTENTION AND CALL THE OFFICE: [phone] IF:  You have more than a spotting of blood in your stool.   Your belly is swollen (abdominal distention).   You are nauseated or vomiting.   You have a temperature over 101F.   You have abdominal pain or discomfort that is severe or gets worse throughout the day.  Polyps, Colon  A polyp is extra tissue that grows inside your body. Colon polyps grow in the large intestine. The large intestine, also called the colon, is part of your digestive system. It is a long, hollow tube at the end of your digestive tract where your body makes and stores stool. Most polyps are not dangerous. They are benign. This means they are not cancerous. But over time, some types of polyps can turn into cancer. Polyps that are  smaller than a pea are usually not harmful. But larger polyps could someday become or may already be cancerous. To be safe, doctors remove all polyps and test them.   PREVENTION There is not one sure way to prevent polyps. You might be able to lower your risk of getting them if you:  Eat more fruits and vegetables and less fatty food.   Do not smoke.   Avoid alcohol.   Exercise every day.   Lose weight if you are overweight.   Eating more calcium and folate can also lower your risk of getting polyps. Some foods that are rich in calcium are milk, cheese, and broccoli. Some foods that are rich in  folate are chickpeas, kidney beans, and spinach.   High-Fiber Diet A high-fiber diet changes your normal diet to include more whole grains, legumes, fruits, and vegetables. Changes in the diet involve replacing refined carbohydrates with unrefined foods. The calorie level of the diet is essentially unchanged. The Dietary Reference Intake (recommended amount) for adult males is 38 grams per day. For adult females, it is 25 grams per day. Pregnant and lactating women should consume 28 grams of fiber per day. Fiber is the intact part of a plant that is not broken down during digestion. Functional fiber is fiber that has been isolated from the plant to provide a beneficial effect in the body. PURPOSE  Increase stool bulk.   Ease and regulate bowel movements.   Lower cholesterol.   REDUCE RISK OF COLON CANCER  INDICATIONS THAT YOU NEED MORE FIBER  Constipation and hemorrhoids.   Uncomplicated diverticulosis (intestine condition) and irritable bowel syndrome.   Weight management.   As a protective measure against hardening of the arteries (atherosclerosis), diabetes, and cancer.   GUIDELINES FOR INCREASING FIBER IN THE DIET  Start adding fiber to the diet slowly. A gradual increase of about 5 more grams (2 slices of whole-wheat bread, 2 servings of most fruits or vegetables, or 1 bowl of high-fiber cereal) per day is best. Too rapid an increase in fiber may result in constipation, flatulence, and bloating.   Drink enough water and fluids to keep your urine clear or pale yellow. Water, juice, or caffeine-free drinks are recommended. Not drinking enough fluid may cause constipation.   Eat a variety of high-fiber foods rather than one type of fiber.   Try to increase your intake of fiber through using high-fiber foods rather than fiber pills or supplements that contain small amounts of fiber.   The goal is to change the types of food eaten. Do not supplement your present diet with  high-fiber foods, but replace foods in your present diet.   INCLUDE A VARIETY OF FIBER SOURCES  Replace refined and processed grains with whole grains, canned fruits with fresh fruits, and incorporate other fiber sources. White rice, white breads, and most bakery goods contain little or no fiber.   Brown whole-grain rice, buckwheat oats, and many fruits and vegetables are all good sources of fiber. These include: broccoli, Brussels sprouts, cabbage, cauliflower, beets, sweet potatoes, white potatoes (skin on), carrots, tomatoes, eggplant, squash, berries, fresh fruits, and dried fruits.   Cereals appear to be the richest source of fiber. Cereal fiber is found in whole grains and bran. Bran is the fiber-rich outer coat of cereal grain, which is largely removed in refining. In whole-grain cereals, the bran remains. In breakfast cereals, the largest amount of fiber is found in those with "bran" in their names. The fiber content is  sometimes indicated on the label.   You may need to include additional fruits and vegetables each day.   In baking, for 1 cup white flour, you may use the following substitutions:   1 cup whole-wheat flour minus 2 tablespoons.   1/2 cup white flour plus 1/2 cup whole-wheat flour.   Diverticulosis Diverticulosis is a common condition that develops when small pouches (diverticula) form in the wall of the colon. The risk of diverticulosis increases with age. It happens more often in people who eat a low-fiber diet. Most individuals with diverticulosis have no symptoms. Those individuals with symptoms usually experience belly (abdominal) pain, constipation, or loose stools (diarrhea).  HOME CARE INSTRUCTIONS  Increase the amount of fiber in your diet as directed by your caregiver or dietician. This may reduce symptoms of diverticulosis.   Drink at least 6 to 8 glasses of water each day to prevent constipation.   Try not to strain when you have a bowel movement.    Avoiding nuts and seeds to prevent complications is NOT NECESSARY.   FOODS HAVING HIGH FIBER CONTENT INCLUDE:  Fruits. Apple, peach, pear, tangerine, raisins, prunes.   Vegetables. Brussels sprouts, asparagus, broccoli, cabbage, carrot, cauliflower, romaine lettuce, spinach, summer squash, tomato, winter squash, zucchini.   Starchy Vegetables. Baked beans, kidney beans, lima beans, split peas, lentils, potatoes (with skin).   Grains. Whole wheat bread, brown rice, bran flake cereal, plain oatmeal, white rice, shredded wheat, bran muffins.   SEEK IMMEDIATE MEDICAL CARE IF:  You develop increasing pain or severe bloating.   You have an oral temperature above 101F.   You develop vomiting or bowel movements that are bloody or black.   Hemorrhoids Hemorrhoids are dilated (enlarged) veins around the rectum. Sometimes clots will form in the veins. This makes them swollen and painful. These are called thrombosed hemorrhoids. Causes of hemorrhoids include:  Constipation.   Straining to have a bowel movement.   HEAVY LIFTING  HOME CARE INSTRUCTIONS  Eat a well balanced diet and drink 6 to 8 glasses of water every day to avoid constipation. You may also use a bulk laxative.   Avoid straining to have bowel movements.   Keep anal area dry and clean.   Do not use a donut shaped pillow or sit on the toilet for long periods. This increases blood pooling and pain.   Move your bowels when your body has the urge; this will require less straining and will decrease pain and pressure.

## 2014-12-22 NOTE — H&P (Signed)
Primary Care Physician:  Leonides Grills, MD Primary Gastroenterologist:  Dr. Oneida Alar  Pre-Procedure History & Physical: HPI:  Taylor Bean is a 74 y.o. male here for COLON CANCER SCREENING/lactose intolerance.   Past Medical History  Diagnosis Date  . Hyperlipidemia   . Sleep apnea     uses a cpap-will bring dos  . Arthritis     general  . Depression   . Right rotator cuff tear 12/13/2011  . H/O urinary stone   . Fibromyalgia   . Osteoarthritis of right knee 01/19/2013  . Prostatitis   . Elevated PSA     Past Surgical History  Procedure Laterality Date  . 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 Tonsillectomy    . Leg surgery      fx rt leg as 74 yr old  . Colonoscopy w/ polypectomy      begin  . Rotator cuff repair Right 4/31/13  . Total knee arthroplasty Right 01/19/2013    Procedure: TOTAL KNEE ARTHROPLASTY with right trocanteric bursal injection ;  Surgeon: Johnny Bridge, MD;  Location: [facility];  Service: Orthopedics;  Laterality: Right;    Prior to Admission medications   Medication Sig Start Date End Date Taking? Authorizing Provider  diclofenac (VOLTAREN) 75 MG EC tablet Take 75 mg by mouth 2 (two) times daily.  11/01/14  Yes Historical Provider, MD  escitalopram (LEXAPRO) 10 MG tablet Take 10 mg by mouth daily.    Yes Historical Provider, MD  fluticasone (FLONASE) 50 MCG/ACT nasal spray Place 2 sprays into both nostrils daily.  11/17/14  Yes Historical Provider, MD  Na Sulfate-K Sulfate-Mg Sulf (SUPREP BOWEL PREP) SOLN Take 1 kit by mouth as directed. 12/06/14  Yes Danie Binder, MD  simvastatin (ZOCOR) 20 MG tablet Take 20 mg by mouth at bedtime.     Yes Historical Provider, MD    Allergies as of 12/06/2014 - Review Complete 12/06/2014  Allergen Reaction Noted  . Other  01/19/2013  . Adhesive [tape] Hives, Itching, and Rash 01/06/2013    Family History  Problem Relation Age of Onset  . Heart attack Father   . Colon cancer Neg Hx   . Colon polyps Neg Hx      History   Social History  . Marital Status: Married    Spouse Name: N/A  . Number of Children: N/A  . Years of Education: N/A   Occupational History  . Not on file.   Social History Main Topics  . Smoking status: Former Smoker -- 0.25 packs/day for 10 years    Types: Cigarettes    Quit date: 08/01/1968  . Smokeless tobacco: Not on file  . Alcohol Use: 0.6 oz/week    1 Cans of beer per week  . Drug Use: No  . Sexual Activity: Not on file   Other Topics Concern  . Not on file   Social History Narrative    Review of Systems: See HPI, otherwise negative ROS   Physical Exam: BP 145/69 mmHg  Pulse 71  Temp(Src) 98.1 F (36.7 C) (Oral)  Resp 16  Ht 6' 1"  (1.854 m)  Wt 267 lb (121.11 kg)  BMI 35.23 kg/m2  SpO2 93% General:   Alert,  pleasant and cooperative in NAD Head:  Normocephalic and atraumatic. Neck:  Supple; Lungs:  Clear throughout to auscultation.    Heart:  Regular rate and rhythm. Abdomen:  Soft, nontender and nondistended. Normal bowel sounds, without guarding, and without rebound.  Neurologic:  Alert and  oriented x4;  grossly normal neurologically.  Impression/Plan:     SCREENING  Plan:  1. TCS TODAY

## 2014-12-22 NOTE — Progress Notes (Signed)
REVIEWED-NO ADDITIONAL RECOMMENDATIONS. 

## 2014-12-23 ENCOUNTER — Encounter (HOSPITAL_COMMUNITY): Payer: Self-pay | Admitting: Gastroenterology

## 2015-01-02 ENCOUNTER — Telehealth: Payer: Self-pay | Admitting: Gastroenterology

## 2015-01-02 NOTE — Telephone Encounter (Signed)
Formatting of this note might be different from the original.  Please call pt. He had ONE SERRATED adenoma AMD ONE HYPERPLASTIC POLYP REMOVED.     DRINK LACTAID MILK OR USE LACTASE PILLS IF YOU WANT TO EAT A DAIRY PRODUCTS: CHEESE, ICE CREAM, MILK.    FOLLOW A HIGH FIBER DIET. AVOID ITEMS THAT CAUSE BLOATING.     FOLLOW UP IN 3 MOS E30 DIARRHEA.     Next colonoscopy in 5 years.    Electronically signed by Danie Binder, MD at 01/02/2015  3:48 PM EDT

## 2015-01-02 NOTE — Telephone Encounter (Signed)
Formatting of this note might be different from the original.  OV made and reminder in epic  Electronically signed by Zeb Comfort D at 01/02/2015  3:56 PM EDT

## 2015-01-02 NOTE — Telephone Encounter (Signed)
OV made and reminder in epic °

## 2015-01-02 NOTE — Telephone Encounter (Signed)
Please call pt. He had ONE SERRATED adenoma AMD ONE HYPERPLASTIC POLYP REMOVED.    DRINK LACTAID MILK OR USE LACTASE PILLS IF YOU WANT TO EAT A DAIRY PRODUCTS: CHEESE, ICE CREAM, MILK.  FOLLOW A HIGH FIBER DIET. AVOID ITEMS THAT CAUSE BLOATING.   FOLLOW UP IN 3 MOS E30 DIARRHEA.   Next colonoscopy in 5 years.

## 2015-01-03 NOTE — Telephone Encounter (Signed)
Formatting of this note might be different from the original.  Pt is aware of results  Electronically signed by Lurline Gironda, Ginger L at 01/03/2015 11:09 AM EDT

## 2015-01-03 NOTE — Telephone Encounter (Signed)
Formatting of this note might be different from the original.  LM for pt to return call.   Electronically signed by Everardo All, LPN at 89/16/9450 38:88 AM EDT

## 2015-01-03 NOTE — Telephone Encounter (Signed)
LM for pt to returncall

## 2015-01-03 NOTE — Telephone Encounter (Signed)
Pt is aware of results. 

## 2015-03-17 DIAGNOSIS — R972 Elevated prostate specific antigen [PSA]: Secondary | ICD-10-CM | POA: Diagnosis not present

## 2015-03-21 ENCOUNTER — Ambulatory Visit (INDEPENDENT_AMBULATORY_CARE_PROVIDER_SITE_OTHER): Payer: Commercial Managed Care - HMO | Admitting: Urology

## 2015-03-21 DIAGNOSIS — N5201 Erectile dysfunction due to arterial insufficiency: Secondary | ICD-10-CM

## 2015-03-21 DIAGNOSIS — R972 Elevated prostate specific antigen [PSA]: Secondary | ICD-10-CM

## 2015-03-21 DIAGNOSIS — N400 Enlarged prostate without lower urinary tract symptoms: Secondary | ICD-10-CM | POA: Diagnosis not present

## 2015-03-27 ENCOUNTER — Ambulatory Visit: Payer: Commercial Managed Care - HMO | Admitting: Nurse Practitioner

## 2015-03-27 ENCOUNTER — Encounter: Payer: Self-pay | Admitting: Nurse Practitioner

## 2015-03-27 ENCOUNTER — Telehealth: Payer: Self-pay | Admitting: Nurse Practitioner

## 2015-03-27 NOTE — Telephone Encounter (Signed)
Noted  

## 2015-03-27 NOTE — Telephone Encounter (Signed)
PATIENT WAS A NO SHOW AND LETTER SENT  °

## 2015-03-31 DIAGNOSIS — Z6835 Body mass index (BMI) 35.0-35.9, adult: Secondary | ICD-10-CM | POA: Diagnosis not present

## 2015-03-31 DIAGNOSIS — Z1389 Encounter for screening for other disorder: Secondary | ICD-10-CM | POA: Diagnosis not present

## 2015-03-31 DIAGNOSIS — L309 Dermatitis, unspecified: Secondary | ICD-10-CM | POA: Diagnosis not present

## 2015-03-31 DIAGNOSIS — E6609 Other obesity due to excess calories: Secondary | ICD-10-CM | POA: Diagnosis not present

## 2015-03-31 DIAGNOSIS — L918 Other hypertrophic disorders of the skin: Secondary | ICD-10-CM | POA: Diagnosis not present

## 2015-03-31 DIAGNOSIS — J019 Acute sinusitis, unspecified: Secondary | ICD-10-CM | POA: Diagnosis not present

## 2015-05-08 DIAGNOSIS — Z23 Encounter for immunization: Secondary | ICD-10-CM | POA: Diagnosis not present

## 2015-05-16 DIAGNOSIS — Z1283 Encounter for screening for malignant neoplasm of skin: Secondary | ICD-10-CM | POA: Diagnosis not present

## 2015-05-16 DIAGNOSIS — Z6835 Body mass index (BMI) 35.0-35.9, adult: Secondary | ICD-10-CM | POA: Diagnosis not present

## 2015-05-16 DIAGNOSIS — E6609 Other obesity due to excess calories: Secondary | ICD-10-CM | POA: Diagnosis not present

## 2015-05-16 DIAGNOSIS — Z1389 Encounter for screening for other disorder: Secondary | ICD-10-CM | POA: Diagnosis not present

## 2015-05-16 DIAGNOSIS — L570 Actinic keratosis: Secondary | ICD-10-CM | POA: Diagnosis not present

## 2015-05-16 DIAGNOSIS — X32XXXD Exposure to sunlight, subsequent encounter: Secondary | ICD-10-CM | POA: Diagnosis not present

## 2015-05-16 DIAGNOSIS — L299 Pruritus, unspecified: Secondary | ICD-10-CM | POA: Diagnosis not present

## 2015-05-16 DIAGNOSIS — L700 Acne vulgaris: Secondary | ICD-10-CM | POA: Diagnosis not present

## 2015-05-16 DIAGNOSIS — M179 Osteoarthritis of knee, unspecified: Secondary | ICD-10-CM | POA: Diagnosis not present

## 2015-05-17 DIAGNOSIS — M1711 Unilateral primary osteoarthritis, right knee: Secondary | ICD-10-CM | POA: Diagnosis not present

## 2015-06-06 DIAGNOSIS — E6609 Other obesity due to excess calories: Secondary | ICD-10-CM | POA: Diagnosis not present

## 2015-06-06 DIAGNOSIS — Z6835 Body mass index (BMI) 35.0-35.9, adult: Secondary | ICD-10-CM | POA: Diagnosis not present

## 2015-06-06 DIAGNOSIS — J329 Chronic sinusitis, unspecified: Secondary | ICD-10-CM | POA: Diagnosis not present

## 2015-06-06 DIAGNOSIS — Z1389 Encounter for screening for other disorder: Secondary | ICD-10-CM | POA: Diagnosis not present

## 2015-06-06 DIAGNOSIS — J310 Chronic rhinitis: Secondary | ICD-10-CM | POA: Diagnosis not present

## 2015-06-12 DIAGNOSIS — G4733 Obstructive sleep apnea (adult) (pediatric): Secondary | ICD-10-CM | POA: Diagnosis not present

## 2015-06-19 ENCOUNTER — Other Ambulatory Visit (HOSPITAL_COMMUNITY): Payer: Self-pay | Admitting: Family Medicine

## 2015-06-19 ENCOUNTER — Ambulatory Visit (HOSPITAL_COMMUNITY)
Admission: RE | Admit: 2015-06-19 | Discharge: 2015-06-19 | Disposition: A | Discharge status: Home / Self Care | Payer: Commercial Managed Care - HMO | Source: Ambulatory Visit | Attending: Family Medicine | Admitting: Family Medicine

## 2015-06-19 DIAGNOSIS — R059 Cough, unspecified: Secondary | ICD-10-CM

## 2015-06-19 DIAGNOSIS — J449 Chronic obstructive pulmonary disease, unspecified: Secondary | ICD-10-CM | POA: Diagnosis not present

## 2015-06-19 DIAGNOSIS — R05 Cough: Secondary | ICD-10-CM | POA: Diagnosis not present

## 2015-06-19 DIAGNOSIS — G4730 Sleep apnea, unspecified: Secondary | ICD-10-CM | POA: Diagnosis not present

## 2015-06-19 DIAGNOSIS — Z6835 Body mass index (BMI) 35.0-35.9, adult: Secondary | ICD-10-CM | POA: Diagnosis not present

## 2015-06-19 DIAGNOSIS — J309 Allergic rhinitis, unspecified: Secondary | ICD-10-CM | POA: Diagnosis not present

## 2015-06-19 DIAGNOSIS — E6609 Other obesity due to excess calories: Secondary | ICD-10-CM | POA: Diagnosis not present

## 2015-06-30 DIAGNOSIS — G4733 Obstructive sleep apnea (adult) (pediatric): Secondary | ICD-10-CM | POA: Diagnosis not present

## 2015-07-05 DIAGNOSIS — M1711 Unilateral primary osteoarthritis, right knee: Secondary | ICD-10-CM | POA: Diagnosis not present

## 2015-07-13 ENCOUNTER — Ambulatory Visit: Payer: Commercial Managed Care - HMO | Admitting: Cardiology

## 2015-07-21 ENCOUNTER — Ambulatory Visit: Payer: Commercial Managed Care - HMO | Admitting: Cardiology

## 2015-07-21 DIAGNOSIS — Z6835 Body mass index (BMI) 35.0-35.9, adult: Secondary | ICD-10-CM | POA: Diagnosis not present

## 2015-07-21 DIAGNOSIS — J019 Acute sinusitis, unspecified: Secondary | ICD-10-CM | POA: Diagnosis not present

## 2015-07-21 DIAGNOSIS — Z1389 Encounter for screening for other disorder: Secondary | ICD-10-CM | POA: Diagnosis not present

## 2015-07-21 DIAGNOSIS — J209 Acute bronchitis, unspecified: Secondary | ICD-10-CM | POA: Diagnosis not present

## 2015-07-21 DIAGNOSIS — E6609 Other obesity due to excess calories: Secondary | ICD-10-CM | POA: Diagnosis not present

## 2015-07-26 ENCOUNTER — Ambulatory Visit: Payer: Commercial Managed Care - HMO | Admitting: Cardiology

## 2015-08-08 ENCOUNTER — Telehealth: Payer: Self-pay | Admitting: Cardiovascular Disease

## 2015-08-08 DIAGNOSIS — R0602 Shortness of breath: Secondary | ICD-10-CM | POA: Diagnosis not present

## 2015-08-08 DIAGNOSIS — Z6835 Body mass index (BMI) 35.0-35.9, adult: Secondary | ICD-10-CM | POA: Diagnosis not present

## 2015-08-08 DIAGNOSIS — J449 Chronic obstructive pulmonary disease, unspecified: Secondary | ICD-10-CM | POA: Diagnosis not present

## 2015-08-08 DIAGNOSIS — Z1389 Encounter for screening for other disorder: Secondary | ICD-10-CM | POA: Diagnosis not present

## 2015-08-08 NOTE — Telephone Encounter (Signed)
Received records from [facility] for appointment on 10/17/15 with Dr Claiborne Billings.  Records given to [facility] (medical records) for Dr Evette Georges schedule on 10/17/15. lp

## 2015-09-05 ENCOUNTER — Other Ambulatory Visit: Payer: Self-pay | Admitting: Urology

## 2015-09-05 DIAGNOSIS — R972 Elevated prostate specific antigen [PSA]: Secondary | ICD-10-CM

## 2015-09-08 ENCOUNTER — Other Ambulatory Visit: Payer: Self-pay | Admitting: Urology

## 2015-09-08 DIAGNOSIS — R972 Elevated prostate specific antigen [PSA]: Secondary | ICD-10-CM

## 2015-09-13 DIAGNOSIS — Z6835 Body mass index (BMI) 35.0-35.9, adult: Secondary | ICD-10-CM | POA: Diagnosis not present

## 2015-09-13 DIAGNOSIS — Z1389 Encounter for screening for other disorder: Secondary | ICD-10-CM | POA: Diagnosis not present

## 2015-09-13 DIAGNOSIS — R05 Cough: Secondary | ICD-10-CM | POA: Diagnosis not present

## 2015-09-13 DIAGNOSIS — E6609 Other obesity due to excess calories: Secondary | ICD-10-CM | POA: Diagnosis not present

## 2015-09-14 DIAGNOSIS — R972 Elevated prostate specific antigen [PSA]: Secondary | ICD-10-CM | POA: Diagnosis not present

## 2015-09-19 ENCOUNTER — Ambulatory Visit (HOSPITAL_COMMUNITY)
Admission: RE | Admit: 2015-09-19 | Discharge: 2015-09-19 | Disposition: A | Discharge status: Home / Self Care | Payer: Commercial Managed Care - HMO | Source: Ambulatory Visit | Attending: Urology | Admitting: Urology

## 2015-09-19 ENCOUNTER — Ambulatory Visit (HOSPITAL_COMMUNITY): Admission: RE | Admit: 2015-09-19 | Payer: Commercial Managed Care - HMO | Source: Ambulatory Visit

## 2015-09-19 ENCOUNTER — Ambulatory Visit (HOSPITAL_COMMUNITY): Payer: Commercial Managed Care - HMO

## 2015-09-19 DIAGNOSIS — R972 Elevated prostate specific antigen [PSA]: Secondary | ICD-10-CM | POA: Insufficient documentation

## 2015-09-19 DIAGNOSIS — C61 Malignant neoplasm of prostate: Secondary | ICD-10-CM | POA: Insufficient documentation

## 2015-09-19 DIAGNOSIS — N4231 Prostatic intraepithelial neoplasia: Secondary | ICD-10-CM | POA: Insufficient documentation

## 2015-09-19 MED ORDER — LIDOCAINE HCL (PF) 2 % IJ SOLN
10.0000 mL | Freq: Once | INTRAMUSCULAR | Status: AC
  Administered 2015-09-19: 10 mL

## 2015-09-19 MED ORDER — GENTAMICIN SULFATE 40 MG/ML IJ SOLN
INTRAMUSCULAR | Status: AC
  Administered 2015-09-19: 160 mg via INTRAMUSCULAR
  Filled 2015-09-19: qty 4

## 2015-09-19 MED ORDER — LIDOCAINE HCL (PF) 2 % IJ SOLN
INTRAMUSCULAR | Status: AC
  Administered 2015-09-19: 10 mL
  Filled 2015-09-19: qty 10

## 2015-09-19 MED ORDER — GENTAMICIN SULFATE 40 MG/ML IJ SOLN
160.0000 mg | Freq: Once | INTRAMUSCULAR | Status: AC
  Administered 2015-09-19: 160 mg via INTRAMUSCULAR

## 2015-09-19 NOTE — Discharge Instructions (Signed)
Transrectal Ultrasound-Guided Biopsy °A transrectal ultrasound-guided biopsy is a procedure to take samples of tissue from your prostate. Ultrasound images are used to guide the procedure. It is usually done to check the prostate gland for cancer. °BEFORE THE PROCEDURE °· Do not eat or drink after midnight on the night before your procedure. °· Take medicines as your doctor tells you. °· Your doctor may have you stop taking some medicines 5-7 days before the procedure. °· You will be given an enema before your procedure. During an enema, a liquid is put into your butt (rectum) to clear out waste. °· You may have lab tests the day of your procedure. °· Make plans to have someone drive you home. °PROCEDURE °· You will be given medicine to help you relax before the procedure. An IV tube will be put into one of your veins. It will be used to give fluids and medicine. °· You will be given medicine to reduce the risk of infection (antibiotic). °· You will be placed on your side. °· A probe with gel will be put in your butt. This is used to take pictures of your prostate and the area around it. °· A medicine to numb the area is put into your prostate. °· A biopsy needle is then inserted and guided to your prostate. °· Samples of prostate tissue are taken. The needle is removed. °· The samples are sent to a lab to be checked. Results are usually back in 2-3 days. °AFTER THE PROCEDURE °· You will be taken to a room where you will be watched until you are doing okay. °· You may have some pain in the area around your butt. You will be given medicines for this. °· You may be able to go home the same day. Sometimes, an overnight stay in the hospital is needed. °  °This information is not intended to replace advice given to you by your health care provider. Make sure you discuss any questions you have with your health care provider. °  °Document Released: 06/19/2009 Document Revised: 07/06/2013 Document Reviewed:  02/17/2013 °Elsevier Interactive Patient Education ©2016 [employer] ° °

## 2015-10-17 ENCOUNTER — Encounter: Payer: Self-pay | Admitting: Cardiovascular Disease

## 2015-10-17 ENCOUNTER — Ambulatory Visit (INDEPENDENT_AMBULATORY_CARE_PROVIDER_SITE_OTHER): Payer: Commercial Managed Care - HMO | Admitting: Cardiovascular Disease

## 2015-10-17 VITALS — BP 124/60 | HR 64 | Ht 73.5 in | Wt 270.0 lb

## 2015-10-17 DIAGNOSIS — E785 Hyperlipidemia, unspecified: Secondary | ICD-10-CM

## 2015-10-17 DIAGNOSIS — R0602 Shortness of breath: Secondary | ICD-10-CM

## 2015-10-17 NOTE — Patient Instructions (Signed)
Your physician recommends that you schedule a follow-up appointment in: June / July sleep clinic.  We will send order to [employer] for a new machine.

## 2015-10-19 ENCOUNTER — Encounter: Payer: Self-pay | Admitting: Cardiovascular Disease

## 2015-10-19 ENCOUNTER — Telehealth: Payer: Self-pay | Admitting: *Deleted

## 2015-10-19 NOTE — Progress Notes (Signed)
Patient ID: Taylor Bean, male   DOB: 1940-08-28, 75 y.o.   MRN: [id number]     Primary MD: Dr.:Koberlein  PATIENT PROFILE: Taylor Bean is a 75 y.o. male who presents for cardiology and sleep evaluation.  I have not seen him since March 2011 as part of the [facility].   HPI:  Taylor Bean was initially seen by me in 2011 when he was referred for significant snoring, progressive, daytime sleepiness, and fragmented sleep. A sleep study at the [facility] on 06/14/2009 revealed very severe sleep apnea.  His sleep efficiency was 81.29%.  His AHI was 90.48 per hour.  He was unable to have any rim sleep.  There was significant oxygen desaturation to a nadir of 71% with non-REM sleep.  There was loud snoring.  25.  Periodic limb movements were observed with an index of 4.4 per hour with 3.5 per hour to arousal.  He was titrated with CPAP therapy and was prescribed 11 cm pressure.  With CPAP therapy, he feels that this has "changed my life.".  A download from the summer 29 2010 through 08/10/2009 showed an apnea index of 0.6 with an AHI of 9.5, predominantly due to hypopnea's.  He has been on CPAP therapy and has been using it with 100% compliance ever since.He has a Building surveyor 2.  His DME company has been [place].  Recently, his machine has been malfunctioning and has been intermittently cutting off.  He cannot sleep without CPAP and is in need for new machine.  He presents for evaluation.  Additional history is notable for arthritic issues involving his right shoulder, right knee, and right hip which have undergone replacements.  He has noticed some mild shortness of breath with activity but denies any chest pain.  He denies any palpitations.  He is status post a recent prostate biopsy by Dr.Dalstead.  He had a routine treadmill test in 2012 which was normal.  He has a history of hyper lipidemia, and has been on simvastatin 20 mg daily.  Review of his medical  chart reveals that he has seen Dr. Harl Bowie in 2005 for shortness of breath.  He had normal LV function on echocardiography with grade 1 diastolic dysfunction.   Past Medical History  Diagnosis Date  . Hyperlipidemia   . Sleep apnea     uses a cpap-will bring dos  . Arthritis     general  . Depression   . Right rotator cuff tear 12/13/2011  . H/O urinary stone   . Fibromyalgia   . Osteoarthritis of right knee 01/19/2013  . Prostatitis   . Elevated PSA     Past Surgical History  Procedure Laterality Date  . 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 Tonsillectomy    . Leg surgery      fx rt leg as 75 yr old  . Colonoscopy w/ polypectomy      begin  . Rotator cuff repair Right 4/31/13  . Total knee arthroplasty Right 01/19/2013    Procedure: TOTAL KNEE ARTHROPLASTY with right trocanteric bursal injection ;  Surgeon: Johnny Bridge, MD;  Location: [facility];  Service: Orthopedics;  Laterality: Right;  . Colonoscopy N/A 12/22/2014    SLF: 1. two colon polyps removed 2. Redundant left colon/ moderate diverticulosis in the left colon 3. small internal hemorrhoids    Allergies  Allergen Reactions  . Other     Standard Epoxy  resin used for joint replacement   . Adhesive [Tape] Hives, Itching and Rash    Current Outpatient Prescriptions  Medication Sig Dispense Refill  . diclofenac (VOLTAREN) 75 MG EC tablet Take 75 mg by mouth 2 (two) times daily.     Marland Kitchen escitalopram (LEXAPRO) 10 MG tablet Take 10 mg by mouth daily.     . simvastatin (ZOCOR) 20 MG tablet Take 20 mg by mouth at bedtime.       No current facility-administered medications for this visit.    Social History   Social History  . Marital Status: Married    Spouse Name: N/A  . Number of Children: N/A  . Years of Education: N/A   Occupational History  . Not on file.   Social History Main Topics  . Smoking status: Former Smoker -- 0.25 packs/day for 10 years    Types: Cigarettes    Quit date: 08/01/1968  . Smokeless  tobacco: Not on file  . Alcohol Use: 0.6 oz/week    1 Cans of beer per week  . Drug Use: No  . Sexual Activity: Not on file   Other Topics Concern  . Not on file   Social History Narrative   Additional social history is notable in that he was born in [place] and was raised in [place], [place].  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  He subsequent, he worked for 14 years at [employer].  He is now fully retired.  Family History  Problem Relation Age of Onset  . Heart attack Father   . Colon cancer Neg Hx   . Colon polyps Neg Hx    Family history is notable that both parents are deceased.  His mother died at 44 and father died at 14.  One brother is deceased who had asbestosis and mesothelioma.  He is married for 47 years.  Has a daughter age 91.  A son died at age 91.  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  ROS General: Negative; No fevers, chills, or night sweats HEENT: Negative; No changes in vision or hearing, sinus congestion, difficulty swallowing Pulmonary: Negative; No cough, wheezing, shortness of breath, hemoptysis Cardiovascular:  See HPI; No chest pain, presyncope, syncope, palpitations, edema GI: Negative; No nausea, vomiting, diarrhea, or abdominal pain GU: Negative; No dysuria, hematuria, or difficulty voiding Musculoskeletal: Negative; no myalgias, joint pain, or weakness Hematologic/Oncologic: Negative; no easy bruising, bleeding Endocrine: Negative; no heat/cold intolerance; no diabetes Neuro: Negative; no changes in balance, headaches Skin: Negative; No rashes or skin lesions Psychiatric: Negative; No behavioral problems, depression Sleep: Negative; No daytime sleepiness, hypersomnolence, bruxism, restless legs, hypnogagnic hallucinations Other comprehensive 14 point system review is negative   Physical Exam BP 124/60 mmHg  Pulse 64  Ht 6' 1.5" (1.867 m)  Wt 270 lb (122.471  kg)  BMI 35.14 kg/m2  Wt Readings from Last 3 Encounters:  10/17/15 270 lb (122.471 kg)  12/22/14 267 lb (121.11 kg)  12/06/14 267 lb 6.4 oz (121.292 kg)   General: Alert, oriented, no distress.  Skin: normal turgor, no rashes, warm and dry HEENT: Normocephalic, atraumatic. Pupils equal round and reactive to light; sclera anicteric; extraocular muscles intact; Fundi ** Nose without nasal septal hypertrophy Mouth/Parynx benign; Mallinpatti scale 3/4 Neck: No JVD, no carotid bruits; normal carotid upstroke Lungs: clear to ausculatation and percussion; no wheezing or rales Chest wall: without tenderness to palpitation Heart: PMI not displaced, RRR, s1 s2 normal,  1/6 systolic murmur, no diastolic murmur, no rubs, gallops, thrills, or heaves Abdomen: soft, nontender; no hepatosplenomehaly, BS+; abdominal aorta nontender and not dilated by palpation. Back: no CVA tenderness Pulses 2+ Musculoskeletal: full range of motion, normal strength, no joint deformities Extremities: no clubbing cyanosis or edema, Homan's sign negative  Neurologic: grossly nonfocal; Cranial nerves grossly wnl Psychologic: Normal mood and affect   ECG (independently read by me): Normal sinus rhythm at 64 bpm.  No ectopy.  LABS:  BMP Latest Ref Rng 01/20/2013 01/19/2013 01/19/2013  Glucose 70 - 99 mg/dL 139(H) - 115(H)  BUN 6 - 23 mg/dL 11 - 16  Creatinine 0.50 - 1.35 mg/dL 0.86 0.86 0.92  Sodium 135 - 145 mEq/L 137 - 142  Potassium 3.5 - 5.1 mEq/L 4.2 - 4.2  Chloride 96 - 112 mEq/L 99 - 104  CO2 19 - 32 mEq/L 30 - 29  Calcium 8.4 - 10.5 mg/dL 9.0 - 10.4     No flowsheet data found.  CBC Latest Ref Rng 01/21/2013 01/20/2013 01/19/2013  WBC 4.0 - 10.5 K/uL 9.1 9.5 11.6(H)  Hemoglobin 13.0 - 17.0 g/dL 12.5(L) 13.2 13.7  Hematocrit 39.0 - 52.0 % 37.1(L) 39.5 40.8  Platelets 150 - 400 K/uL 197 230 210   Lab Results  Component Value Date   MCV 91.4 01/21/2013   MCV 91.6 01/20/2013   MCV 90.7 01/19/2013   No  results found for: TSH No results found for: HGBA1C   BNP No results found for: BNP  ProBNP No results found for: PROBNP   Lipid Panel  No results found for: CHOL, TRIG, HDL, CHOLHDL, VLDL, LDLCALC, LDLDIRECT  RADIOLOGY: No results found.   ASSESSMENT AND PLAN: [name] is a 75 year old male who was diagnosed as having severe obstructive sleep apnea in December 2010 and at that time his AHI was 90.48 per hour.  There was significant oxygen desaturation to 71% and on the baseline portion of the study.  He was unable to achieve REM sleep.  He admits to 100% compliance and cannot sleep without CPAP.  Recently, his old ResMed Elite 2 CPAP machine has been malfunctioning.  It is over 15 years old.  He qualifies for new machine and I'm writing her a prescription for a ResMed AirSense 10 unit.  I would initially recommend an auto mode such that adjustment to his CPAP pressure can be made depending upon his initial downloads.  As long as he continues to use his CPAP therapy, he denies any residual daytime sleepiness or awareness of breakthrough snoring.  He denies any nocturnal palpitations.  His blood pressure today is stable at 124/60 and pulse is regular.  He also has a history of hyperlipidemia and has been on simvastatin 20 g daily.  He has not had recent laboratory.  He tells me his primary care physician will be leaving.  He is status post right hip, knee and shoulder replacement.  He is not having any chest pain symptoms.  Following obtaining his new machine, I will see him back in the office in a sleep clinic per Medicare requirements.   Troy Sine, MD, [facility] 10/19/2015 2:18 PM

## 2015-10-19 NOTE — Telephone Encounter (Signed)
Faxed order along with sleep studies, OV note, and demographics to [facility] for patient to receive a new CPAP machine.

## 2015-10-24 DIAGNOSIS — M1380 Other specified arthritis, unspecified site: Secondary | ICD-10-CM | POA: Diagnosis not present

## 2015-10-24 DIAGNOSIS — Z0100 Encounter for examination of eyes and vision without abnormal findings: Secondary | ICD-10-CM | POA: Diagnosis not present

## 2015-10-24 DIAGNOSIS — G4733 Obstructive sleep apnea (adult) (pediatric): Secondary | ICD-10-CM | POA: Diagnosis not present

## 2015-10-24 DIAGNOSIS — H5210 Myopia, unspecified eye: Secondary | ICD-10-CM | POA: Diagnosis not present

## 2015-10-24 DIAGNOSIS — E785 Hyperlipidemia, unspecified: Secondary | ICD-10-CM | POA: Diagnosis not present

## 2015-10-24 DIAGNOSIS — H52223 Regular astigmatism, bilateral: Secondary | ICD-10-CM | POA: Diagnosis not present

## 2015-11-03 DIAGNOSIS — R7309 Other abnormal glucose: Secondary | ICD-10-CM | POA: Diagnosis not present

## 2015-11-03 DIAGNOSIS — Z6835 Body mass index (BMI) 35.0-35.9, adult: Secondary | ICD-10-CM | POA: Diagnosis not present

## 2015-11-03 DIAGNOSIS — G250 Essential tremor: Secondary | ICD-10-CM | POA: Diagnosis not present

## 2015-11-03 DIAGNOSIS — R0602 Shortness of breath: Secondary | ICD-10-CM | POA: Diagnosis not present

## 2015-11-03 DIAGNOSIS — Z1389 Encounter for screening for other disorder: Secondary | ICD-10-CM | POA: Diagnosis not present

## 2015-11-06 DIAGNOSIS — R5383 Other fatigue: Secondary | ICD-10-CM | POA: Diagnosis not present

## 2015-11-23 DIAGNOSIS — G4733 Obstructive sleep apnea (adult) (pediatric): Secondary | ICD-10-CM | POA: Diagnosis not present

## 2015-11-23 DIAGNOSIS — E785 Hyperlipidemia, unspecified: Secondary | ICD-10-CM | POA: Diagnosis not present

## 2015-11-23 DIAGNOSIS — M1380 Other specified arthritis, unspecified site: Secondary | ICD-10-CM | POA: Diagnosis not present

## 2015-11-24 ENCOUNTER — Ambulatory Visit (HOSPITAL_COMMUNITY)
Admission: RE | Admit: 2015-11-24 | Discharge: 2015-11-24 | Disposition: A | Discharge status: Home / Self Care | Payer: Commercial Managed Care - HMO | Source: Ambulatory Visit | Attending: Physician Assistant | Admitting: Physician Assistant

## 2015-11-24 ENCOUNTER — Other Ambulatory Visit (HOSPITAL_COMMUNITY): Payer: Self-pay | Admitting: Physician Assistant

## 2015-11-24 DIAGNOSIS — R059 Cough, unspecified: Secondary | ICD-10-CM

## 2015-11-24 DIAGNOSIS — R05 Cough: Secondary | ICD-10-CM

## 2015-11-24 DIAGNOSIS — Z1389 Encounter for screening for other disorder: Secondary | ICD-10-CM | POA: Diagnosis not present

## 2015-11-24 DIAGNOSIS — R0602 Shortness of breath: Secondary | ICD-10-CM | POA: Diagnosis not present

## 2015-11-24 DIAGNOSIS — Z6834 Body mass index (BMI) 34.0-34.9, adult: Secondary | ICD-10-CM | POA: Diagnosis not present

## 2015-12-07 ENCOUNTER — Telehealth: Payer: Self-pay | Admitting: Cardiovascular Disease

## 2015-12-07 NOTE — Telephone Encounter (Signed)
New message   Pt is calling for a sleep appt for June or July first avail was Aug

## 2015-12-13 NOTE — Telephone Encounter (Signed)
Pt scheduled for July appt.

## 2015-12-13 NOTE — Telephone Encounter (Signed)
Then he will need to take August. We can make a note to call him if there is a cancellation.

## 2015-12-24 DIAGNOSIS — M1380 Other specified arthritis, unspecified site: Secondary | ICD-10-CM | POA: Diagnosis not present

## 2015-12-24 DIAGNOSIS — G4733 Obstructive sleep apnea (adult) (pediatric): Secondary | ICD-10-CM | POA: Diagnosis not present

## 2015-12-24 DIAGNOSIS — E785 Hyperlipidemia, unspecified: Secondary | ICD-10-CM | POA: Diagnosis not present

## 2016-01-23 DIAGNOSIS — E785 Hyperlipidemia, unspecified: Secondary | ICD-10-CM | POA: Diagnosis not present

## 2016-01-23 DIAGNOSIS — G4733 Obstructive sleep apnea (adult) (pediatric): Secondary | ICD-10-CM | POA: Diagnosis not present

## 2016-01-23 DIAGNOSIS — M1380 Other specified arthritis, unspecified site: Secondary | ICD-10-CM | POA: Diagnosis not present

## 2016-01-24 ENCOUNTER — Ambulatory Visit (INDEPENDENT_AMBULATORY_CARE_PROVIDER_SITE_OTHER): Payer: Commercial Managed Care - HMO | Admitting: Cardiovascular Disease

## 2016-01-24 ENCOUNTER — Encounter: Payer: Self-pay | Admitting: Cardiovascular Disease

## 2016-01-24 VITALS — BP 131/71 | HR 81 | Ht 73.0 in | Wt 259.0 lb

## 2016-01-24 DIAGNOSIS — E785 Hyperlipidemia, unspecified: Secondary | ICD-10-CM | POA: Diagnosis not present

## 2016-01-24 DIAGNOSIS — G4733 Obstructive sleep apnea (adult) (pediatric): Secondary | ICD-10-CM | POA: Diagnosis not present

## 2016-01-24 DIAGNOSIS — Z9989 Dependence on other enabling machines and devices: Principal | ICD-10-CM

## 2016-01-24 NOTE — Patient Instructions (Signed)
Your physician wants you to follow-up in:1 year or sooner if needed with Dr Claiborne Billings in sleep clinic. You will receive a reminder letter in the mail two months in advance. If you don't receive a letter, please call our office to schedule the follow-up appointment.

## 2016-01-25 ENCOUNTER — Encounter: Payer: Self-pay | Admitting: Cardiovascular Disease

## 2016-01-25 DIAGNOSIS — G4733 Obstructive sleep apnea (adult) (pediatric): Secondary | ICD-10-CM | POA: Insufficient documentation

## 2016-01-25 DIAGNOSIS — Z9989 Dependence on other enabling machines and devices: Principal | ICD-10-CM

## 2016-01-25 DIAGNOSIS — E785 Hyperlipidemia, unspecified: Secondary | ICD-10-CM | POA: Insufficient documentation

## 2016-01-25 NOTE — Progress Notes (Signed)
Patient ID: Taylor Bean, male   DOB: 1941-04-09, 75 y.o.   MRN: [id number]     Primary MD: Dr.:Koberlein  PATIENT PROFILE: Taylor Bean is a 75 y.o. male who presents for sleep clinic evaluation following initiation of a new CPAP unit.  HPI:  Taylor Bean was initially seen by me in 2011 when he was referred for significant snoring, progressive, daytime sleepiness, and fragmented sleep. A sleep study at the [facility] on 06/14/2009 revealed very severe sleep apnea.  His sleep efficiency was 81.29%.  His AHI was 90.48 per hour.  He was unable to have any rim sleep.  There was significant oxygen desaturation to a nadir of 71% with non-REM sleep.  There was loud snoring.  25.  Periodic limb movements were observed with an index of 4.4 per hour with 3.5 per hour to arousal.  He was titrated with CPAP therapy and was prescribed 11 cm pressure.  With CPAP therapy, he feels that this has "changed my life.".  A download from the summer 29 2010 through 08/10/2009 showed an apnea index of 0.6 with an AHI of 9.5, predominantly due to hypopnea's.  He has been on CPAP therapy and has been using it with 100% compliance ever since.He has a Building surveyor 2.  His DME company has been [place]. Recently, his machine has been malfunctioning and has been intermittently cutting off.  He presented for evaluation with me in April 2017.  At that time, I prescribed a new CPAP unit and he received a ResMed AirSense10 AutoSet unit at the end of April 2017.  He notes significant proven with this new machine.    A download was obtained from 12/24/2015 through 01/22/2016.  This revealed 100% compliance both with usage stays and usage greater than 4 hours.  He was averaging 9 hours and 38 minutes of sleep per night.  He is set at a minimum pressure of 4 and maximum pressure of 20 cm of water pressure.  His 95th percentile pressure is 14.7 with a maximum of 16.3.  AHI is excellent at 2.4.  There is no mask leak.  An  Epworth Sleepiness Scale scale score was recalculated today and this endorsed at 2 are unit against residual daytime sleepiness.  Additional history is notable for arthritic issues involving his right shoulder, right knee, and right hip which have undergone replacements.  He has noticed some mild shortness of breath with activity but denies any chest pain.  He denies any palpitations.  He is status post a recent prostate biopsy by Dr.Dalstead.  He had a routine treadmill test in 2012 which was normal.  He has a history of hyper lipidemia, and has been on simvastatin 20 mg daily.  Review of his medical chart reveals that he has seen Dr. Harl Bowie in 2005 for shortness of breath.  He had normal LV function on echocardiography with grade 1 diastolic dysfunction.   Past Medical History  Diagnosis Date  . Hyperlipidemia   . Sleep apnea     uses a cpap-will bring dos  . Arthritis     general  . Depression   . Right rotator cuff tear 12/13/2011  . H/O urinary stone   . Fibromyalgia   . Osteoarthritis of right knee 01/19/2013  . Prostatitis   . Elevated PSA     Past Surgical History  Procedure Laterality Date  . 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 Tonsillectomy    .  Leg surgery      fx rt leg as 75 yr old  . Colonoscopy w/ polypectomy      begin  . Rotator cuff repair Right 4/31/13  . Total knee arthroplasty Right 01/19/2013    Procedure: TOTAL KNEE ARTHROPLASTY with right trocanteric bursal injection ;  Surgeon: Johnny Bridge, MD;  Location: [place];  Service: Orthopedics;  Laterality: Right;  . Colonoscopy N/A 12/22/2014    SLF: 1. two colon polyps removed 2. Redundant left colon/ moderate diverticulosis in the left colon 3. small internal hemorrhoids    Allergies  Allergen Reactions  . Other     Standard Epoxy resin used for joint replacement   . Adhesive [Tape] Hives, Itching and Rash    Current Outpatient Prescriptions  Medication Sig Dispense Refill  . diclofenac (VOLTAREN) 75  MG EC tablet Take 75 mg by mouth 2 (two) times daily.     Marland Kitchen escitalopram (LEXAPRO) 10 MG tablet Take 10 mg by mouth daily.     Marland Kitchen omeprazole (PRILOSEC) 40 MG capsule Take 40 mg by mouth daily.  1  . simvastatin (ZOCOR) 20 MG tablet Take 20 mg by mouth at bedtime.       No current facility-administered medications for this visit.    Social History   Social History  . Marital Status: Married    Spouse Name: N/A  . Number of Children: N/A  . Years of Education: N/A   Occupational History  . Not on file.   Social History Main Topics  . Smoking status: Former Smoker -- 0.25 packs/day for 10 years    Types: Cigarettes    Quit date: 08/01/1968  . Smokeless tobacco: Not on file  . Alcohol Use: 0.6 oz/week    1 Cans of beer per week  . Drug Use: No  . Sexual Activity: Not on file   Other Topics Concern  . Not on file   Social History Narrative   Additional social history is notable in that he was born in [place] and was raised in [place], [place].  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  He subsequent, he worked for 14 years at [employer].  He is now fully retired.  Family History  Problem Relation Age of Onset  . Heart attack Father   . Colon cancer Neg Hx   . Colon polyps Neg Hx    Family history is notable that both parents are deceased.  His mother died at 74 and father died at 58.  One brother is deceased who had asbestosis and mesothelioma.  He is married for 47 years.  Has a daughter age 81.  A son died at age 89.  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  ROS General: Negative; No fevers, chills, or night sweats HEENT: Negative; No changes in vision or hearing, sinus congestion, difficulty swallowing Pulmonary: Negative; No cough, wheezing, shortness of breath, hemoptysis Cardiovascular:  See HPI; No chest pain, presyncope, syncope, palpitations, edema GI: Negative; No nausea, vomiting,  diarrhea, or abdominal pain GU: Negative; No dysuria, hematuria, or difficulty voiding Musculoskeletal: Negative; no myalgias, joint pain, or weakness Hematologic/Oncologic: Negative; no easy bruising, bleeding Endocrine: Negative; no heat/cold intolerance; no diabetes Neuro: Negative; no changes in balance, headaches Skin: Negative; No rashes or skin lesions Psychiatric: Negative; No behavioral problems, depression Sleep: Negative; No daytime sleepiness, hypersomnolence, bruxism, restless legs, hypnogagnic hallucinations Other comprehensive 14 point system review is negative  Physical Exam BP 131/71 mmHg  Pulse 81  Ht 6\' 1"  (1.854 m)  Wt 259 lb (117.482 kg)  BMI 34.18 kg/m2  Wt Readings from Last 3 Encounters:  01/24/16 259 lb (117.482 kg)  10/17/15 270 lb (122.471 kg)  12/22/14 267 lb (121.11 kg)   General: Alert, oriented, no distress.  Skin: normal turgor, no rashes, warm and dry HEENT: Normocephalic, atraumatic. Pupils equal round and reactive to light; sclera anicteric; extraocular muscles intact; Fundi ** Nose without nasal septal hypertrophy Mouth/Parynx benign; Mallinpatti scale 3/4 Neck: No JVD, no carotid bruits; normal carotid upstroke Lungs: clear to ausculatation and percussion; no wheezing or rales Chest wall: without tenderness to palpitation Heart: PMI not displaced, RRR, s1 s2 normal, 1/6 systolic murmur, no diastolic murmur, no rubs, gallops, thrills, or heaves Abdomen: soft, nontender; no hepatosplenomehaly, BS+; abdominal aorta nontender and not dilated by palpation. Back: no CVA tenderness Pulses 2+ Musculoskeletal: full range of motion, normal strength, no joint deformities Extremities: no clubbing cyanosis or edema, Homan's sign negative  Neurologic: grossly nonfocal; Cranial nerves grossly wnl Psychologic: Normal mood and affect  No ECG was done today.  April 2017 ECG (independently read by me): Normal sinus rhythm at 64 bpm.  No  ectopy.  LABS:  BMP Latest Ref Rng 01/20/2013 01/19/2013 01/19/2013  Glucose 70 - 99 mg/dL 139(H) - 115(H)  BUN 6 - 23 mg/dL 11 - 16  Creatinine 0.50 - 1.35 mg/dL 0.86 0.86 0.92  Sodium 135 - 145 mEq/L 137 - 142  Potassium 3.5 - 5.1 mEq/L 4.2 - 4.2  Chloride 96 - 112 mEq/L 99 - 104  CO2 19 - 32 mEq/L 30 - 29  Calcium 8.4 - 10.5 mg/dL 9.0 - 10.4     No flowsheet data found.  CBC Latest Ref Rng 01/21/2013 01/20/2013 01/19/2013  WBC 4.0 - 10.5 K/uL 9.1 9.5 11.6(H)  Hemoglobin 13.0 - 17.0 g/dL 12.5(L) 13.2 13.7  Hematocrit 39.0 - 52.0 % 37.1(L) 39.5 40.8  Platelets 150 - 400 K/uL 197 230 210   Lab Results  Component Value Date   MCV 91.4 01/21/2013   MCV 91.6 01/20/2013   MCV 90.7 01/19/2013   No results found for: TSH No results found for: HGBA1C   BNP No results found for: BNP  ProBNP No results found for: PROBNP   Lipid Panel  No results found for: CHOL, TRIG, HDL, CHOLHDL, VLDL, LDLCALC, LDLDIRECT  RADIOLOGY: No results found.   ASSESSMENT AND PLAN: [name] is a 75 year old male who was diagnosed as having severe obstructive sleep apnea in December 2010 and at that time his AHI was 90.48 per hour.  There was significant oxygen desaturation to 71% and on the baseline portion of the study.  He was unable to achieve REM sleep.  He admits to 100% compliance and cannot sleep without CPAP. He had an old ResMed Elite 2 CPAP machine began to malfunction.  He received a replacement unit at the end of April 2017.  His download data was reviewed in detail.  He is meeting compliance standards.  He has excellent sleep duration.  He cannot sleep without CPAP.  He is aware of appropriate maintenance for his equipment.  He continues to be on simvastatin for his hyperlipidemia and is tolerating this.  His blood pressure today is stable.  I will see him in one year for reevaluation.  Troy Sine, MD, [facility] 01/25/2016 6:24 PM

## 2016-01-30 DIAGNOSIS — J45909 Unspecified asthma, uncomplicated: Secondary | ICD-10-CM | POA: Diagnosis not present

## 2016-01-30 DIAGNOSIS — G4733 Obstructive sleep apnea (adult) (pediatric): Secondary | ICD-10-CM | POA: Diagnosis not present

## 2016-01-31 ENCOUNTER — Other Ambulatory Visit (HOSPITAL_COMMUNITY): Payer: Self-pay | Admitting: Respiratory Therapy

## 2016-01-31 DIAGNOSIS — G2579 Other drug induced movement disorders: Secondary | ICD-10-CM | POA: Diagnosis not present

## 2016-01-31 DIAGNOSIS — Z6834 Body mass index (BMI) 34.0-34.9, adult: Secondary | ICD-10-CM | POA: Diagnosis not present

## 2016-01-31 DIAGNOSIS — Z1389 Encounter for screening for other disorder: Secondary | ICD-10-CM | POA: Diagnosis not present

## 2016-01-31 DIAGNOSIS — R946 Abnormal results of thyroid function studies: Secondary | ICD-10-CM | POA: Diagnosis not present

## 2016-01-31 DIAGNOSIS — L299 Pruritus, unspecified: Secondary | ICD-10-CM | POA: Diagnosis not present

## 2016-01-31 DIAGNOSIS — J45909 Unspecified asthma, uncomplicated: Secondary | ICD-10-CM

## 2016-02-07 DIAGNOSIS — Z23 Encounter for immunization: Secondary | ICD-10-CM | POA: Diagnosis not present

## 2016-02-07 DIAGNOSIS — Z1389 Encounter for screening for other disorder: Secondary | ICD-10-CM | POA: Diagnosis not present

## 2016-02-07 DIAGNOSIS — Z6834 Body mass index (BMI) 34.0-34.9, adult: Secondary | ICD-10-CM | POA: Diagnosis not present

## 2016-02-07 DIAGNOSIS — Z0000 Encounter for general adult medical examination without abnormal findings: Secondary | ICD-10-CM | POA: Diagnosis not present

## 2016-02-14 ENCOUNTER — Ambulatory Visit (HOSPITAL_COMMUNITY)
Admission: RE | Admit: 2016-02-14 | Discharge: 2016-02-14 | Disposition: A | Discharge status: Home / Self Care | Payer: Commercial Managed Care - HMO | Source: Ambulatory Visit | Attending: Pulmonary Disease | Admitting: Pulmonary Disease

## 2016-02-14 DIAGNOSIS — J45909 Unspecified asthma, uncomplicated: Secondary | ICD-10-CM | POA: Insufficient documentation

## 2016-02-14 LAB — PULMONARY FUNCTION TEST
DL/VA % pred: 87 %
DL/VA: 4.15 ml/min/mmHg/L
DLCO UNC: 26.11 ml/min/mmHg
DLCO unc % pred: 71 %
FEF 25-75 POST: 4.61 L/s
FEF 25-75 Pre: 3.95 L/sec
FEF2575-%CHANGE-POST: 16 %
FEF2575-%PRED-POST: 178 %
FEF2575-%Pred-Pre: 152 %
FEV1-%Change-Post: 3 %
FEV1-%PRED-PRE: 107 %
FEV1-%Pred-Post: 112 %
FEV1-Post: 3.95 L
FEV1-Pre: 3.8 L
FEV1FVC-%CHANGE-POST: 2 %
FEV1FVC-%PRED-PRE: 110 %
FEV6-%Change-Post: 1 %
FEV6-%Pred-Post: 102 %
FEV6-%Pred-Pre: 101 %
FEV6-Post: 4.67 L
FEV6-Pre: 4.62 L
FEV6FVC-%Change-Post: 0 %
FEV6FVC-%PRED-PRE: 104 %
FEV6FVC-%Pred-Post: 104 %
FVC-%Change-Post: 1 %
FVC-%PRED-PRE: 97 %
FVC-%Pred-Post: 98 %
FVC-POST: 4.76 L
FVC-PRE: 4.71 L
POST FEV1/FVC RATIO: 83 %
Post FEV6/FVC ratio: 98 %
Pre FEV1/FVC ratio: 81 %
Pre FEV6/FVC Ratio: 98 %
RV % PRED: 118 %
RV: 3.19 L
TLC % pred: 104 %
TLC: 7.97 L

## 2016-02-14 MED ORDER — ALBUTEROL SULFATE (2.5 MG/3ML) 0.083% IN NEBU
2.5000 mg | INHALATION_SOLUTION | Freq: Once | RESPIRATORY_TRACT | Status: AC
  Administered 2016-02-14: 2.5 mg via RESPIRATORY_TRACT

## 2016-02-23 DIAGNOSIS — M1380 Other specified arthritis, unspecified site: Secondary | ICD-10-CM | POA: Diagnosis not present

## 2016-02-23 DIAGNOSIS — E785 Hyperlipidemia, unspecified: Secondary | ICD-10-CM | POA: Diagnosis not present

## 2016-02-23 DIAGNOSIS — G4733 Obstructive sleep apnea (adult) (pediatric): Secondary | ICD-10-CM | POA: Diagnosis not present

## 2016-03-12 DIAGNOSIS — J45909 Unspecified asthma, uncomplicated: Secondary | ICD-10-CM | POA: Diagnosis not present

## 2016-03-12 DIAGNOSIS — R0602 Shortness of breath: Secondary | ICD-10-CM | POA: Diagnosis not present

## 2016-03-21 DIAGNOSIS — R972 Elevated prostate specific antigen [PSA]: Secondary | ICD-10-CM | POA: Diagnosis not present

## 2016-03-25 DIAGNOSIS — E785 Hyperlipidemia, unspecified: Secondary | ICD-10-CM | POA: Diagnosis not present

## 2016-03-25 DIAGNOSIS — M1380 Other specified arthritis, unspecified site: Secondary | ICD-10-CM | POA: Diagnosis not present

## 2016-03-25 DIAGNOSIS — G4733 Obstructive sleep apnea (adult) (pediatric): Secondary | ICD-10-CM | POA: Diagnosis not present

## 2016-03-26 ENCOUNTER — Ambulatory Visit (INDEPENDENT_AMBULATORY_CARE_PROVIDER_SITE_OTHER): Payer: Commercial Managed Care - HMO | Admitting: Urology

## 2016-03-26 DIAGNOSIS — C61 Malignant neoplasm of prostate: Secondary | ICD-10-CM | POA: Diagnosis not present

## 2016-04-24 DIAGNOSIS — G4733 Obstructive sleep apnea (adult) (pediatric): Secondary | ICD-10-CM | POA: Diagnosis not present

## 2016-04-24 DIAGNOSIS — M1380 Other specified arthritis, unspecified site: Secondary | ICD-10-CM | POA: Diagnosis not present

## 2016-04-24 DIAGNOSIS — E785 Hyperlipidemia, unspecified: Secondary | ICD-10-CM | POA: Diagnosis not present

## 2016-05-15 DIAGNOSIS — L0202 Furuncle of face: Secondary | ICD-10-CM | POA: Diagnosis not present

## 2016-05-15 DIAGNOSIS — D225 Melanocytic nevi of trunk: Secondary | ICD-10-CM | POA: Diagnosis not present

## 2016-05-15 DIAGNOSIS — Z1283 Encounter for screening for malignant neoplasm of skin: Secondary | ICD-10-CM | POA: Diagnosis not present

## 2016-05-15 DIAGNOSIS — X32XXXD Exposure to sunlight, subsequent encounter: Secondary | ICD-10-CM | POA: Diagnosis not present

## 2016-05-15 DIAGNOSIS — B078 Other viral warts: Secondary | ICD-10-CM | POA: Diagnosis not present

## 2016-05-15 DIAGNOSIS — L570 Actinic keratosis: Secondary | ICD-10-CM | POA: Diagnosis not present

## 2016-05-25 DIAGNOSIS — E785 Hyperlipidemia, unspecified: Secondary | ICD-10-CM | POA: Diagnosis not present

## 2016-05-25 DIAGNOSIS — G4733 Obstructive sleep apnea (adult) (pediatric): Secondary | ICD-10-CM | POA: Diagnosis not present

## 2016-05-25 DIAGNOSIS — M1380 Other specified arthritis, unspecified site: Secondary | ICD-10-CM | POA: Diagnosis not present

## 2016-05-28 DIAGNOSIS — Z6835 Body mass index (BMI) 35.0-35.9, adult: Secondary | ICD-10-CM | POA: Diagnosis not present

## 2016-05-28 DIAGNOSIS — E039 Hypothyroidism, unspecified: Secondary | ICD-10-CM | POA: Diagnosis not present

## 2016-05-28 DIAGNOSIS — E782 Mixed hyperlipidemia: Secondary | ICD-10-CM | POA: Diagnosis not present

## 2016-05-28 DIAGNOSIS — G250 Essential tremor: Secondary | ICD-10-CM | POA: Diagnosis not present

## 2016-05-29 DIAGNOSIS — G250 Essential tremor: Secondary | ICD-10-CM | POA: Diagnosis not present

## 2016-05-29 DIAGNOSIS — E039 Hypothyroidism, unspecified: Secondary | ICD-10-CM | POA: Diagnosis not present

## 2016-05-29 DIAGNOSIS — Z1389 Encounter for screening for other disorder: Secondary | ICD-10-CM | POA: Diagnosis not present

## 2016-05-29 DIAGNOSIS — E782 Mixed hyperlipidemia: Secondary | ICD-10-CM | POA: Diagnosis not present

## 2016-06-13 DIAGNOSIS — M1990 Unspecified osteoarthritis, unspecified site: Secondary | ICD-10-CM | POA: Diagnosis not present

## 2016-06-13 DIAGNOSIS — J449 Chronic obstructive pulmonary disease, unspecified: Secondary | ICD-10-CM | POA: Diagnosis not present

## 2016-06-13 DIAGNOSIS — E039 Hypothyroidism, unspecified: Secondary | ICD-10-CM | POA: Diagnosis not present

## 2016-06-24 DIAGNOSIS — E785 Hyperlipidemia, unspecified: Secondary | ICD-10-CM | POA: Diagnosis not present

## 2016-06-24 DIAGNOSIS — G4733 Obstructive sleep apnea (adult) (pediatric): Secondary | ICD-10-CM | POA: Diagnosis not present

## 2016-06-24 DIAGNOSIS — M1380 Other specified arthritis, unspecified site: Secondary | ICD-10-CM | POA: Diagnosis not present

## 2016-07-25 DIAGNOSIS — G4733 Obstructive sleep apnea (adult) (pediatric): Secondary | ICD-10-CM | POA: Diagnosis not present

## 2016-07-25 DIAGNOSIS — M1380 Other specified arthritis, unspecified site: Secondary | ICD-10-CM | POA: Diagnosis not present

## 2016-07-25 DIAGNOSIS — E785 Hyperlipidemia, unspecified: Secondary | ICD-10-CM | POA: Diagnosis not present

## 2016-08-25 DIAGNOSIS — M1380 Other specified arthritis, unspecified site: Secondary | ICD-10-CM | POA: Diagnosis not present

## 2016-08-25 DIAGNOSIS — G4733 Obstructive sleep apnea (adult) (pediatric): Secondary | ICD-10-CM | POA: Diagnosis not present

## 2016-08-25 DIAGNOSIS — E785 Hyperlipidemia, unspecified: Secondary | ICD-10-CM | POA: Diagnosis not present

## 2016-08-26 DIAGNOSIS — H524 Presbyopia: Secondary | ICD-10-CM | POA: Diagnosis not present

## 2016-09-10 DIAGNOSIS — R5383 Other fatigue: Secondary | ICD-10-CM | POA: Diagnosis not present

## 2016-09-10 DIAGNOSIS — M79604 Pain in right leg: Secondary | ICD-10-CM | POA: Diagnosis not present

## 2016-09-10 DIAGNOSIS — G4730 Sleep apnea, unspecified: Secondary | ICD-10-CM | POA: Diagnosis not present

## 2016-09-10 DIAGNOSIS — G8310 Monoplegia of lower limb affecting unspecified side: Secondary | ICD-10-CM | POA: Diagnosis not present

## 2016-09-10 DIAGNOSIS — R739 Hyperglycemia, unspecified: Secondary | ICD-10-CM | POA: Diagnosis not present

## 2016-09-10 DIAGNOSIS — N400 Enlarged prostate without lower urinary tract symptoms: Secondary | ICD-10-CM | POA: Diagnosis not present

## 2016-09-10 DIAGNOSIS — Z6835 Body mass index (BMI) 35.0-35.9, adult: Secondary | ICD-10-CM | POA: Diagnosis not present

## 2016-09-10 DIAGNOSIS — E039 Hypothyroidism, unspecified: Secondary | ICD-10-CM | POA: Diagnosis not present

## 2016-09-12 DIAGNOSIS — H25813 Combined forms of age-related cataract, bilateral: Secondary | ICD-10-CM | POA: Diagnosis not present

## 2016-09-12 DIAGNOSIS — C61 Malignant neoplasm of prostate: Secondary | ICD-10-CM | POA: Diagnosis not present

## 2016-09-12 DIAGNOSIS — H25811 Combined forms of age-related cataract, right eye: Secondary | ICD-10-CM | POA: Diagnosis not present

## 2016-09-12 DIAGNOSIS — H25812 Combined forms of age-related cataract, left eye: Secondary | ICD-10-CM | POA: Diagnosis not present

## 2016-09-12 DIAGNOSIS — H0289 Other specified disorders of eyelid: Secondary | ICD-10-CM | POA: Diagnosis not present

## 2016-09-12 DIAGNOSIS — H35371 Puckering of macula, right eye: Secondary | ICD-10-CM | POA: Diagnosis not present

## 2016-09-22 DIAGNOSIS — G4733 Obstructive sleep apnea (adult) (pediatric): Secondary | ICD-10-CM | POA: Diagnosis not present

## 2016-09-22 DIAGNOSIS — E785 Hyperlipidemia, unspecified: Secondary | ICD-10-CM | POA: Diagnosis not present

## 2016-09-22 DIAGNOSIS — M1380 Other specified arthritis, unspecified site: Secondary | ICD-10-CM | POA: Diagnosis not present

## 2016-09-26 DIAGNOSIS — H2511 Age-related nuclear cataract, right eye: Secondary | ICD-10-CM | POA: Diagnosis not present

## 2016-09-26 DIAGNOSIS — H25811 Combined forms of age-related cataract, right eye: Secondary | ICD-10-CM | POA: Diagnosis not present

## 2016-09-26 HISTORY — PX: CATARACT EXTRACTION: SUR2

## 2016-10-01 ENCOUNTER — Encounter: Payer: Self-pay | Admitting: Neurology

## 2016-10-01 ENCOUNTER — Ambulatory Visit (INDEPENDENT_AMBULATORY_CARE_PROVIDER_SITE_OTHER): Payer: Medicare HMO | Admitting: Neurology

## 2016-10-01 VITALS — BP 126/71 | HR 64 | Ht 73.0 in | Wt 271.0 lb

## 2016-10-01 DIAGNOSIS — R29898 Other symptoms and signs involving the musculoskeletal system: Secondary | ICD-10-CM | POA: Diagnosis not present

## 2016-10-01 NOTE — Patient Instructions (Signed)
  We will check EMG and NCV evaluation.

## 2016-10-01 NOTE — Progress Notes (Signed)
Reason for visit: Leg weakness  Referring physician: Dr. Corrie Mckusick is a 76 y.o. male  History of present illness:  Taylor Bean is a 76 year old left-handed white male with a history of leg fatigability over the last 2 years. The patient has noted 2 separate issues. If he sits too long, when he first stands up he may have some achy pain in the hip area and into the groin and the upper part of the thighs on both sides. Once he stands up and takes a few steps, the stiffness and discomfort disappears. The patient has had a right total hip replacement and a right total knee replacement previously. The patient will note that if he continues to walk, he begins to have some fatigue of the muscles of the legs, he is unable to walk a mile at this point. The patient will have to stop and rest. He denies any significant back pain or pain down the legs. He denies any pain issues with the neck or with the arms. He denies any numbness of the extremities. He denies any issues controlling the bowels or the bladder. He has not had any significant change in balance. He has been relatively inactive since he retired 3 years ago. He believes that the fatigue of the muscles has not significantly worsened over the last 2 years. He denies any weakness or difficulty with climbing stairs. Given the symptoms, the patient is sent to this office for an evaluation. He believes that the sensation of fatigue is about equal from one leg to the next.  Past Medical History:  Diagnosis Date  . Arthritis    general  . Depression   . Elevated PSA   . Fibromyalgia   . H/O urinary stone   . Hyperlipidemia   . Osteoarthritis of right knee 01/19/2013  . Prostatitis   . Right rotator cuff tear 12/13/2011  . Sleep apnea    uses a cpap-will bring dos    Past Surgical History:  Procedure Laterality Date  . CATARACT EXTRACTION Right 09/26/2016  . COLONOSCOPY N/A 12/22/2014   SLF: 1. two colon polyps removed 2. Redundant  left colon/ moderate diverticulosis in the left colon 3. small internal hemorrhoids  . COLONOSCOPY W/ POLYPECTOMY     begin  . LEG SURGERY     fx rt leg as 76 yr old  . ROTATOR CUFF REPAIR Right 4/31/13  . TONSILLECTOMY    . TOTAL HIP ARTHROPLASTY  2009   Right  . TOTAL KNEE ARTHROPLASTY Right 01/19/2013   Procedure: TOTAL KNEE ARTHROPLASTY with right trocanteric bursal injection ;  Surgeon: Johnny Bridge, MD;  Location: [facility];  Service: Orthopedics;  Laterality: Right;    Family History  Problem Relation Age of Onset  . Heart attack Father   . Colon cancer Neg Hx   . Colon polyps Neg Hx     Social history:  reports that he quit smoking about 48 years ago. His smoking use included Cigarettes. He has a 2.50 pack-year smoking history. He has never used smokeless tobacco. He reports that he drinks about 0.6 oz of alcohol per week . He reports that he does not use drugs.  Medications:  Prior to Admission medications   Medication Sig Start Date End Date Taking? Authorizing Provider  diclofenac (VOLTAREN) 75 MG EC tablet Take 75 mg by mouth 2 (two) times daily.  11/01/14  Yes Historical Provider, MD  escitalopram (LEXAPRO) 10 MG tablet Take 10 mg  by mouth daily.    Yes Historical Provider, MD  omeprazole (PRILOSEC) 40 MG capsule Take 40 mg by mouth daily. 11/24/15  Yes Historical Provider, MD  simvastatin (ZOCOR) 20 MG tablet Take 20 mg by mouth at bedtime.     Yes Historical Provider, MD  Vitamin D, Ergocalciferol, (DRISDOL) 50000 units CAPS capsule Take 50,000 Units by mouth every 7 (seven) days.  09/11/16  Yes Historical Provider, MD      Allergies  Allergen Reactions  . Other     Standard Epoxy resin used for joint replacement   . Adhesive [Tape] Hives, Itching and Rash    ROS:  Out of a complete 14 system review of symptoms, the patient complains only of the following symptoms, and all other reviewed systems are negative.  Fatigue Tremor, left hand  Blood pressure 126/71,  pulse 64, height 6\' 1"  (1.854 m), weight 271 lb (122.9 kg).  Physical Exam  General: The patient is alert and cooperative at the time of the examination. The patient is markedly obese.  Eyes: Pupils are equal, round, and reactive to light. Discs are flat bilaterally.  Neck: The neck is supple, no carotid bruits are noted.  Respiratory: The respiratory examination is clear.  Cardiovascular: The cardiovascular examination reveals a regular rate and rhythm, no obvious murmurs or rubs are noted.  Neuromuscular: The patient is able to flex the low back to about 110, he cannot touch his toes.  Skin: Extremities are without significant edema.  Neurologic Exam  Mental status: The patient is alert and oriented x 3 at the time of the examination. The patient has apparent normal recent and remote memory, with an apparently normal attention span and concentration ability.  Cranial nerves: Facial symmetry is present. There is good sensation of the face to pinprick and soft touch bilaterally. The strength of the facial muscles and the muscles to head turning and shoulder shrug are normal bilaterally. Speech is well enunciated, no aphasia or dysarthria is noted. Extraocular movements are full. Visual fields are full. The tongue is midline, and the patient has symmetric elevation of the soft palate. No obvious hearing deficits are noted.  Motor: The motor testing reveals 5 over 5 strength of all 4 extremities. Good symmetric motor tone is noted throughout.  Sensory: Sensory testing is intact to pinprick, soft touch, vibration sensation, and position sense on all 4 extremities, with exception of a stocking pattern pinprick sensory deficit in the distal third of the legs bilaterally. No evidence of extinction is noted.  Coordination: Cerebellar testing reveals good finger-nose-finger and heel-to-shin bilaterally. There appears to be an action type tremor in the left greater than right upper extremity,  some resting component is also noted with the left arm.  Gait and station: Gait is normal. Tandem gait is slightly unsteady. Romberg is negative. No drift is seen.  Reflexes: Deep tendon reflexes are symmetric and normal bilaterally. Toes are downgoing bilaterally.   Assessment/Plan:  1. Lower extremity fatigability  2. Left upper extremity tremor  The patient has a relatively unremarkable clinical examination. The patient appears to have 2 separate issues, he reports some discomfort and stiffness in the hip joints when he first stands up, this likely is related to arthritis. The patient then reports some fatigability of the legs with prolonged walking. The possibility of a lumbosacral spinal stenosis should be entertained. The patient be set up for nerve conduction studies of both legs, EMG of one leg. If this study is unremarkable, we may consider  MRI of the lumbosacral spine. The patient could potentially have simple deconditioning with a relatively inactive lifestyle.  Taylor Alexanders MD 10/01/2016 9:07 AM  [facility] [address] [place], [place] [address]  Phone [phone] Fax [phone]

## 2016-10-02 ENCOUNTER — Telehealth: Payer: Self-pay | Admitting: Neurology

## 2016-10-02 LAB — SEDIMENTATION RATE: Sed Rate: 6 mm/hr (ref 0–30)

## 2016-10-02 LAB — ACETYLCHOLINE RECEPTOR, BINDING: AChR Binding Ab, Serum: <0.03 nmol/L (ref 0.00–0.24)

## 2016-10-02 LAB — CK: Total CK: 210 U/L — ABNORMAL HIGH (ref 24–204)

## 2016-10-02 NOTE — Telephone Encounter (Signed)
I called patient. The blood work was unremarkable with exception that the muscle enzyme level was slightly elevated at 210, upper limits of normal is 204.  We will check EMGs study, looking for evidence of denervation or primary muscle disorder.

## 2016-10-03 DIAGNOSIS — H2513 Age-related nuclear cataract, bilateral: Secondary | ICD-10-CM | POA: Diagnosis not present

## 2016-10-08 ENCOUNTER — Ambulatory Visit (INDEPENDENT_AMBULATORY_CARE_PROVIDER_SITE_OTHER): Payer: Medicare HMO | Admitting: Urology

## 2016-10-08 DIAGNOSIS — N400 Enlarged prostate without lower urinary tract symptoms: Secondary | ICD-10-CM | POA: Diagnosis not present

## 2016-10-08 DIAGNOSIS — C61 Malignant neoplasm of prostate: Secondary | ICD-10-CM

## 2016-10-22 ENCOUNTER — Encounter: Payer: Self-pay | Admitting: Neurology

## 2016-10-22 ENCOUNTER — Ambulatory Visit (INDEPENDENT_AMBULATORY_CARE_PROVIDER_SITE_OTHER): Payer: Medicare HMO | Admitting: Neurology

## 2016-10-22 ENCOUNTER — Ambulatory Visit (INDEPENDENT_AMBULATORY_CARE_PROVIDER_SITE_OTHER): Payer: Self-pay | Admitting: Neurology

## 2016-10-22 DIAGNOSIS — M6281 Muscle weakness (generalized): Secondary | ICD-10-CM | POA: Diagnosis not present

## 2016-10-22 DIAGNOSIS — R29898 Other symptoms and signs involving the musculoskeletal system: Secondary | ICD-10-CM

## 2016-10-22 NOTE — Procedures (Signed)
     HISTORY:  Taylor Bean is a 77 year old gentleman with a history of lower extremity fatigue and weakness. The patient has noted that he is unable to ambulate long distances without stopping and resting. He denies any pain or numbness in the extremities. The patient is being evaluated for possible neuropathy or a lumbosacral radiculopathy.  NERVE CONDUCTION STUDIES:  Nerve conduction studies were performed on both lower extremities. The distal motor latencies and motor amplitudes for the peroneal and posterior tibial nerves were within normal limits bilaterally. The nerve conduction velocities for these nerves were within normal limits bilaterally.  The peroneal sensory latencies were unobtainable bilaterally, and the H reflex latencies were unobtainable bilaterally.  EMG STUDIES:  The EMG study was performed on the left lower extremity:  The tibialis anterior muscle reveals 2 to 4K motor units with full recruitment. No fibrillations or positive waves were seen. The peroneus tertius muscle reveals 2 to 4K motor units with full recruitment. No fibrillations or positive waves were seen. The medial gastrocnemius muscle reveals 1 to 3K motor units with full recruitment. No fibrillations or positive waves were seen. The vastus lateralis muscle reveals 2 to 4K motor units with full recruitment. No fibrillations or positive waves were seen. The iliopsoas muscle reveals 2 to 4K motor units with full recruitment. No fibrillations or positive waves were seen. The biceps femoris muscle (long head) reveals 2 to 4K motor units with full recruitment. No fibrillations or positive waves were seen. The lumbosacral paraspinal muscles were tested at 3 levels, and revealed no abnormalities of insertional activity at all 3 levels tested. There was good relaxation.   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 EMG  evaluation of the left lower extremity was unremarkable, without evidence of an overlying lumbosacral radiculopathy.  Jill Alexanders MD 10/22/2016 9:23 AM  [facility] [address] [place], [place] [address]  Phone [phone] Fax [phone]

## 2016-10-22 NOTE — Progress Notes (Signed)
Please refer to EMG and nerve conduction study procedure note. 

## 2016-10-22 NOTE — Progress Notes (Signed)
The EMG and nerve conduction studies done today shows a primarily sensory peripheral neuropathy. EMG of the left leg was unremarkable, no explanation for the lower extremity fatigue is seen.  The patient will be sent for MRI of the lumbar spine to exclude spinal stenosis as an etiology.

## 2016-10-23 ENCOUNTER — Ambulatory Visit
Admission: RE | Admit: 2016-10-23 | Discharge: 2016-10-23 | Disposition: A | Discharge status: Home / Self Care | Payer: Medicare HMO | Source: Ambulatory Visit | Attending: Neurology | Admitting: Neurology

## 2016-10-23 ENCOUNTER — Telehealth: Payer: Self-pay | Admitting: Neurology

## 2016-10-23 DIAGNOSIS — E785 Hyperlipidemia, unspecified: Secondary | ICD-10-CM | POA: Diagnosis not present

## 2016-10-23 DIAGNOSIS — M47816 Spondylosis without myelopathy or radiculopathy, lumbar region: Secondary | ICD-10-CM | POA: Diagnosis not present

## 2016-10-23 DIAGNOSIS — R29898 Other symptoms and signs involving the musculoskeletal system: Secondary | ICD-10-CM

## 2016-10-23 DIAGNOSIS — G4733 Obstructive sleep apnea (adult) (pediatric): Secondary | ICD-10-CM | POA: Diagnosis not present

## 2016-10-23 DIAGNOSIS — M1380 Other specified arthritis, unspecified site: Secondary | ICD-10-CM | POA: Diagnosis not present

## 2016-10-23 NOTE — Telephone Encounter (Signed)
I called patient. MRI of the low back is unremarkable. There is no evidence of spinal stenosis. The patient denies shortness of breath, chest pain, palpitations of the heart that would limit his ability to exercise. I have recommended graded exercise to try to increase stamina and strength. The patient will try to get into an exercise program.   MRI lumbar 10/23/16:  IMPRESSION: 1. Multilevel lumbar disc degeneration, greatest at L3-4 where there is mild right neural foraminal stenosis. 2. No spinal stenosis. 3. Mild-to-moderate facet arthrosis.

## 2016-10-29 DIAGNOSIS — H25812 Combined forms of age-related cataract, left eye: Secondary | ICD-10-CM | POA: Diagnosis not present

## 2016-10-29 DIAGNOSIS — H2512 Age-related nuclear cataract, left eye: Secondary | ICD-10-CM | POA: Diagnosis not present

## 2016-10-31 DIAGNOSIS — G4733 Obstructive sleep apnea (adult) (pediatric): Secondary | ICD-10-CM | POA: Diagnosis not present

## 2016-11-04 DIAGNOSIS — H2513 Age-related nuclear cataract, bilateral: Secondary | ICD-10-CM | POA: Diagnosis not present

## 2016-11-29 DIAGNOSIS — Z6835 Body mass index (BMI) 35.0-35.9, adult: Secondary | ICD-10-CM | POA: Diagnosis not present

## 2016-11-29 DIAGNOSIS — J449 Chronic obstructive pulmonary disease, unspecified: Secondary | ICD-10-CM | POA: Diagnosis not present

## 2016-11-29 DIAGNOSIS — M1991 Primary osteoarthritis, unspecified site: Secondary | ICD-10-CM | POA: Diagnosis not present

## 2016-11-29 DIAGNOSIS — Z1389 Encounter for screening for other disorder: Secondary | ICD-10-CM | POA: Diagnosis not present

## 2016-11-29 DIAGNOSIS — E782 Mixed hyperlipidemia: Secondary | ICD-10-CM | POA: Diagnosis not present

## 2017-04-15 ENCOUNTER — Ambulatory Visit: Payer: Self-pay | Admitting: Urology

## 2017-05-08 NOTE — Progress Notes (Signed)
Formatting of this note is different from the original.  Chief Complaint  Chief Complaint   Patient presents with   ? [place], moved here recently and needs to est care. Treated currently for URI.     HPI  Elijah Perry is a 76 y.o. male who presents today to establish care. 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 He had a CT scan and was told he did not have spinal stenosis. There is no family history of tremors. He has not had no falls. He had full control of bowel and bladder. Tremor is left hand and was married over fifty years. It has stayed the same.    He has been retired for four years. He was working at [employer].    He has SOB. He noticed it for three years. His weight was 280 when he retired.    Sunday he went to urgent care on [address] and was cohe got Augmentin for sinus congestion, cough, sneezing He was stationary     Seasonal allergies.    He was exposed to second     She has Anxiety and Depression. Lexapro has been on board.    He hypothyroidism one year and last check was a year ago and good.    He had prostatitis and hematuria. He had a prostate biopsy and on of 12 was positive and a year later it was positive. He was getting PSA every six months    Past Medical History  Past Medical History:   Diagnosis Date   ? Anxiety    ? Depression    ? Disease of thyroid gland    ? Hyperlipidemia    ? Prostate CA (CMS-HCC)    ? SOB (shortness of breath)    ? Tremor of left hand      Surgical   Past Surgical History:   Procedure Laterality Date   ? EYE SURGERY     ? FRACTURE SURGERY     ? JOINT REPLACEMENT     ? ROTATOR CUFF REPAIR Right      Current Medications    Current Outpatient Prescriptions:   ?  diclofenac (VOLTAREN) 75 MG EC tablet, Take 75 mg by mouth Two (2) times a day., Disp: , Rfl:   ?  amoxicillin-clavulanate (AUGMENTIN) 875-125 mg per tablet, Take 1 tablet by mouth Two (2) times a day., Disp: ,  Rfl: 0  ?  BREO ELLIPTA 200-25 mcg/dose inhaler, Inhale 1 puff daily., Disp: , Rfl: 12  ?  escitalopram oxalate (LEXAPRO) 10 MG tablet, Take 10 mg by mouth daily., Disp: , Rfl: 0  ?  levothyroxine (SYNTHROID, LEVOTHROID) 50 MCG tablet, Take 50 mcg by mouth daily., Disp: , Rfl: 0  ?  MUCINEX D 60-600 mg per tablet, Take 1 tablet by mouth Two (2) times a day., Disp: , Rfl: 0  ?  simvastatin (ZOCOR) 20 MG tablet, Take 20 mg by mouth nightly., Disp: , Rfl:     Allergies  Allergies   Allergen Reactions   ? Adhesive Tape-Silicones Hives, Itching and Rash     Family Medical History  No family history on file.    Social History  Social History     Social History   ? Marital status: N/A     Spouse name: N/A   ? Number of children: N/A   ? Years  of education: N/A     Social History Main Topics   ? Smoking status: Former Smoker     Packs/day: 0.25     Start date: 07/15/1957     Quit date: 07/15/1965   ? Smokeless tobacco: Never Used   ? Alcohol use No   ? Drug use: No   ? Sexual activity: Not on file     Other Topics Concern   ? Not on file     Social History Narrative   ? No narrative on file     I have personally reviewed the patient's past medical, surgical history, family, & social histories.  I have reviewed any relevant past encounter notes, progress notes, and/or labs if they were available.      Review of Systems  Review of Systems   Constitutional: Negative.    HENT: Positive for ear discharge (right), sinus pain and sinus pressure. Negative for postnasal drip, sneezing and sore throat.    Eyes: Negative for visual disturbance.   Respiratory: Negative.    Cardiovascular: Negative.    Gastrointestinal: Negative.    Endocrine: Negative.    Genitourinary: Negative for dysuria.   Musculoskeletal: Negative.    Neurological: Negative.    Psychiatric/Behavioral: Negative.      Physical Exam  VITAL SIGNS: BP 132/72   Pulse 75   Resp 18   Ht 182.9 cm (6')   Wt (!) 122.5 kg (270 lb)   SpO2 96%   BMI 36.62 kg/m ;       05/08/17 1358   PainSc: 0-No pain     PHQ-9 Score  Little interest or pleasure in doing things: Several days   Feeling down, depressed, or hopeless; (age 49-17) Feeling down, depressed, irritable, or hopeless: Not at all     Trouble falling or staying asleep, or sleeping too much: Not at all     Feeling tired or having little energy: Several days     Poor appetite or overeating; (age 69-17) Poor appetite, weight loss or overeating: Not at all     Feeling bad about yourself - or that you are a failure or have let yourself or your family down; (age 77-17) Feeling bad about yourself - or feeling that you are a failure, or that you have let yourself or your family down: Not at all    Trouble concentrating on things, such as reading the newspaper or watching television; (age 48-17) Trouble concentrating on things like schoolwork, reading or watching TV: Not at all   Moving or speaking so slowly that other people could have noticed. Or the opposite - being so fidgety or restless that you have been moving around a lot more than usual: Not at all    Thoughts that you would be better off dead, or of hurting yourself in some way: Not at all    PHQ-9 TOTAL SCORE: 2     PHQ-9 Total Score Depression Severity:: None-Minimal       Physical Exam   Constitutional: He appears well-developed and well-nourished.   HENT:   Head: Normocephalic.   Right Ear: There is drainage and tenderness.   Nose: Nose normal.   Mouth/Throat: Oropharynx is clear and moist. No oropharyngeal exudate.   Neck: Normal range of motion. Neck supple. No JVD present. No thyromegaly present.   Cardiovascular: Normal rate, regular rhythm, normal heart sounds and intact distal pulses.  Exam reveals no gallop and no friction rub.    No murmur heard.  Pulmonary/Chest: Effort  normal and breath sounds normal. No respiratory distress. He has no wheezes. He has no rales. He exhibits no tenderness.   Abdominal: Soft. Bowel sounds are normal. He exhibits no distension and  no mass. There is no tenderness. There is no rebound and no guarding.   Musculoskeletal: Normal range of motion. He exhibits no edema.   Neurological: He is alert. He has normal reflexes.   Nursing note and vitals reviewed.    Medical Decision Making and Plan  Patient characteristics, differential diagnosis, and current guidelines considered.  I have discussed the diagnosis, treatment, any prescriptions or therapies given and expected course of treatment.  The patient states understanding and willingness to follow the current treatment plan.    PCMH Components:     Barriers to goals identified and addressed. Pertinent handouts were given today and reviewed with the patient as indicated.  The Care Plan and Self-Management goals have been included on the AVS and the AVS has been printed. . Any outside resources or referrals needed at this time are noted above. Patient's current medications have been reviewed. Any new medications prescribed have been discussed, and side effects have been addressed. Have assessed the patient's understanding, response, and barriers to adherence to medications.Patient voiced understanding and all questions have been answered to satisfaction.     Final Impression  Acute otitis externa of right ear  Corticosporin given    Shortness of breath  CXR ordered    Acquired hypothyroidism  Thyroid profile ordered    Abnormal prostate biopsy  Urology referral given    Need for pneumococcal vaccine  given    Dustin was seen today for establish care.    Diagnoses and all orders for this visit:    Abnormal prostate biopsy  -     Ambulatory referral to Urology; Future  -     PSA, Diagnostic; Future    Acquired hypothyroidism  -     TSH; Future  -     T3, free; Future  -     T4, Free; Future    Shortness of breath  -     XR Chest 2 views; Future    Acute otitis externa of right ear, unspecified type  -     neomycin-polymyxin-hydrocortisone (CORTISPORIN) 3.5-10,000-1 mg/mL-unit/mL-% otic suspension;  Administer 4 drops to the right ear Three (3) times a day. for 10 days    Other orders  -     PNEUMOCOCCAL CONJUGATE VACCINE 13-VALENT    This note was partially constructed using the [employer] (a voice recognition system) and may contain typographical errors missed during proofreading.    Jacqueline L[place]  [address]    Phone:  [phone]  Fax: [phone]  Electronically signed by Janann August, MD at 05/11/2017  8:00 PM EDT

## 2017-05-11 NOTE — Assessment & Plan Note (Signed)
Associated Problem(s): Prostate cancer (CMS-HCC)  Formatting of this note might be different from the original.  Urology referral given  Electronically signed by Janann August, MD at 05/11/2017  7:56 PM EDT

## 2017-05-11 NOTE — Assessment & Plan Note (Signed)
Associated Problem(s): Acute otitis externa of right ear (Resolved 11/26/2017)  Formatting of this note might be different from the original.  Corticosporin given  Electronically signed by Janann August, MD at 05/11/2017  7:55 PM EDT

## 2017-05-11 NOTE — Assessment & Plan Note (Signed)
Associated Problem(s): Shortness of breath (Resolved 11/26/2017)  Formatting of this note might be different from the original.  CXR ordered  Electronically signed by Janann August, MD at 05/11/2017  7:56 PM EDT

## 2017-05-11 NOTE — Assessment & Plan Note (Signed)
Associated Problem(s): Acquired hypothyroidism  Formatting of this note might be different from the original.  Thyroid profile ordered  Electronically signed by Janann August, MD at 05/11/2017  7:56 PM EDT

## 2017-05-11 NOTE — Assessment & Plan Note (Signed)
Associated Problem(s): Need for pneumococcal vaccine (Resolved 11/26/2017)  Formatting of this note might be different from the original.  given  Electronically signed by Janann August, MD at 05/11/2017  7:57 PM EDT

## 2017-08-16 NOTE — Telephone Encounter (Signed)
Formatting of this note might be different from the original.  Below are your recent test results: Your sugar is in the prediabetic range. Diabetes starts at 6.5 or higher. I recommend a nutrition consult and aerobic exercise and I placed a referral.  Electronically signed by Janann August, MD at 08/16/2017  5:11 PM EST

## 2017-11-18 IMAGING — DX DG CHEST 2V
2 series · 2 of 2 positions shown · non-contrast
Comparison: 09/30/2007

CLINICAL DATA: Productive cough

EXAM:
CHEST  2 VIEW

[chest pa]
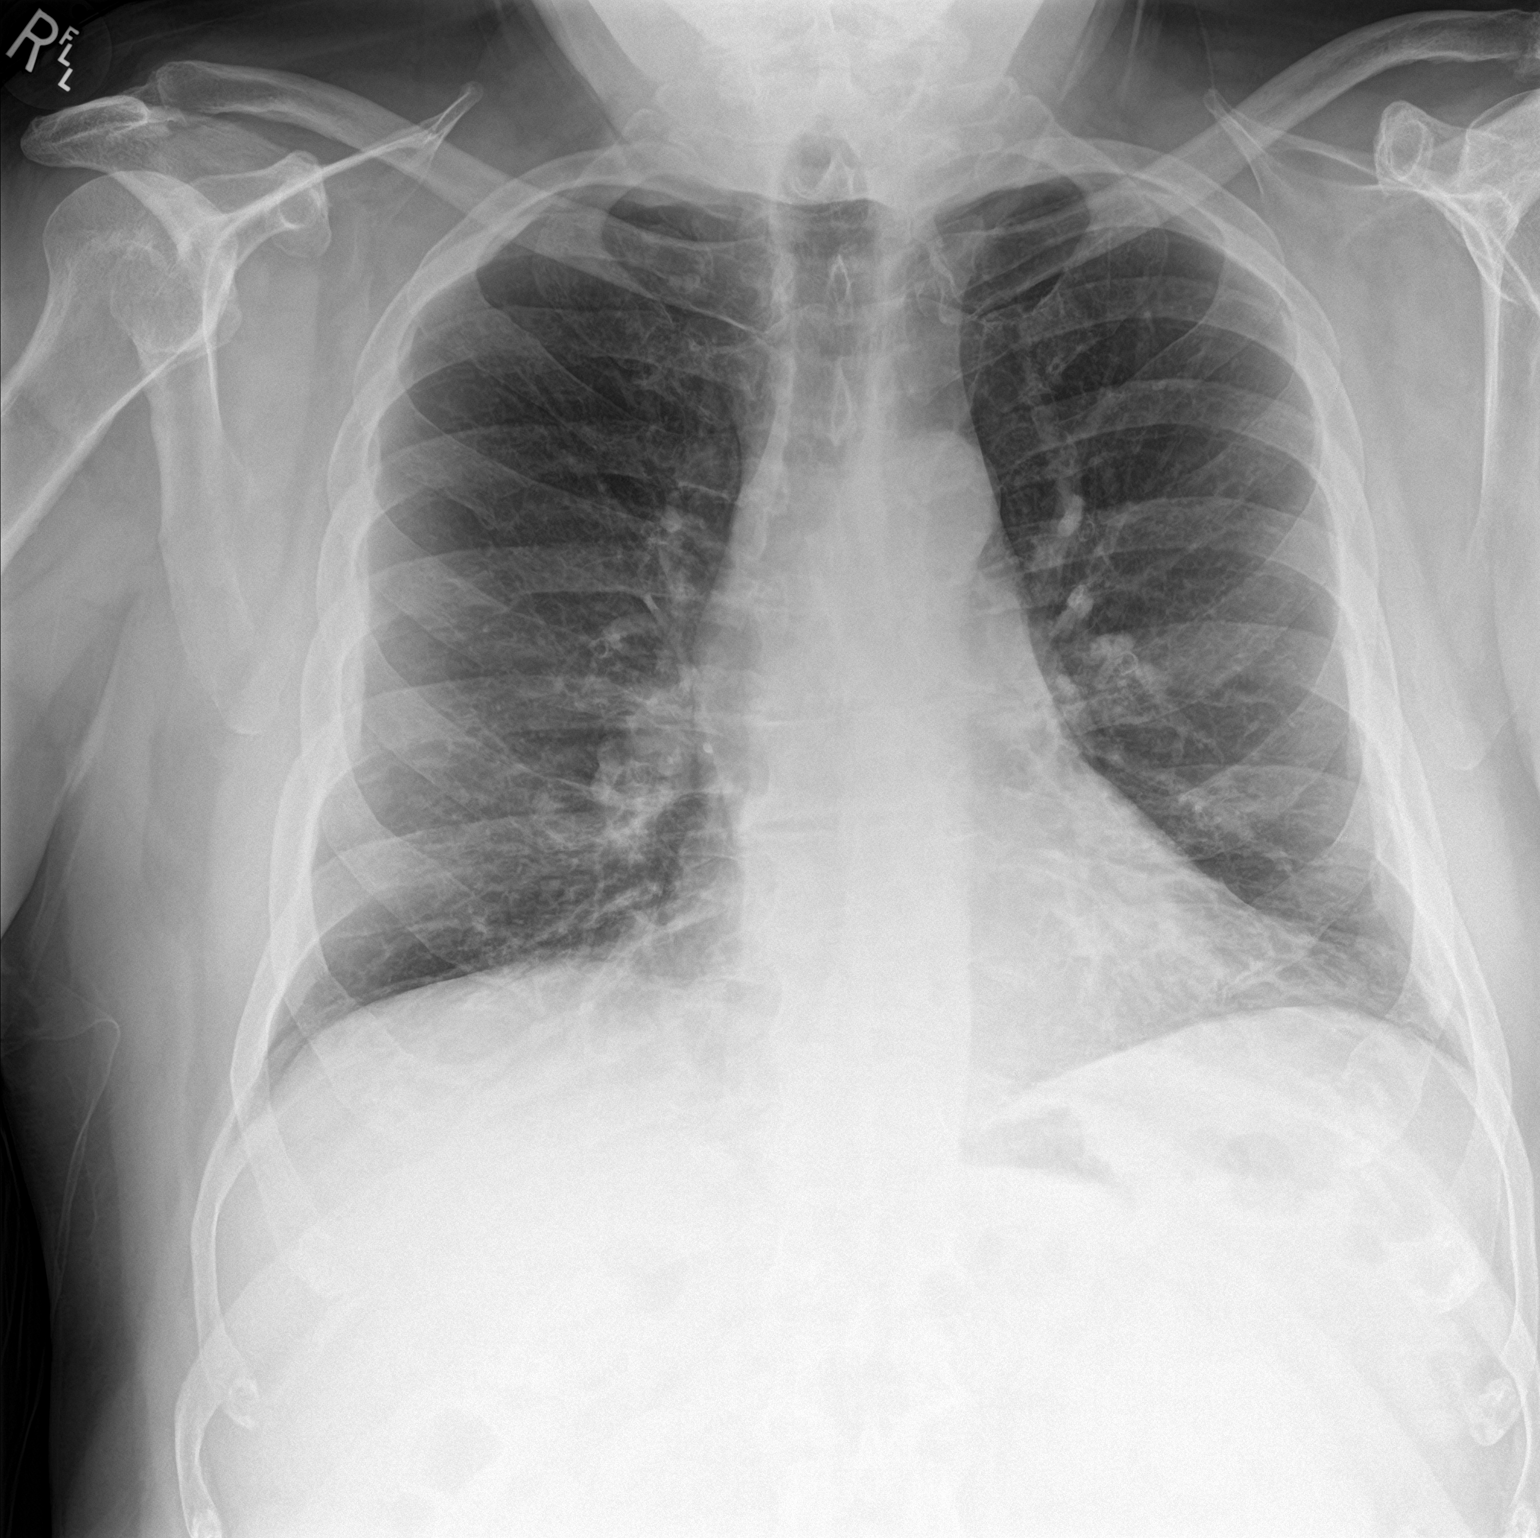

[chest lat]
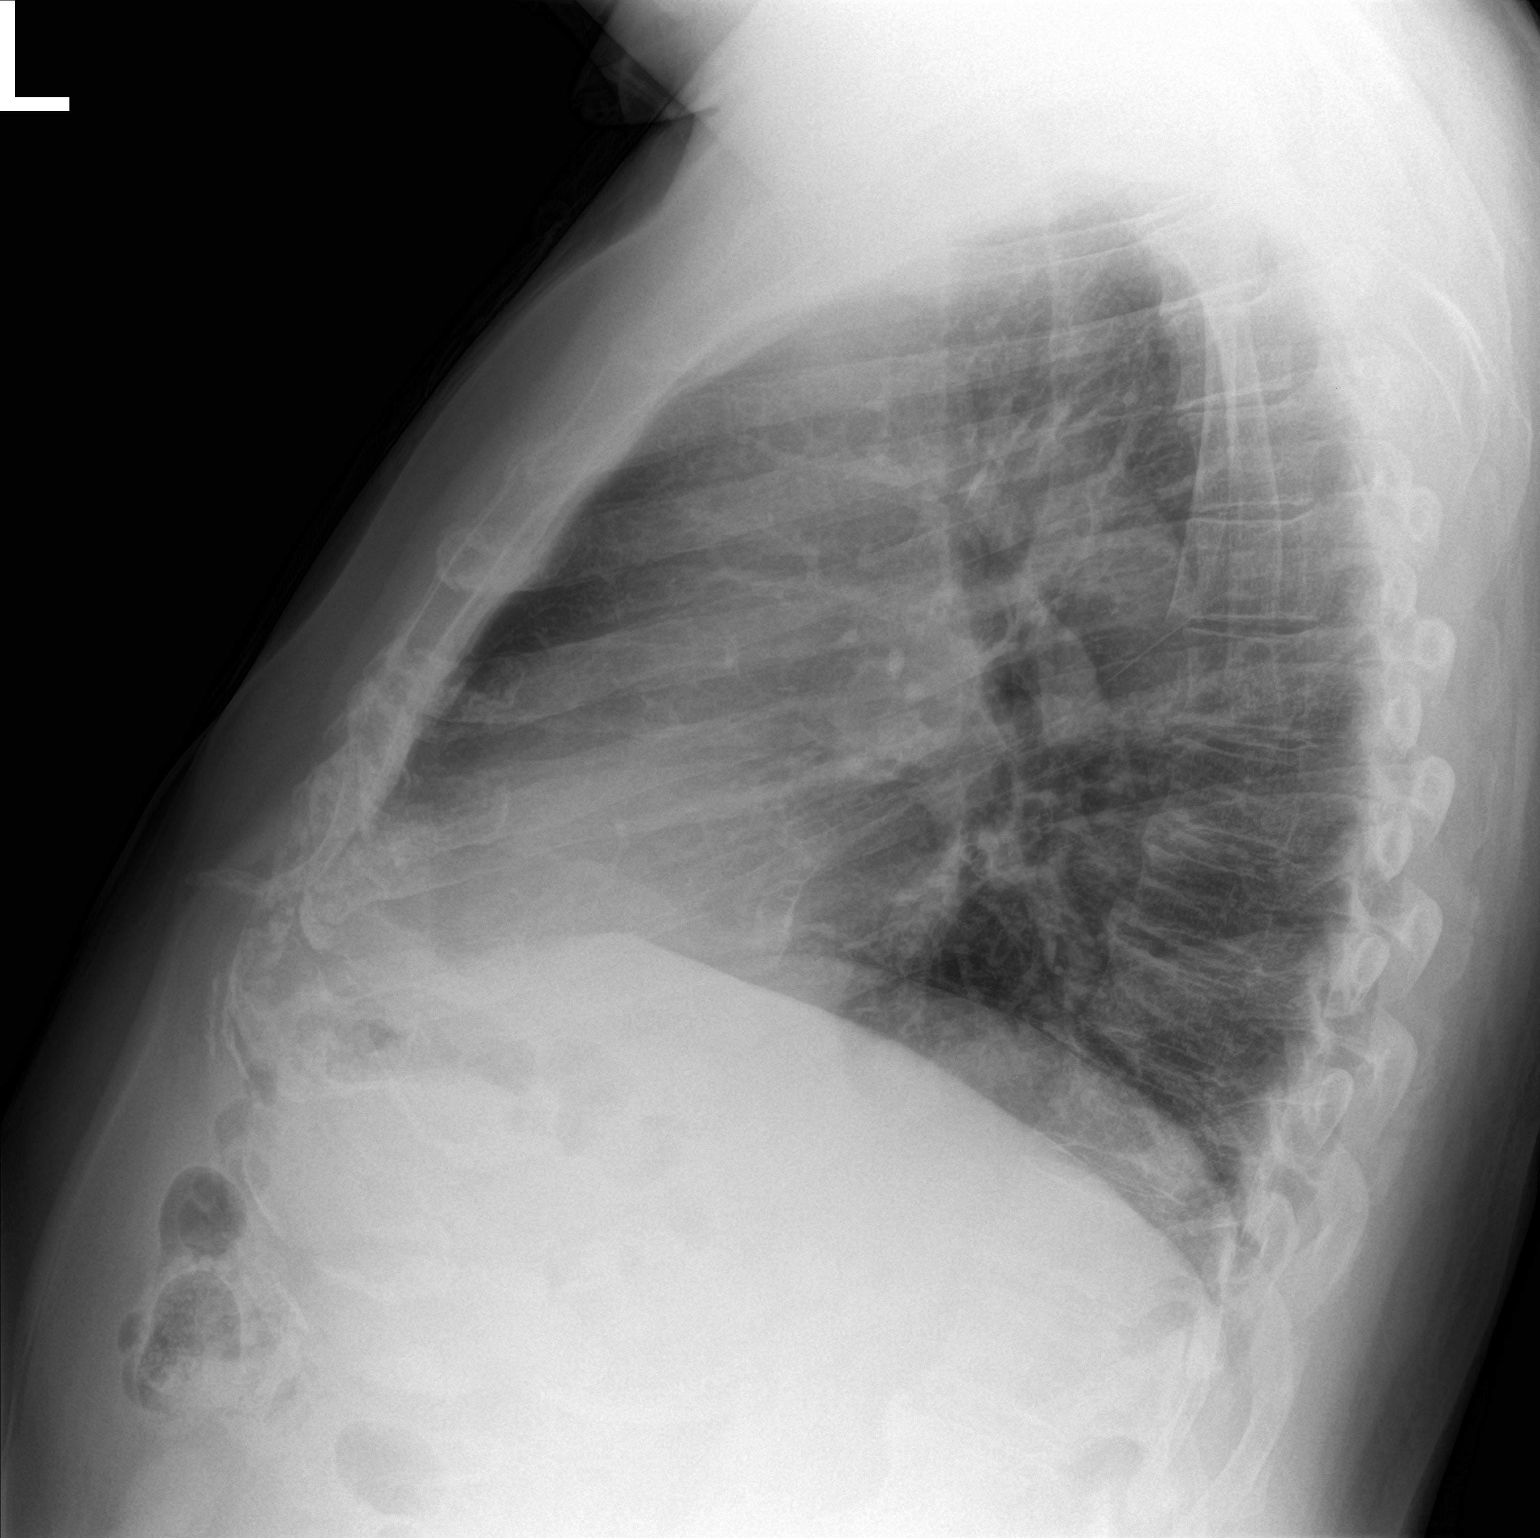

[2 of 2 positions shown; findings below may reference images not displayed]

FINDINGS: Heart is normal size. No confluent airspace opacities or effusions.
No acute bony abnormality.
IMPRESSION: No active cardiopulmonary disease.

## 2018-02-18 IMAGING — US US TRANSRECTAL
1 series · 14 of 16 positions shown · non-contrast
Comparison: none

[Series 1: us transrectal · 0.13mm/px · 14 of 19 slices shown]
[im 1/19]
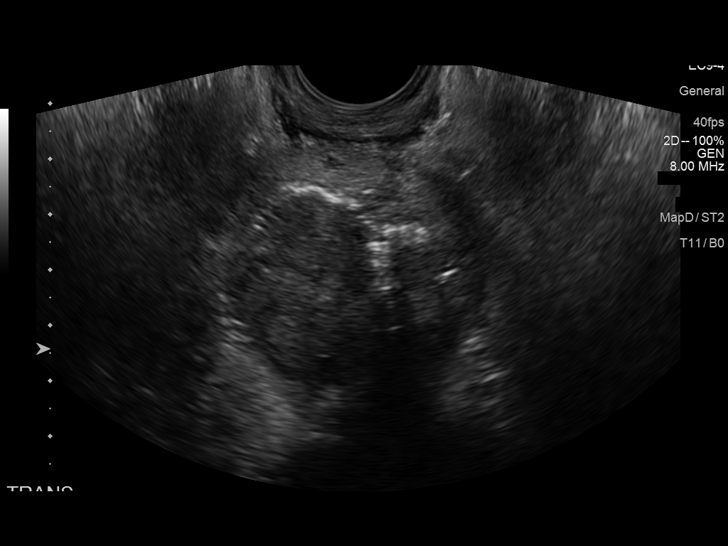
[im 2/19]
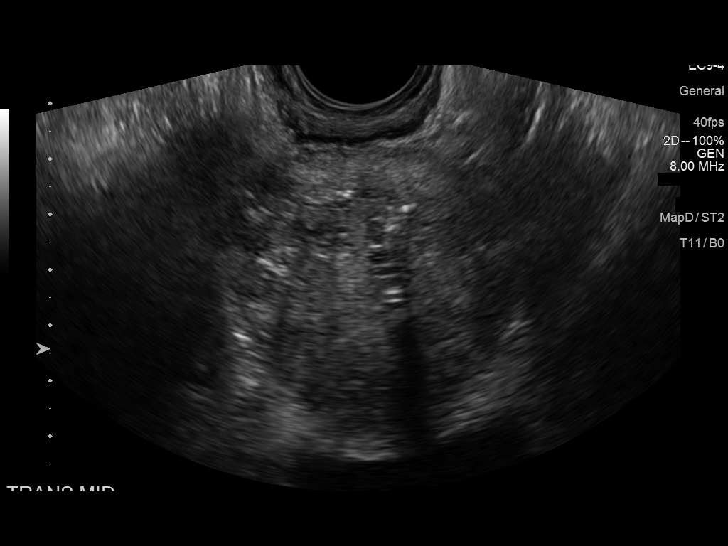
[im 3/19]
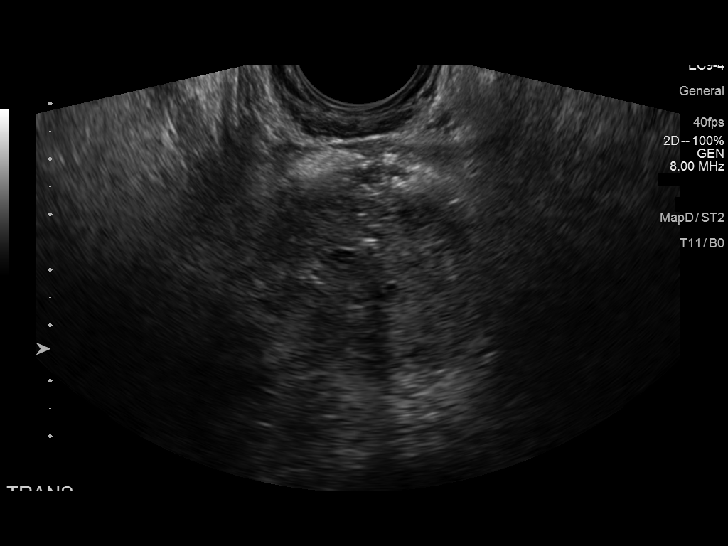
[im 5/19]
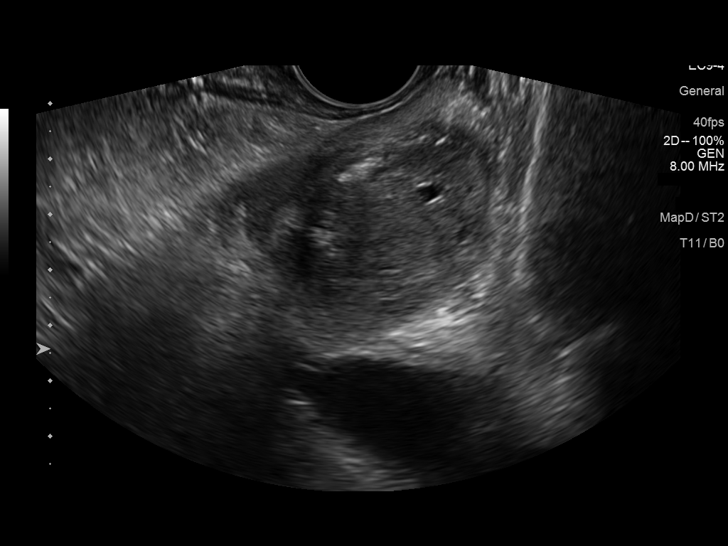
[im 7/19]
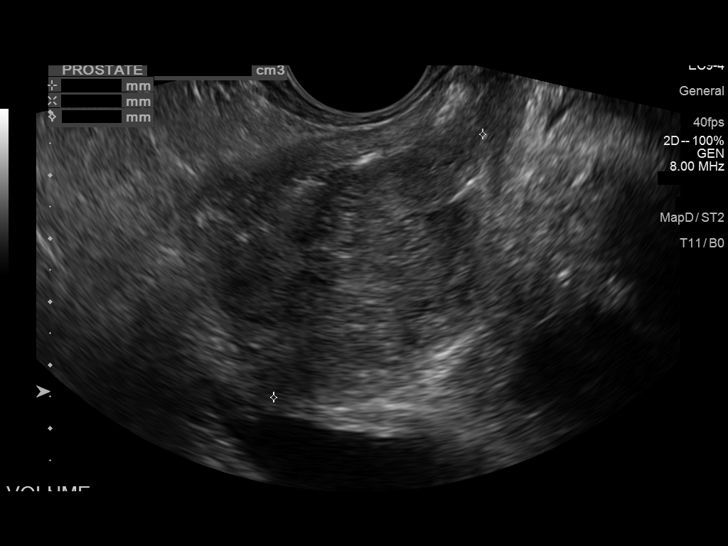
[im 8/19]
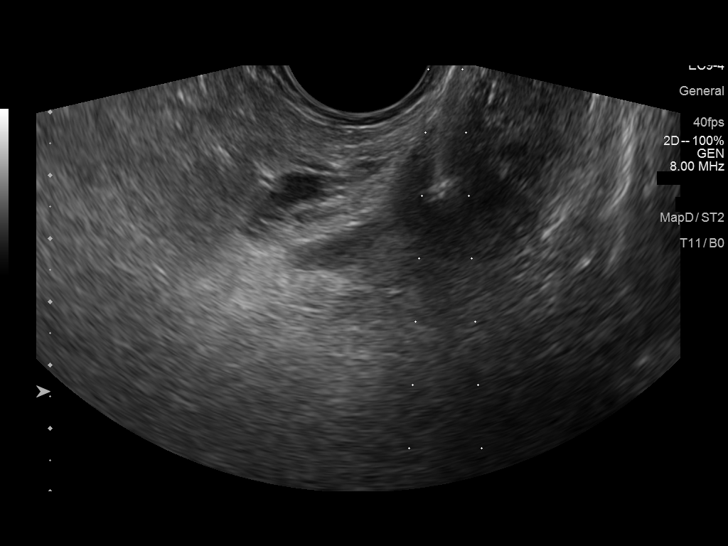
[im 9/19]
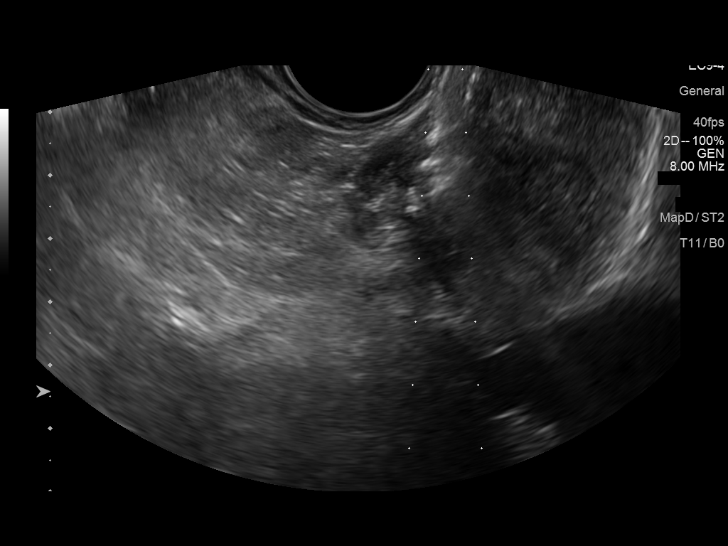
[im 10/19]
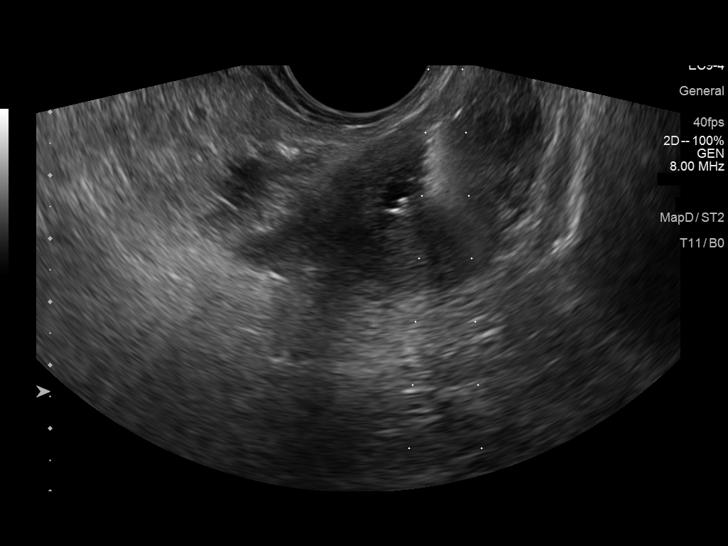
[im 11/19]
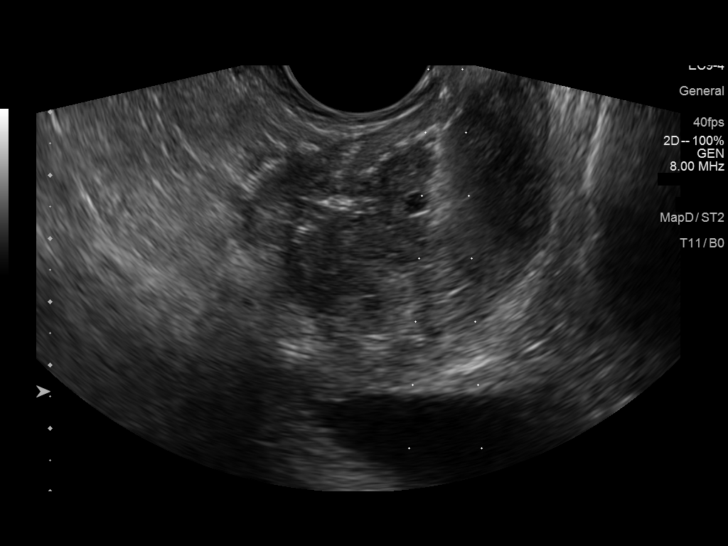
[im 13/19]
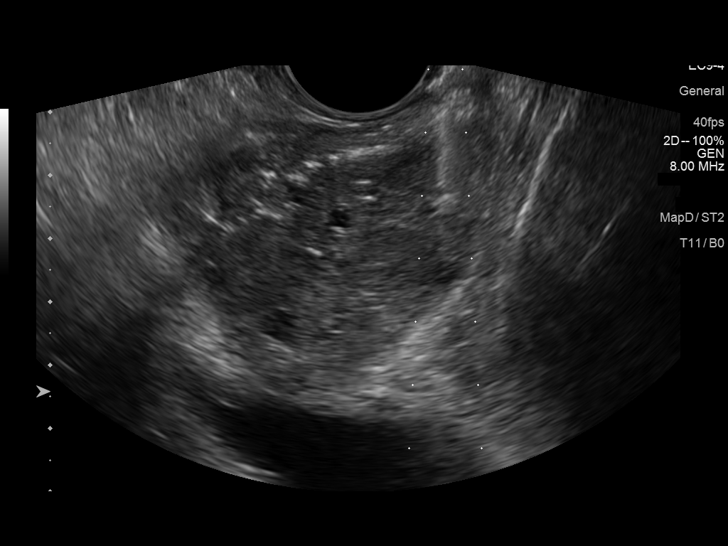
[im 15/19]
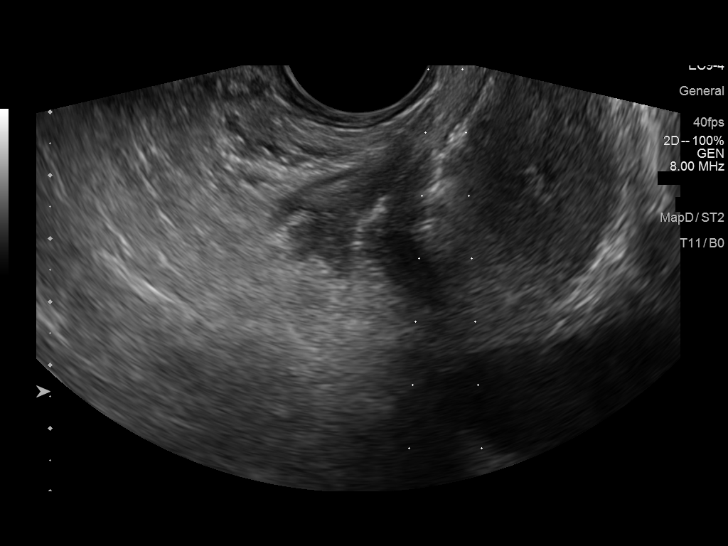
[im 16/19]
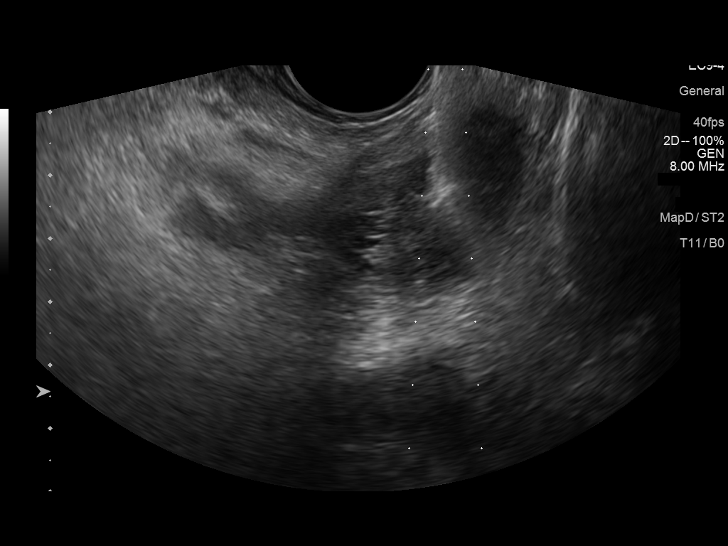
[im 17/19]
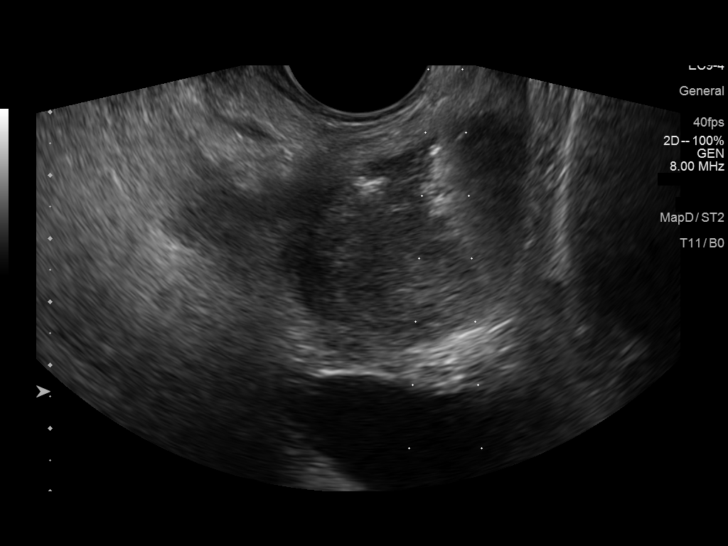
[im 19/19]
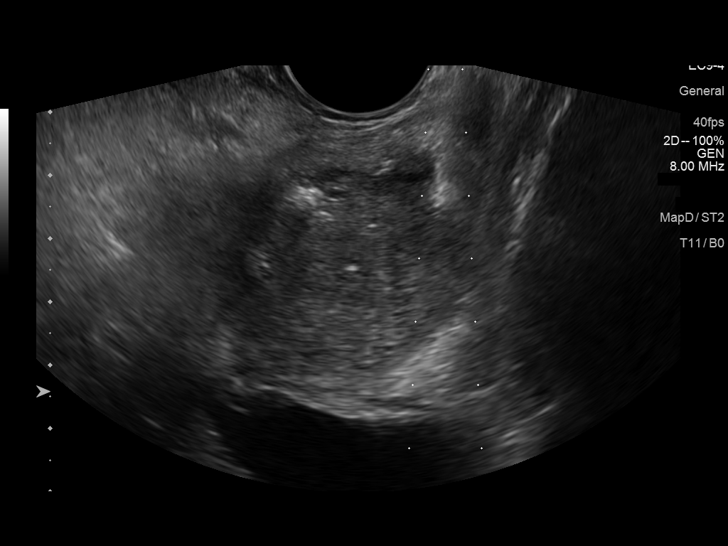

[14 of 16 positions shown; findings below may reference images not displayed]

Canned report from images found in remote index.

Refer to host system for actual result text.

## 2019-02-04 NOTE — Progress Notes (Signed)
Formatting of this note is different from the original.  Chief Complaint  Chief Complaint   Patient presents with   ? Follow-up     pt had MRI of prostate, Dr told pt it looked good, ? cont B12 injection,    ? Weakness     legs are weak, can't stand for long periods, ? doing injections again     HPI  Elijah Perry is a 78 y.o. male who presents today for a follow up visit. He has leg weakness from hips down. He has DOE. His daily activities are affected. He had EMG in [place]. He has idiopathic neuropathy.    He has a history of prostate cancer.Since last visit he saw Dr. Zenia Resides. Hs Prostate MRI looked good.    He is amenable to seeing Dr. Waldemar Dickens for his LE weakness.    He got Breo in [place]. He smoked for ten to twelve year.He stopped smoking a long time ago and was never diagnosed with COPD. He would like to see pulmonary given DOE.    Past Medical History  Past Medical History:   Diagnosis Date   ? Anxiety    ? At risk for falls    ? Depression    ? Disease of thyroid gland    ? Hyperlipidemia    ? Prostate CA (CMS-HCC)    ? SOB (shortness of breath)    ? Tremor of left hand    ? Tremors of nervous system      Surgical   Past Surgical History:   Procedure Laterality Date   ? EYE SURGERY     ? FRACTURE SURGERY     ? JOINT REPLACEMENT     ? ROTATOR CUFF REPAIR Right    ? SKIN BIOPSY       Current Medications    Current Outpatient Medications:   ?  albuterol HFA 90 mcg/actuation inhaler, Inhale 2 puffs every six (6) hours as needed for wheezing., Disp: , Rfl:   ?  diclofenac (VOLTAREN) 75 MG EC tablet, Take 1 tablet (75 mg total) by mouth Two (2) times a day. TAKE 1 TABLET (75 MG TOTAL) BY MOUTH TWO (2) TIMES A DAY., Disp: 180 tablet, Rfl: 3  ?  escitalopram oxalate (LEXAPRO) 20 MG tablet, Take 1 tablet (20 mg total) by mouth daily., Disp: 90 tablet, Rfl: 3  ?  fluticasone propionate (FLOVENT HFA) 44 mcg/actuation inhaler, Inhale 2 puffs Two (2) times a day., Disp: 3 Inhaler, Rfl: 3  ?  levothyroxine  (SYNTHROID) 50 MCG tablet, Take 1 tablet (50 mcg total) by mouth daily., Disp: 90 tablet, Rfl: 3  ?  rosuvastatin (CRESTOR) 40 MG tablet, Take 1 tablet (40 mg total) by mouth daily., Disp: 90 tablet, Rfl: 3  ?  tamsulosin (FLOMAX) 0.4 mg capsule, TAKE 1 CAPSULE BY MOUTH EVERY DAY, Disp: 90 capsule, Rfl: 3  ?  primidone (MYSOLINE) 50 MG tablet, 1 tablet at 9 am for 7 days, then 1 at 9 and and 1 at 3 pm.., Disp: 180 tablet, Rfl: 3    Current Facility-Administered Medications:   ?  cyanocobalamin injection 1,000 mcg, 1,000 mcg, Intramuscular, Q30 Days, Janann August, MD, 1,000 mcg at 09/03/18 1535    Allergies  Allergies   Allergen Reactions   ? Adhesive Tape-Silicones Hives, Itching and Rash     Family Medical History  Family History   Problem Relation Age of Onset   ? Heart disease Father    ? Heart  attack Father    ? No Known Problems Mother      Social History  Social History     Socioeconomic History   ? Marital status: Married     Spouse name: None   ? Number of children: None   ? Years of education: None   ? Highest education level: None   Occupational History   ? None   Social Needs   ? Financial resource strain: None   ? Food insecurity     Worry: None     Inability: None   ? Transportation needs     Medical: None     Non-medical: None   Tobacco Use   ? Smoking status: Former Smoker     Packs/day: 0.25     Years: 8.00     Pack years: 2.00     Start date: 07/15/1957     Quit date: 07/15/1965     Years since quitting: 53.6   ? Smokeless tobacco: Never Used   Substance and Sexual Activity   ? Alcohol use: No     Comment: occasionally   ? Drug use: No   ? Sexual activity: None   Lifestyle   ? Physical activity     Days per week: None     Minutes per session: None   ? Stress: None   Relationships   ? Social Product manager on phone: None     Gets together: None     Attends religious service: None     Active member of club or organization: None     Attends meetings of clubs or organizations: None      Relationship status: None   Other Topics Concern   ? None   Social History Narrative   ? None     I have personally reviewed the patient's past medical, surgical history, family, & social histories.  I have reviewed any relevant past encounter notes, progress notes, and/or labs if they were available.      Review of Systems  Review of Systems   Constitutional: Negative.    HENT: Negative.    Respiratory: Positive for shortness of breath. Negative for cough and wheezing.    Cardiovascular: Negative.    Gastrointestinal: Negative.    Endocrine: Negative.    Neurological: Positive for weakness.   Psychiatric/Behavioral: Negative.      Physical Exam  VITAL SIGNS: BP 137/72 (BP Site: L Arm, BP Position: Sitting, BP Cuff Size: Large)   Pulse 67   Temp 35.6 C (96 F) (Temporal)   Resp 20   Ht 185.4 cm (6\' 1" )   Wt (!) 124 kg (273 lb 4.8 oz)   SpO2 94%   BMI 36.06 kg/m ;      02/04/19 1347   PainSc: 0-No pain     PHQ-9 Score                            GAD-7 Score:      Physical Exam   Constitutional: He is oriented to person, place, and time. He appears well-developed and well-nourished. No distress.   HENT:   Head: Normocephalic and atraumatic.   Right Ear: External ear normal.   Left Ear: External ear normal.   Nose: Nose normal.   Mouth/Throat: Oropharynx is clear and moist. No oropharyngeal exudate.   Eyes: Pupils are equal, round, and reactive to light. Conjunctivae and EOM are normal. Right  eye exhibits no discharge. Left eye exhibits no discharge. No scleral icterus.   Neck: Normal range of motion. Neck supple. No JVD present. No thyromegaly present.   Cardiovascular: Normal rate, regular rhythm, normal heart sounds and intact distal pulses. Exam reveals no gallop and no friction rub.   No murmur heard.  Pulmonary/Chest: Effort normal. No respiratory distress. He has no wheezes. He has no rales. He exhibits no tenderness.   Abdominal: Soft. Bowel sounds are normal. He exhibits no distension and no mass.  There is no abdominal tenderness. There is no rebound and no guarding.   Musculoskeletal: Normal range of motion.         General: No tenderness, deformity or edema.   Lymphadenopathy:     He has no cervical adenopathy.   Neurological: He is alert and oriented to person, place, and time. He has normal reflexes. No cranial nerve deficit. He exhibits normal muscle tone. Coordination normal.   Skin: Skin is warm and dry. No rash noted. He is not diaphoretic. No erythema. No pallor.   Psychiatric: He has a normal mood and affect. His behavior is normal. Judgment and thought content normal.   Nursing note and vitals reviewed.    Medical Decision Making and Plan  Patient characteristics, differential diagnosis, and current guidelines considered.  I have discussed the diagnosis, treatment, any prescriptions or therapies given and expected course of treatment.  The patient states understanding and willingness to follow the current treatment plan.    [facility] Components:     Medication adherence and barriers to the treatment plan have been addressed. Opportunities to optimize healthy behaviors have been discussed. Patient / caregiver voiced understanding.    Final Impression  Problem List Items Addressed This Visit       Nervous and Auditory    Polyneuropathy     Neuro referral given       Relevant Orders    Neurology      Other    Low vitamin B12 level - Primary     Injection given       DOE (dyspnea on exertion)     Pulmonary consult given       Relevant Orders    Ambulatory referral to Pulmonology    Pain in both lower extremities     Neurology referral given       Relevant Orders    Neurology       Return in about 3 months (around 05/07/2019) for [place].    This note was partially constructed using the [employer] (a voice recognition system) and may contain typographical errors missed during proofreading.    Jacqueline [address] [place]  [address]    Phone:  [phone]  Fax:  [phone]  Electronically signed by Janann August, MD at 02/09/2019  9:15 PM EDT

## 2019-02-09 NOTE — Assessment & Plan Note (Signed)
Associated Problem(s): Pain in both lower extremities  Formatting of this note might be different from the original.  Neurology referral given  Electronically signed by Janann August, MD at 02/09/2019  9:15 PM EDT

## 2019-02-09 NOTE — Assessment & Plan Note (Signed)
Associated Problem(s): Low vitamin B12 level  Formatting of this note might be different from the original.  Injection given  Electronically signed by Janann August, MD at 02/09/2019  9:14 PM EDT

## 2019-02-09 NOTE — Assessment & Plan Note (Signed)
Associated Problem(s): Polyneuropathy  Formatting of this note might be different from the original.  Neuro referral given  Electronically signed by Janann August, MD at 02/09/2019  9:15 PM EDT

## 2019-02-09 NOTE — Assessment & Plan Note (Signed)
Associated Problem(s): DOE (dyspnea on exertion)  Formatting of this note might be different from the original.  Pulmonary consult given  Electronically signed by Janann August, MD at 02/09/2019  9:14 PM EDT

## 2019-07-31 NOTE — Anesthesia Pre-Procedure Evaluation (Signed)
Formatting of this note is different from the original.  Procedure Information:   Date/Time: 08/03/19 0845    Procedure: CYSTOURETHROSCOPY, WITH URETEROSCOPY AND/OR PYELOSCOPY; WITH   LITHOTRIPSY INCLUDING INSERTION OF INDWELLING URETERAL STENT (N/A Ureter)    Anesthesia type: General    Pre-op diagnosis: ureteral stone    Location: OR 03 [facility] / [place]    Surgeons: Shary Key, MD       Anesthesia Evaluation    Patient summary reviewed  No history of anesthetic complications    Airway   Mallampati: II  TM distance: >3 FB  Neck ROM: full  Mouth Opening: normal and >3 FB    Dental - normal exam     Pulmonary      breath sounds clear to auscultation  (+) asthma, sleep apnea on CPAP,   (-) pneumonia, COPD, shortness of breath, recent URI    ROS comment: Former smoker  COVID negative  Cardiovascular   (+) hyperlipidemia,   (-) pacemaker, hypertension, valvular problems/murmurs, past MI, CAD, CABG/stent, dysrhythmias, angina, CHF    ECG reviewed  Rhythm: regular  Rate: normal  ROS comment: Cardiac cath (03/03/2019):  IMPRESSION OF HEART CATHETERIZATION:  1. Normal left main coronary artery.  2. Normal left anterior descending artery and its branches.  3. Normal left circumflex artery and its branches.  4. Normal right coronary artery.  5. Normal left ventricular systolic function. LVEDP 28mmHg. Ejection fraction 55%.    Neuro/Psych    (+) neuromuscular disease (polyneuropathy; tremor of left hand), psychiatric history (deprssion; anxiety)  GI/Hepatic/Renal    (+) chronic renal disease (nephrolithiasis),     Endo/Other    (+) hypothyroidism, ,malignancy (Prostate cancer)    Comments: obesity  Abdominal     Abdomen: soft.     HEENT    negative HEENT ROS        Patient current smoker?: No          Anesthesia Plan    ASA 3     Anesthetic:  General    Standard lines and monitors.    Type of induction: IV  Airway: endotracheal tube             Post Procedure Pain Management:IV analgesics and oral pain  medication.    Anesthesia plan and risk discussed with patient; informed consent obtained.    Use of blood products discussed with patient, whom consented to blood products.    Plan discussed with CRNA.    Relevant Problems   ANESTHESIA   (+) DOE (dyspnea on exertion)   (+) History of nephrolithiasis   (+) Mild intermittent asthma without complication   (+) OSA on CPAP   (+) Shortness of breath     Electronically signed by Elise Benne, MD at 08/03/2019  7:53 AM EST

## 2019-08-03 NOTE — Anesthesia Post-Procedure Evaluation (Signed)
Formatting of this note is different from the original.  Patient: Elijah Perry    Scheduled: Procedure(s):  CYSTOURETHOSCOPY, WITH URETEROSCOPY AND/OR PYELOSCOPY; DIAGNOSTIC  CHG X-RAY RETROGRADE PYELOGRAM  CYSTOURETHROSCOPY,  WITH INSERTION OF INDWELLING URETERAL STENT (EG, GIBBONS OR DOUBLE-J TYPE)   Anesthesia Start Date/Time: 08/03/19 0755    Procedures:       CYSTOURETHOSCOPY, WITH URETEROSCOPY AND/OR PYELOSCOPY; DIAGNOSTIC   (Left Ureter)      CHG X-RAY RETROGRADE PYELOGRAM (Left )      CYSTOURETHROSCOPY,  WITH INSERTION OF INDWELLING URETERAL STENT (EG,   GIBBONS OR DOUBLE-J TYPE) (Left )    Anesthesia type: general    Pre-op diagnosis: ureteral stone    Location: OR 03 [facility] / [place]    Surgeons: Shary Key, MD       Final Anesthesia Type: general    Anesthesia Staff: Anesthesiologist: Elise Benne, MD  Anesthesia Provider: Larene Beach, CRNA       Type Details Placement Removal    ETT Placement Date: 08/03/19; Placement Time: 0809; Pre O2/Mask induction: Preoxygenated with 100% O2 by face mask; Mask ventilation: oral airway used; Size: 7.5; Secured at: 22 cm; ETT Type: Oral; Cuffed: Cuffed; Insertion attempts: 1; View: II; Blade: MAC 4; Type of view: direct laryngoscopy; Airway Assistance: Anterior Laryngeal Pressure; Intubation Trauma: Atraumatic Placement; Placement Assessment: Positive ETCO2, Equal Bilateral Breath Sounds; Placed By: CRNA; Removal Date: 08/03/19; Removal Time: 0853 08/03/19 0809 by Larene Beach, CRNA 08/03/19 [phone] by Larene Beach, CRNA       Patient location: Perianesthesia  Last vitals:   Vitals Value Taken Time   BP 154/88 08/03/19 0914   Temp 36.7 C (98 F) 08/03/19 0859   Pulse 70 08/03/19 0913   Resp 12 08/03/19 0913   SpO2 91 % 08/03/19 0913   SpO2 Pulse 70 08/03/19 0913   Vitals shown include unvalidated device data.  Post vital signs: stable    Level of consciousness: awake, alert and oriented    PONV present?:  No  Post-anesthesia pain: adequate analgesia  Airway patency: patent  Respiratory: unassisted, spontaneous ventilation, room air  Cardiovascular: stable and blood pressure at baseline  Hydration: euvolemic  Anesthetic complications: No complications documented.    MIPS 430: Prevention of Post-Operative Nausea and Vomiting (PONV) - Combination Therapy - ADULT  Patients age 54 years and older who undergo a procedure under an inhalational general anesthetic and who have three or more risk  factors for PONV who receive at least two prophylactic anti-emetic agents of different classes preoperatively or intraoperatively.  Risk factors: Male gender , History of PONV, History of motion sickness, Non-smoker  Administration of opioids for post-op analgesia, given intraoperatively OR in PACU OR after discharge from PACU  4554F: Patient received inhalation anes agent and [id number]: Patient received at least 2 anti-emetic agents    _______________  Sheela Stack  08/03/19  Electronically signed by Elise Benne, MD at 08/03/2019 12:09 PM EST

## 2019-08-17 NOTE — Interval H&P Note (Signed)
Formatting of this note might be different from the original.  [facility]    H&P reviewed. The patient was examined and there are no changes to the H&P.      Electronically signed by Shary Key, MD at 08/17/2019  7:25 AM EST    Source Note - Shary Key, MD - 07/23/2019 11:30 AM EST  Formatting of this note is different from the original.  Name:  Elijah Perry  DOB: May 06, 1941  Today's Date: 07/23/2019  Age:  79 y.o.    Assessment:     Problem List Items Addressed This Visit       Genitourinary    Prostate cancer (CMS-HCC)    BPH with obstruction/lower urinary tract symptoms    Microscopic hematuria    Relevant Orders    CT Urogram    Overactive bladder      Other    Elevated PSA    Flank pain    Relevant Orders    CT Urogram    History of nephrolithiasis - Primary    Relevant Orders    POCT urinalysis dipstick (Completed)    CT Urogram       Plan:     Mr. Stutzman follows up today with a history of prostate cancer.  He reports that a prostate biopsy was performed around 2015 for an elevated PSA and that the biopsy was positive in 1 out of 12 cores for prostate cancer.  His prostate cancer was managed with active surveillance, he reports a repeat prostate biopsy a year later again revealed 1 out of 12 cores positive for prostate cancer.  His most recent PSA was 1.86 on 01/18/2019, down from 2.58 on 07/13/2018 down from 3.05 on 11/26/2017, down from 3.7 on 05/08/2017.  We will plan to recheck his PSA in 6 months.  To rule out any aggressive appearing lesions, I will order an MRI of the prostate.    He reports bothersome mixed lower urinary tract symptoms, likely related to BPH.  I started him on tamsulosin, with which he experienced improvement in his lower urinary tract symptoms.  He does report continued nocturia x2-3, not particularly bothersome.  I will offered him a trial of oxybutynin 5 mg nightly, he wishes to hold off at present.  His postvoid residual bladder scan indicated that he is  emptying his bladder to completion.    He recently reported issues with defecatory urgency and occasional constipation.  I recommended fiber supplementation.  He has been able to effectively control the symptoms with dietary changes.    He follows up today with recent onset of left flank pain and nausea, he reports a remote history nephrolithiasis.  He presented to the urgent care yesterday, microscopic hematuria was noted.  I will order a CT urogram to further assess.  I have prescribed Norco and ibuprofen to be taken as needed for pain.  I have encouraged increased fluid intake and will have him strain his urine.  We discussed that should he experience intolerable pain, intractable vomiting, or fevers to present to the emergency department immediately.    Providers:     Referring Physician:   Meredith Leeds, MD    PCP:   Meredith Leeds, MD    Subjective:     Chief Complaint:  Chief Complaint   Patient presents with   ? Follow-up   ? Nephrolithiasis     Pt states that his last stone was 40 years ago// Pt thinks  this is a new stones    ? Flank Pain     Lower left side   ? Hematuria     Mirco      HPI:   Elijah Perry is a 79 y.o. male who presents today with a h/o prostate cancer. He had a prostate biopsy around 2015 for an elevated PSA. He reports 1/12 cores was + for prostate cancer. His prostate cancer was managed with active surveillance. He reports having a repeat biopsy a year later which again he reports revealed 1/12 cores positive for prostate cancer.     He reports urinary frequency and nocturia x 3. He also reports urgency and urge incontinence. No gross hematuria here of late. No dysuria. He reports a diminished force of stream.      Past Medical History :     Past Medical History:   Diagnosis Date   ? Anxiety    ? At risk for falls    ? Depression    ? Disease of thyroid gland    ? Hyperlipidemia    ? Prostate CA (CMS-HCC)    ? Sleep apnea     Uses CPAP   ? SOB (shortness of breath)     ? Tremor of left hand    ? Tremors of nervous system      Past Surgical History:     Past Surgical History:   Procedure Laterality Date   ? COLONOSCOPY     ? EYE SURGERY      Retinal surgery   ? FRACTURE SURGERY     ? JOINT REPLACEMENT Right     hip, knee   ? PR CATH PLACE/CORON ANGIO, IMG SUPER/INTERP,W LEFT HEART VENTRICULOGRAPHY N/A 03/03/2019    Procedure: Left Heart Catheterization W Intervention;  Surgeon: Nash Shearer, MD;  Location: [facility] CATH;  Service: Cardiology   ? ROTATOR CUFF REPAIR Right    ? SKIN BIOPSY     ? TONSILLECTOMY       Medications:     Current Outpatient Medications:   ?  albuterol HFA 90 mcg/actuation inhaler, Inhale 2 puffs every six (6) hours as needed for wheezing., Disp: , Rfl:   ?  diclofenac (VOLTAREN) 75 MG EC tablet, Take 1 tablet (75 mg total) by mouth Two (2) times a day. TAKE 1 TABLET (75 MG TOTAL) BY MOUTH TWO (2) TIMES A DAY., Disp: 180 tablet, Rfl: 3  ?  escitalopram oxalate (LEXAPRO) 20 MG tablet, Take 1 tablet (20 mg total) by mouth daily., Disp: 90 tablet, Rfl: 3  ?  fluticasone furoate-vilanteroL (BREO ELLIPTA) 200-25 mcg/dose inhaler, Inhale 1 puff daily., Disp: 180 each, Rfl: 3  ?  levothyroxine (SYNTHROID) 50 MCG tablet, Take 1 tablet (50 mcg total) by mouth daily., Disp: 90 tablet, Rfl: 3  ?  promethazine (PHENERGAN) 25 MG tablet, Take 1 tablet (25 mg total) by mouth every six (6) hours as needed for nausea for up to 15 days., Disp: 60 tablet, Rfl: 0  ?  rosuvastatin (CRESTOR) 40 MG tablet, Take 1 tablet (40 mg total) by mouth daily., Disp: 90 tablet, Rfl: 3  ?  tamsulosin (FLOMAX) 0.4 mg capsule, TAKE 1 CAPSULE BY MOUTH EVERY DAY, Disp: 90 capsule, Rfl: 3  ?  HYDROcodone-acetaminophen (NORCO) 5-325 mg per tablet, Take 1 tablet by mouth every four (4) hours as needed., Disp: 20 tablet, Rfl: 0  ?  ibuprofen (MOTRIN) 600 MG tablet, Take 1 tablet (600 mg total) by mouth every eight (8)  hours as needed for pain., Disp: 30 tablet, Rfl: 0    Current  Facility-Administered Medications:   ?  cyanocobalamin injection 1,000 mcg, 1,000 mcg, Intramuscular, Q30 Days, Janann August, MD, 1,000 mcg at 09/03/18 1535    Allergies:     Allergies   Allergen Reactions   ? Adhesive Tape-Silicones Hives, Itching and Rash     Social History:     Social History     Socioeconomic History   ? Marital status: Married     Spouse name: None   ? Number of children: None   ? Years of education: None   ? Highest education level: None   Occupational History   ? None   Social Needs   ? Financial resource strain: None   ? Food insecurity     Worry: None     Inability: None   ? Transportation needs     Medical: None     Non-medical: None   Tobacco Use   ? Smoking status: Former Smoker     Packs/day: 0.25     Years: 8.00     Pack years: 2.00     Start date: 07/15/1957     Quit date: 07/15/1965     Years since quitting: 54.0   ? Smokeless tobacco: Never Used   Substance and Sexual Activity   ? Alcohol use: No     Comment: occasionally   ? Drug use: No   ? Sexual activity: None   Lifestyle   ? Physical activity     Days per week: None     Minutes per session: None   ? Stress: None   Relationships   ? Social Product manager on phone: None     Gets together: None     Attends religious service: None     Active member of club or organization: None     Attends meetings of clubs or organizations: None     Relationship status: None   Other Topics Concern   ? None   Social History Narrative   ? None     Family History:     Family History   Problem Relation Age of Onset   ? Heart disease Father    ? Heart attack Father    ? No Known Problems Mother      ROS:     Review of Systems   Constitutional: Negative for chills and fever.   HENT: Negative for congestion, facial swelling and sore throat.    Eyes: Negative for pain and visual disturbance.   Respiratory: Negative for cough and shortness of breath.    Cardiovascular: Negative for chest pain.   Gastrointestinal: Negative for abdominal  pain, nausea and vomiting.   Endocrine: Negative for cold intolerance, polydipsia and polyphagia.   Genitourinary:        See HPI   Musculoskeletal: Negative for arthralgias and joint swelling.   Skin: Negative for rash.   Allergic/Immunologic: Negative for immunocompromised state.   Neurological: Negative for seizures, weakness and numbness.   Hematological: Negative for adenopathy. Does not bruise/bleed easily.   Psychiatric/Behavioral: Negative for hallucinations and self-injury.     Vitals:     BP 139/98 (BP Site: L Arm, BP Position: Sitting, BP Cuff Size: Large)   Pulse 97   Temp 35.8 C (96.5 F) (Temporal)   Ht 185.4 cm (6\' 1" )   Wt (!) 124.8 kg (275 lb 3.2 oz)   SpO2 94%  BMI 36.31 kg/m     Objective:     Physical Exam   Constitutional: He is oriented to person, place, and time. He appears well-developed and well-nourished. He is cooperative.  Non-toxic appearance. No distress.   HENT:   Head: Normocephalic and atraumatic.   Right Ear: External ear normal.   Left Ear: External ear normal.   Nose: Nose normal.   Eyes: Conjunctivae and EOM are normal. No scleral icterus.   Neck: Normal range of motion and phonation normal. Neck supple. No tracheal deviation present.   Cardiovascular: Normal rate and regular rhythm.   No JVD noted, good capillary refill   Pulmonary/Chest: Effort normal. No accessory muscle usage. No respiratory distress.   Bilateral symmetrical rise and fall of the chest with inspiration and expiration   Abdominal: Soft. He exhibits no distension and no mass. There is no abdominal tenderness. No hernia.   Musculoskeletal: Normal range of motion.         General: No tenderness, deformity or edema.      Comments: No CVAT, No midline vertebral tenderness   Neurological: He is alert and oriented to person, place, and time. He has normal strength. No cranial nerve deficit.   Skin: Skin is warm, dry and intact. No rash noted.   Psychiatric: He has a normal mood and affect. His speech is  normal and behavior is normal. Judgment normal. His mood appears not anxious. He is not actively hallucinating.     Labs:     Results for orders placed or performed in visit on 07/23/19   POCT urinalysis dipstick   Result Value Ref Range    Color, UA Dark Yellow     Clarity, UA Clear     Glucose, UA Normal Negative    Bilirubin, UA Negative Negative    Ketones, POC Negative Negative    Spec Grav, UA 1.025 1.005 - 1.030    Blood, UA Negative Negative    pH, UA 5.0 5.0 - 9.0    Protein, UA Negative Negative    Urobilinogen, UA Normal Negative (0.2 mg/dL)    Leukocytes, UA Negative Negative    Nitrite, UA Negative Negative    STRIP LOT NUMBER [id number]     STRIP LOT EXPIRATION 2020-06-13      PSA (ng/mL)   Date Value   02/25/2019 2.18     Component      Latest Ref Rng & Units 05/08/2017 11/26/2017 07/13/2018   Prostate Specific Antigen      0.00 - 4.00 ng/mL 3.70 3.05 2.58     Imaging:     Bladder scan     Impression     PVR OF 0 ML           Orders:     Orders Placed This Encounter   Procedures   ? CT Urogram     Standing Status:   Future     Number of Occurrences:   1     Standing Expiration Date:   07/22/2020     Order Specific Question:   Reason for Exam:     Answer:   microscopic, remote h/o stones, recent onset of flank pain, please assess     Order Specific Question:   What is the patient's sedation requirement?     Answer:   No Sedation     Order Specific Question:   Performed at     Answer:   [facility]     Comments:   [facility]  Order Specific Question:   Does the patient have any history of allergic reaction during injection of IV contrast?     Answer:   No   ? POCT urinalysis dipstick     Shary Key, MD  Electronically signed by Shary Key, MD at 07/23/2019 11:15 AM EST

## 2019-08-17 NOTE — Unmapped (Signed)
Formatting of this note is different from the original.  CYSTOURETHROSCOPY, W/URETEROSCOPY &/OR PYELOSCOPY; W/REMOVAL/MANIPULATION CALCULUS(URETERAL CATH INCLUDED), CYSTOURETHROSCOPY, W/REMOVAL FOREIGN BODY, CALCULUS, OR URETERAL STENT FROM URETHRA OR BLADDER; COMPLICATED, CYSTOURETHROSCOPY,  WITH INSERTION OF INDWELLING URETERAL STENT (EG, GIBBONS OR DOUBLE-J TYPE), CHG X-RAY RETROGRADE PYELOGRAM  Operative Note (CSN: [id number])    Service    Date of Surgery: 08/17/2019  Admit Date: 08/17/2019  Performing Service: Urology  Surgeon(s) and Role:     * Shary Key, MD - Primary    Brief Op Note    Pre-op Diagnosis: Left ureteral stone, left nephrolithiasis    Post-op Diagnosis: Left ureteral stone, left nephrolithiasis    Note: Revisions to procedures should be made in chart - see Procedures tab.  Left - CYSTOURETHROSCOPY, W/URETEROSCOPY &/OR PYELOSCOPY; W/REMOVAL/MANIPULATION CALCULUS (modifier 22)    Left - CYSTOURETHROSCOPY, W/REMOVAL FOREIGN BODY, CALCULUS, OR URETERAL STENT FROM URETHRA OR BLADDER; COMPLICATED    Left - CYSTOURETHROSCOPY,  WITH INSERTION OF INDWELLING URETERAL STENT (EG, GIBBONS OR DOUBLE-J TYPE)    Left - CHG X-RAY RETROGRADE PYELOGRAM    Findings: Multiple small left renal stones with calcific debris within the left upper collecting tract, left distal ureteral stone    Anesthesia: General    Estimated Blood Loss: 5 mL    Complications: None    Specimens:   ID Type Source Tests Collected by Time Destination   A : left renal stone Renal Calculi Kidney, Left STONE ANALYSIS Shary Key, MD 08/17/2019 [phone]    B : left ureteral stone Renal Calculi Ureter, Left STONE ANALYSIS Shary Key, MD 08/17/2019 [phone]      Implants:   Implant Name Type Inv. Item Serial No. Manufacturer Lot No. LRB No. Used Action   STENT URETERAL [id number] PERCUFLEX PLUS - [id number] Stent STENT URETERAL [id number] [place] PLUS  [employer] [id number] N/A 1 Implanted     Indications for Surgery: The  patient recently presented with left flank pain. ACT scan revealeda 5 to 6 mm left proximal ureteral stone with hydronephrosis. We discussed the options of trial of passage versus ESWL versus ureteroscopic management and have decided to proceed with cystourethroscopy, left ureteroscopy with possible laser lithotripsy/basket extraction of stone, and left ureteral stent placement.  He underwent cystoscopy, left ureteroscopy, and left ureteral stent placement on 08/03/2019, his left ureteral stone was unable to be addressed due to relative stenosis of the left ureter.  He presents today for definitive ureteroscopic management of his left-sided stone burden after a period of time has elapsed to allow for passive dilation of the left ureter via his indwelling left ureteral stent. We reviewed the risk and benefits of the same, the risks including but not limited to bleeding, infectious complications, damage to structures in which we are working such as the urethra/bladder/left ureter &kidney, damage to adjacent structures, the possible need for multiple subsequent procedures to fully address his left-sided stone burden, and the possible need for subsequent treatments and/or procedures based on the findings of the procedure and the above-noted complications.We also reviewed the potential medical complications related to surgery and anesthesia, including but not limited to heart attack, stroke, complications related to blood clots, respiratory failure requiring prolonged ventilation, and even death. An opportunity was provided for questions, all of which were answered. He expresses understanding of the procedure and the associated risks and benefits and wishes to proceed.Informed consent was obtained.    Procedure: After informed consent was obtained, the patient was taken back to the  operating room and placed in the supine position on the operating room table.  General anesthesia was then induced.  He was then  carefully repositioned into dorsal lithotomy position and prepped and draped in standard sterile fashion.  Perioperative antibiotics were administered for prophylaxis and an active timeout was performed.  Next a 12 French rigid cystoscope was inserted via the urethral meatus and advanced under direct visualization into the bladder with a 30 degree lens in place.  Once within the bladder panendoscopy was performed with a 70 degree and then 30 degree lens.  Visualization within the bladder was limited due to hematuria.  The anterior urethra was unremarkable.  The posterior urethra revealed significantly obstructing lateral lobe hypertrophy with a high bladder neck with the median lobe extending into the bladder.  Within the bladder no suspicious lesions or masses were identified.  The ureteral orifices were notified and found to be in their normal orthotopic positions bilaterally.  A ureteral stent was emanating from the left ureteral orifice.  I attempted to grasped the distal curl of his left ureteral stent utilizing the 30 degree lens, however, I was unable to engage the stent due to his high bladder neck.  I then switched to the 70 degree lens with the Albarran bridge.  With difficulty I was eventually able to grasp the distal curl of the left ureteral stent with a stent grasper.  The bladder was emptied and the cystoscope and ureteral stent were retracted out through the urethral meatus.  I then advanced a sensor wire through the ureteral stent into the left upper collecting tract under fluoroscopic guidance.  The ureteral stent was removed from over the wire and discarded.  I then advanced a 10 French dual-lumen catheter with ease into the left proximal ureter under fluoroscopic guidance.  I then performed a left retrograde pyelogram.    Interpretation left retrograde pyelogram: On scout film no obvious calcifications are noted to overlie the left renal shadow or expected course of left ureter.  With a catheter  in the left proximal ureter contrast was gently instilled.  The left upper collecting tract exhibited mild dilatation.  No suspicious filling defects of the opacified collecting system were identified.    After left retrograde pyelogram was completed a second sensor wire was advanced through the dual-lumen catheter and into the left upper collecting tract under fluoroscopic guidance.  The dual-lumen catheter was then removed from over the wires.  I then advanced a 12 French obturator for a 14 French ureteral access sheath over one of the wires under fluoroscopic guidance into the left proximal ureter with ease.  Was then removed from over the wire.  The 41 French ureteral access sheath with obturator in place was then passed over the wire under fluoroscopic guidance with ease into the left proximal ureter.  The wire over which it was passed and the obturator were then removed.  I then advanced a flexible ureteroscope into the left upper collecting tract.  I then inspected the left upper collecting tract using the previously performed retrograde pyelogram as a guide.  There were several small stones, approximately 1 to 2 mm, within the calyces of the lower pole.  Calcific debris was noted throughout the left upper collecting tract.  Utilizing a 1.9, 0 tip nitinol basket I extracted the small stones.  The stones within the inferior most calyx of the lower pole were difficult to engage, requiring considerable effort to grasp the stones.  Eventually I was able to grasp  and remove the stones.  I reinspected the left upper collecting tract, no additional stones were identified.  I rinsed out the calcific debris as best as possible with the ureteroscope.  I then retracted the ureteroscope in tandem with the ureteral access sheath, inspecting the left ureter along its entirety.  Within the left distal ureter his ureteral stone was encountered and extracted.  A fair amount of edema of the urothelium surrounding the stone was  identified.  No other stones were encountered within the left ureter.  After the ureteroscope and ureteral access sheath were removed, the cystoscope was backloaded over the remaining wire and advanced under direct visualization into the bladder with a 30 degree lens in place.  A 6 x 28 left double-J ureteral stent was advanced over the wire under fluoroscopic guidance and cystoscopic visualization.  Once the stent was in good position, the wire was removed.  A good curl was noted within the left upper collecting tract on fluoroscopy.  A good curl was noted within the bladder on fluoroscopy and cystoscopy.  The bladder was emptied and the cystoscope was removed.  The patient tolerated the procedure well, there were no immediate complications.  He was awoken from anesthesia and taken to the recovery room in stable condition.  He will receive a short course of periprocedural antibiotics.  I will plan to see him back in the office in approximately 2 to 3 weeks for cystoscopy and stent removal.    Due to the location of the small stones within the inferior most calyx of the lower pole, engaging the stones and extracting them was considerably difficult, requiring approximately 85% more time and effort than the typical ureteroscopy with stone extraction.    Surgeon Notes: I performed the procedure    Shary Key   Date: 08/17/2019  Time: 8:48 AM  Electronically signed by Shary Key, MD at 08/17/2019  8:48 AM EST

## 2019-09-02 NOTE — Progress Notes (Signed)
Formatting of this note is different from the original.  Name:  Elijah Perry  DOB: June 16, 1941  Today's Date: 09/02/2019  Age:  79 y.o.    Assessment:     Problem List Items Addressed This Visit       Genitourinary    Prostate cancer (CMS-HCC)    BPH with obstruction/lower urinary tract symptoms    Overactive bladder    Uric acid nephrolithiasis - Primary    Relevant Medications    potassium citrate 15 mEq TbER    Hydronephrosis    Relevant Orders    US Renal Complete    Ureteral stone    Relevant Medications    potassium citrate 15 mEq TbER    Other Relevant Orders    POCT urinalysis dipstick (Completed)      Other    Elevated PSA    Flank pain       Plan:     Mr. Elijah Perry follows up today with a history of prostate cancer.  He reports that a prostate biopsy was performed around 2015 for an elevated PSA and that the biopsy was positive in 1 out of 12 cores for prostate cancer.  His prostate cancer was managed with active surveillance, he reports a repeat prostate biopsy a year later again revealed 1 out of 12 cores positive for prostate cancer.  His most recent PSA was 1.86 on 01/18/2019, down from 2.58 on 07/13/2018 down from 3.05 on 11/26/2017, down from 3.7 on 05/08/2017.  We will plan to recheck his PSA in 6 months.  To rule out any aggressive appearing lesions, I ordered an MRI of the prostate, no lesions consistent with clinically significant prostate cancer or identified.    He reports bothersome mixed lower urinary tract symptoms, likely related to BPH.  I started him on tamsulosin, with which he experienced improvement in his lower urinary tract symptoms.  He continued to experience nocturia x2-3, not particularly bothersome.  His postvoid residual bladder scan indicated that he is emptying his bladder to completion.  I will offered him a trial of oxybutynin 5 mg nightly, he wished to hold off for the time being.      He followed up with recent onset of left flank pain and nausea, he reported a history  nephrolithiasis.  A CT urogram revealed an obstructing 6 mm left proximal ureteral stone.  He is now status post ureteroscopic management of his left-sided stone burden on 08/17/2019.  His stent was removed today in the office without difficulty.    His stone analysis revealed 100% uric acid.  Given his recurrent nephrolithiasis we decided to initiate Urocit-K, we reviewed the potential side effects.  We have reviewed dietary recommendations for stone prevention, and I have provided him with literature regarding the same.    I will plan to see him back in approximately 8 weeks with a renal ultrasound prior to assess for resolution of his hydronephrosis.    Procedure:  Flexible cystoscopy, stent removal    Pre-Operative Diagnosis: Ureteral stone    Post-Operative Diagnosis: Ureteral stone    Anesthesia: local with lidocaine gel    Surgical Narrative:     After appropriate informed consent was obtained, the patient was prepped and draped in the usual sterile fashion in the supine position.  He was correctly identified and the proper procedure delineated prior to proceeding. Sterile lidocaine gel was instilled in the urethra.    The flexible cystoscope was introduced without difficulty. The left ureteral stent was  grasped and delivered intact per urethra. He tolerated the procedure well.    A chaperone was present during the procedure.    Providers:     Referring Physician:   Meredith Leeds, MD    PCP:   Meredith Leeds, MD    Subjective:     Chief Complaint:  Chief Complaint   Patient presents with   ? Follow-up     Cysto/Stent Removal     HPI:   Elijah Perry is a 79 y.o. male who presents today with a h/o prostate cancer. He had a prostate biopsy around 2015 for an elevated PSA. He reports 1/12 cores was + for prostate cancer. His prostate cancer was managed with active surveillance. He reports having a repeat biopsy a year later which again he reports revealed 1/12 cores positive for prostate cancer.      He reports urinary frequency and nocturia x 3. He also reports urgency and urge incontinence. No gross hematuria here of late. No dysuria. He reports a diminished force of stream.      Past Medical History :     Past Medical History:   Diagnosis Date   ? Anxiety    ? At risk for falls    ? Depression    ? Disease of thyroid gland    ? Hyperlipidemia    ? Kidney stones    ? Prostate CA (CMS-HCC)    ? Sleep apnea     Uses CPAP   ? SOB (shortness of breath)    ? Tremor of left hand    ? Tremors of nervous system      Past Surgical History:     Past Surgical History:   Procedure Laterality Date   ? CHG X-RAY RETROGRADE PYELOGRAM Left 08/03/2019    Procedure: CHG X-RAY RETROGRADE PYELOGRAM;  Surgeon: Shary Key, MD;  Location: [place];  Service: Urology   ? CHG X-RAY RETROGRADE PYELOGRAM Left 08/17/2019    Procedure: CHG X-RAY RETROGRADE PYELOGRAM;  Surgeon: Shary Key, MD;  Location: [place];  Service: Urology   ? COLONOSCOPY     ? EYE SURGERY      Retinal surgery   ? FRACTURE SURGERY     ? JOINT REPLACEMENT Right     hip, knee   ? PR CATH PLACE/CORON ANGIO, IMG SUPER/INTERP,W LEFT HEART VENTRICULOGRAPHY N/A 03/03/2019    Procedure: Left Heart Catheterization W Intervention;  Surgeon: Nash Shearer, MD;  Location: [facility] CATH;  Service: Cardiology   ? PR CYSTO/URETERO/PYELOSCOPY, CALCULUS TX Left 08/17/2019    Procedure: CYSTOURETHROSCOPY, W/URETEROSCOPY &/OR PYELOSCOPY; W/REMOVAL/MANIPULATION CALCULUS(URETERAL CATH INCLUDED);  Surgeon: Shary Key, MD;  Location: [place];  Service: Urology   ? PR CYSTO/URETERO/PYELOSCOPY, DX Left 08/03/2019    Procedure: CYSTOURETHOSCOPY, WITH URETEROSCOPY AND/OR PYELOSCOPY; DIAGNOSTIC;  Surgeon: Shary Key, MD;  Location: [place];  Service: Urology   ? PR CYSTOSCOPY,INSERT URETERAL STENT Left 08/03/2019    Procedure: CYSTOURETHROSCOPY,  WITH INSERTION OF INDWELLING URETERAL STENT (EG, GIBBONS OR DOUBLE-J TYPE);  Surgeon:  Shary Key, MD;  Location: [place];  Service: Urology   ? PR CYSTOSCOPY,INSERT URETERAL STENT Left 08/17/2019    Procedure: CYSTOURETHROSCOPY,  WITH INSERTION OF INDWELLING URETERAL STENT (EG, GIBBONS OR DOUBLE-J TYPE);  Surgeon: Shary Key, MD;  Location: [place];  Service: Urology   ? PR CYSTOSCOPY,REMV CALCULUS,COMPLIC Left [id number]    Procedure: CYSTOURETHROSCOPY, W/REMOVAL FOREIGN BODY, CALCULUS, OR  URETERAL STENT FROM URETHRA OR BLADDER; COMPLICATED;  Surgeon: Shary Key, MD;  Location: [place];  Service: Urology   ? ROTATOR CUFF REPAIR Right    ? SKIN BIOPSY     ? TONSILLECTOMY       Medications:     Current Outpatient Medications:   ?  albuterol HFA 90 mcg/actuation inhaler, Inhale 2 puffs every six (6) hours as needed for wheezing., Disp: , Rfl:   ?  diclofenac (VOLTAREN) 75 MG EC tablet, Take 1 tablet (75 mg total) by mouth Two (2) times a day. TAKE 1 TABLET (75 MG TOTAL) BY MOUTH TWO (2) TIMES A DAY., Disp: 180 tablet, Rfl: 3  ?  docusate sodium (COLACE) 100 MG capsule, Take 1 capsule (100 mg total) by mouth two (2) times a day as needed for constipation., Disp: 60 capsule, Rfl: 0  ?  escitalopram oxalate (LEXAPRO) 20 MG tablet, Take 1 tablet (20 mg total) by mouth daily., Disp: 90 tablet, Rfl: 3  ?  fluticasone furoate-vilanteroL (BREO ELLIPTA) 200-25 mcg/dose inhaler, Inhale 1 puff daily., Disp: 180 each, Rfl: 3  ?  HYDROcodone-acetaminophen (NORCO) 5-325 mg per tablet, Take 1 tablet by mouth every four (4) hours as needed for pain., Disp: 10 tablet, Rfl: 0  ?  ibuprofen (MOTRIN) 600 MG tablet, Take 1 tablet (600 mg total) by mouth every eight (8) hours as needed for pain., Disp: 30 tablet, Rfl: 0  ?  levothyroxine (SYNTHROID) 50 MCG tablet, Take 1 tablet (50 mcg total) by mouth daily., Disp: 90 tablet, Rfl: 3  ?  oxybutynin (DITROPAN) 5 MG tablet, TAKE 1 TABLET (5 MG TOTAL) BY MOUTH TWO (2) TIMES A DAY AS NEEDED (URINARY FREQUENCY/URGENCY, BLADDER SPASMS).,  Disp: 60 tablet, Rfl: 0  ?  rosuvastatin (CRESTOR) 40 MG tablet, Take 1 tablet (40 mg total) by mouth daily., Disp: 90 tablet, Rfl: 3  ?  tamsulosin (FLOMAX) 0.4 mg capsule, TAKE 1 CAPSULE BY MOUTH EVERY DAY, Disp: 90 capsule, Rfl: 3  ?  potassium citrate 15 mEq TbER, Take 1 tablet by mouth 2 (two) times a day with meals., Disp: 180 tablet, Rfl: 3    Current Facility-Administered Medications:   ?  cyanocobalamin injection 1,000 mcg, 1,000 mcg, Intramuscular, Q30 Days, Janann August, MD, 1,000 mcg at 09/03/18 1535    Allergies:     Allergies   Allergen Reactions   ? Adhesive Tape-Silicones Hives, Itching and Rash     Social History:     Social History     Socioeconomic History   ? Marital status: Married     Spouse name: None   ? Number of children: None   ? Years of education: None   ? Highest education level: None   Occupational History   ? None   Social Needs   ? Financial resource strain: None   ? Food insecurity     Worry: None     Inability: None   ? Transportation needs     Medical: None     Non-medical: None   Tobacco Use   ? Smoking status: Former Smoker     Packs/day: 0.25     Years: 8.00     Pack years: 2.00     Start date: 07/15/1957     Quit date: 07/15/1965     Years since quitting: 54.1   ? Smokeless tobacco: Never Used   Substance and Sexual Activity   ? Alcohol use: No     Comment:  occasionally   ? Drug use: No   ? Sexual activity: None   Lifestyle   ? Physical activity     Days per week: None     Minutes per session: None   ? Stress: None   Relationships   ? Social Product manager on phone: None     Gets together: None     Attends religious service: None     Active member of club or organization: None     Attends meetings of clubs or organizations: None     Relationship status: None   Other Topics Concern   ? None   Social History Narrative   ? None     Family History:     Family History   Problem Relation Age of Onset   ? Heart disease Father    ? Heart attack Father    ? No Known  Problems Mother      ROS:     Review of Systems   Constitutional: Negative for chills and fever.   HENT: Negative for congestion, facial swelling and sore throat.    Eyes: Negative for pain and visual disturbance.   Respiratory: Negative for cough and shortness of breath.    Cardiovascular: Negative for chest pain.   Gastrointestinal: Negative for abdominal pain, nausea and vomiting.   Endocrine: Negative for cold intolerance, polydipsia and polyphagia.   Genitourinary:        See HPI   Musculoskeletal: Negative for arthralgias and joint swelling.   Skin: Negative for rash.   Allergic/Immunologic: Negative for immunocompromised state.   Neurological: Negative for seizures, weakness and numbness.   Hematological: Negative for adenopathy. Does not bruise/bleed easily.   Psychiatric/Behavioral: Negative for hallucinations and self-injury.     Vitals:     BP 117/75 (BP Site: L Arm, BP Position: Sitting, BP Cuff Size: Large)   Pulse 96   Temp 36.3 C (97.3 F) (Temporal)   Ht 185.4 cm (6\' 1" )   Wt (!) 123.3 kg (271 lb 12.8 oz)   SpO2 95%   BMI 35.86 kg/m     Objective:     Physical Exam   Constitutional: He is oriented to person, place, and time. He appears well-developed and well-nourished. He is cooperative.  Non-toxic appearance. No distress.   HENT:   Head: Normocephalic and atraumatic.   Right Ear: External ear normal.   Left Ear: External ear normal.   Nose: Nose normal.   Eyes: Conjunctivae and EOM are normal. No scleral icterus.   Neck: Normal range of motion and phonation normal. Neck supple. No tracheal deviation present.   Cardiovascular: Normal rate and regular rhythm.   No JVD noted, good capillary refill   Pulmonary/Chest: Effort normal. No accessory muscle usage. No respiratory distress.   Bilateral symmetrical rise and fall of the chest with inspiration and expiration   Abdominal: Soft. He exhibits no distension and no mass. There is no abdominal tenderness. No hernia.   Musculoskeletal: Normal  range of motion.         General: No tenderness, deformity or edema.      Comments: No CVAT, No midline vertebral tenderness   Neurological: He is alert and oriented to person, place, and time. He has normal strength. No cranial nerve deficit.   Skin: Skin is warm, dry and intact. No rash noted.   Psychiatric: He has a normal mood and affect. His speech is normal and behavior is normal. Judgment normal. His  mood appears not anxious. He is not actively hallucinating.     Labs:     Results for orders placed or performed in visit on 09/02/19   POCT urinalysis dipstick   Result Value Ref Range    Color, UA Dark Yellow     Clarity, UA Cloudy     Glucose, UA Normal Negative    Bilirubin, UA Negative Negative    Ketones, POC Negative Negative    Spec Grav, UA 1.020 1.005 - 1.030    Blood, UA 250 mg/dL Negative    pH, UA 5.0 5.0 - 9.0    Protein, UA 30 mg/dL Negative    Urobilinogen, UA Normal Negative (0.2 mg/dL)    Leukocytes, UA 500 mg/dL Negative    Nitrite, UA Negative Negative    STRIP LOT NUMBER [id number]     STRIP LOT EXPIRATION 2020-03-14      PSA (ng/mL)   Date Value   02/25/2019 2.18     Component      Latest Ref Rng & Units 05/08/2017 11/26/2017 07/13/2018   Prostate Specific Antigen      0.00 - 4.00 ng/mL 3.70 3.05 2.58     Stone Analysis  Order: [id number]  Status:  Final result Visible to patient:  Yes (MyChart) Next appt:  09/03/2019 at 10:30 AM in Internal Medicine ([facility] PROVIDER)  Component 08/17/19 0829   Stone Source Left Ureter    Kidney Stone Analysis Interp 100% Uric acid          Imaging:     Reading Physician Reading Date Result Priority   Jacelyn Pi, MD  [phone] 07/26/2019      Narrative & Impression     Exam:  CT Urogram (3 Phase Protocol)    History:  Microscopic hematuria, left flank pain, nausea    Technique:  Pre-contrast CT was performed through the abdomen and pelvis to include the entire urinary tract (kidneys through bladder).  CT was then performed  through the abdomen during nephrographic phase of enhancement of the kidneys with IV contrast. Delayed, post-contrast CT was then performed through the abdomen and pelvis (to include the entire urinary tract) during the excretory phase of enhancement. This examination was tailored to optimize evaluation of the urinary tract. No oral contrast was administered.  IV contrast:  150 mL Omnipaque 300.  DLP:  [id number] mGy-cm.  AEC (automated exposure control) and/or manual techniques such as size-specific kV and mAs are employed where appropriate to reduce radiation exposure for all CT exams. I-STAT creatinine level --  2.5 mg/dl.      Comparison:  Pelvis MRI from January 29, 2019    Abdomen and Pelvis Findings:    KIDNEYS/URETERS:  The left kidney demonstrates pelvocaliectasis and proximal ureterectasis extending to the mid ureter where there is an obstructing stone measuring 4.9 x 4.1 x 5.7 mm (image 74, series 2). Perinephric stranding is noted on the left.    Several additional nonobstructive intrarenal calculi are noted including a 2 mm calculus in the lower left kidney, and 2 punctate 1 to 2 mm calculi in the mid right kidney. No right ureteral calculi or hydronephrosis.    The left kidney demonstrates diminished, delayed enhancement after contrast administration. No solid masses are noted. There is a benign simple cyst in the posterior left mid kidney measuring 8 mm. Mild urothelial thickening and hyperenhancement are noted on the left in the renal pelvis and proximal ureter.    Delayed images demonstrate no  additional filling defects in the renal collecting systems. There is no excretion on the left kidney at the time of delayed imaging.    BLADDER:  The urinary bladder is decompressed and demonstrates a thickened wall, unchanged. No definite filling defects.    LOWER THORAX:  The lung bases demonstrate minimal dependent atelectasis. No effusions or consolidation. Heart size is normal.    HEPATOBILIARY:  Minimal  steatosis. The hepatic capsule is smooth. No focal liver lesions. Several calcified gallstones are noted in the gallbladder neck region. No evidence of acute cholecystitis.    PANCREAS:  Normal.    SPLEEN:  Normal.    ADRENALS:  Normal.    VASCULAR:  Atherosclerotic plaquing is present in the abdominal aorta without aneurysm. The IVC is normal.    LYMPH NODES:  No adenopathy.    BOWEL/MESENTERY:  The stomach, duodenum, small bowel, and colon are normal in caliber without dilatation or thickening. The appendix is visualized and is normal. No mesenteric masses or fluid collections. Sigmoid diverticuli are noted with no acute diverticulitis.    PELVIC ORGANS:  The prostate gland is enlarged. No pelvic free fluid.    BONES/SOFT TISSUES:  There are postoperative changes from right hip arthroplasty. Moderate degenerative changes in the lumbar spine are noted.    IMPRESSION:  1. Obstructing stone in the proximal to mid left ureter measuring 5 x 4 x 6 mm, with associated hypoperfusion of the left kidney, perinephric and periureteric stranding, and proximal urothelial thickening and hyperenhancement. Superimposed upper urinary tract infection is not excluded. Correlate clinically.  2. Bilateral punctate nonobstructing intrarenal calculi as described above.  3. Benign simple cyst in the posterior left mid kidney. No solid masses are noted.  4. Prostatic enlargement.  5. Decompressed, thick-walled urinary bladder. Question chronic bladder outlet obstruction.  6. Sigmoid diverticulosis with no acute diverticulitis. Normal appendix. Hepatic steatosis. Cholelithiasis with no acute cholecystitis.      Reading Physician Reading Date Result Priority   Kingsley Plan, MD  [phone] 01/29/2019      Narrative & Impression     EXAMINATION: MAGNETIC RESONANCE IMAGING OF THE PELVIS WITHOUT AND WITH CONTRAST        HISTORY: Prostate cancer. Pathology results from a prostate biopsy performed 09/19/2015 demonstrated Gleason 3+3 = 6  disease within the left apex. Please note Gleason 6 disease can be occult on imaging.    TECHNIQUE: MR imaging of the prostate gland was performed prior to and following administration of intravenous gadolinium.   Protocol: Prostate  Contrast: 10 ml Gadavist      FINDINGS:  The prostate transition zone is enlarged with benign prostatic hyperplasia.    Susceptibility artifact from the right hip arthroplasty renders diffusion-weighted imaging nondiagnostic. T2-weighted imaging was degraded but still diagnostic.    Per the PI-RADS 2 scoring system ([employer]), there are no suspicious lesions in the prostate.    Staging Information: The seminal vesicles appear symmetric. No tumor involvement identified. The membranous urethra length is approximately 1.2 cm on series 2 image 11.    Prostate volume: 79.29 mL      Other Findings: Susceptibility artifact is present from the right hip arthroplasty. This limits assessment of the right hemipelvis. The left hemipelvic dominant arteries and veins are patent. No definitive pelvic lymphadenopathy. No suspicious marrow replacing lesion. Colonic diverticulosis. Urinary bladder wall thickening, likely secondary to a chronic outlet obstruction. Fat-containing right inguinal hernia. Synovitis of the L5-S1 facet joints.      IMPRESSION:  1. Enlarged prostate with findings consistent with benign prostate hyperplasia.  2. No focal lesion identified meeting criteria for suspicion per PI-RADS. Of note Gleason 6 disease can be occult on imaging.     Bladder scan     Impression     PVR OF 0 ML           Orders:     Orders Placed This Encounter   Procedures   ? US Renal Complete     Standing Status:   Future     Number of Occurrences:   1     Standing Expiration Date:   09/01/2020     Order Specific Question:   Reason for Exam:     Answer:   Ureteral stone with hydronephrosis status post ureteroscopic management, please assess for resolution of  hydronephrosis     Order Specific Question:   Performed at     Answer:   [facility]     Comments:   [facility]   ? POCT urinalysis dipstick     Shary Key, MD  Electronically signed by Shary Key, MD at 09/02/2019  2:24 PM EST

## 2019-09-02 NOTE — Addendum Note (Signed)
Addended by: Leota Sauers on: 09/02/2019 02:43 PM     Modules accepted: Orders      Electronically signed by Inetta Fermo, CMA at 09/02/2019  2:43 PM EST

## 2019-11-01 NOTE — Progress Notes (Signed)
Formatting of this note is different from the original.  Name:  Elijah Perry  DOB: 1940-08-17  Today's Date: 11/01/2019  Age:  79 y.o.    Assessment:     Problem List Items Addressed This Visit       Genitourinary    Prostate cancer (CMS-HCC)    Relevant Orders    POCT urinalysis dipstick (Completed)    PSA (Prostate Specific Antigen)    PSA (Prostate Specific Antigen)    BPH with obstruction/lower urinary tract symptoms    Relevant Orders    POCT urinalysis dipstick (Completed)    Bladder scan (Completed)    Overactive bladder - Primary    Uric acid nephrolithiasis    Relevant Orders    POCT urinalysis dipstick (Completed)    Recurrent UTI    RESOLVED: Hydronephrosis    Relevant Orders    POCT urinalysis dipstick (Completed)    RESOLVED: Ureteral stone    Relevant Orders    POCT urinalysis dipstick (Completed)      Other    Elevated PSA    Relevant Orders    POCT urinalysis dipstick (Completed)    Bladder scan (Completed)    RESOLVED: Flank pain    Relevant Orders    POCT urinalysis dipstick (Completed)       Plan:     Mr. Simonet follows up today with a history of prostate cancer.  He reports that a prostate biopsy was performed around 2015 for an elevated PSA and that the biopsy was positive in 1 out of 12 cores for prostate cancer.  His prostate cancer was managed with active surveillance, he reports a repeat prostate biopsy a year later again revealed 1 out of 12 cores positive for prostate cancer.  His most recent PSA was 2.18 on 02/25/2019. To rule out any aggressive appearing lesions, I ordered an MRI of the prostate, no lesions consistent with clinically significant prostate cancer or identified.    He has a history of bothersome mixed lower urinary tract symptoms, likely related to BPH.  I started him on tamsulosin, with which he experienced improvement in his lower urinary tract symptoms.  He continued to experience nocturia x2-3.  His postvoid residual bladder scan indicated that he is emptying his  bladder to completion.  I will prescribe oxybutynin 5 mg nightly, we reviewed the potential side effects.      He followed up with recent onset of left flank pain and nausea, he reported a history nephrolithiasis.  A CT urogram revealed an obstructing 6 mm left proximal ureteral stone.  He is now status post ureteroscopic management of his left-sided stone burden on 08/17/2019.  His stent was removed postoperatively in the office without difficulty.  A follow-up renal ultrasound revealed resolution of his hydronephrosis.    His stone analysis revealed 100% uric acid.  Given his recurrent nephrolithiasis we decided to initiate Urocit-K, we reviewed the potential side effects.  We have reviewed dietary recommendations for stone prevention, and I have provided him with literature regarding the same.    After his recent stone procedure he has experienced recurrent urinary tract infections which have been treated, I have prescribed a 81-month course of nitrofurantoin prophylaxis.  His urinalysis today is generally unremarkable.    RTC 6 months with PSA prior.    Providers:     Referring Physician:   Meredith Leeds, MD    PCP:   Meredith Leeds, MD    Subjective:     Chief  Complaint:  Chief Complaint   Patient presents with   ? Follow-up     HPI:   Elijah Perry is a 79 y.o. male who presents today with a h/o prostate cancer. He had a prostate biopsy around 2015 for an elevated PSA. He reports 1/12 cores was + for prostate cancer. His prostate cancer was managed with active surveillance. He reports having a repeat biopsy a year later which again he reports revealed 1/12 cores positive for prostate cancer.     He reports urinary frequency and nocturia x 3. He also reports urgency and urge incontinence. No gross hematuria here of late. No dysuria. He reports a diminished force of stream.      Past Medical History :     Past Medical History:   Diagnosis Date   ? Anxiety    ? At risk for falls    ? Depression     ? Disease of thyroid gland    ? Hyperlipidemia    ? Kidney stones    ? Prostate CA (CMS-HCC)    ? Sleep apnea     Uses CPAP   ? SOB (shortness of breath)    ? Tremor of left hand    ? Tremors of nervous system      Past Surgical History:     Past Surgical History:   Procedure Laterality Date   ? CHG X-RAY RETROGRADE PYELOGRAM Left 08/03/2019    Procedure: CHG X-RAY RETROGRADE PYELOGRAM;  Surgeon: Shary Key, MD;  Location: [place];  Service: Urology   ? CHG X-RAY RETROGRADE PYELOGRAM Left 08/17/2019    Procedure: CHG X-RAY RETROGRADE PYELOGRAM;  Surgeon: Shary Key, MD;  Location: [place];  Service: Urology   ? COLONOSCOPY     ? EYE SURGERY      Retinal surgery   ? FRACTURE SURGERY     ? JOINT REPLACEMENT Right     hip, knee   ? PR CATH PLACE/CORON ANGIO, IMG SUPER/INTERP,W LEFT HEART VENTRICULOGRAPHY N/A 03/03/2019    Procedure: Left Heart Catheterization W Intervention;  Surgeon: Nash Shearer, MD;  Location: [facility] CATH;  Service: Cardiology   ? PR CYSTO/URETERO/PYELOSCOPY, CALCULUS TX Left 08/17/2019    Procedure: CYSTOURETHROSCOPY, W/URETEROSCOPY &/OR PYELOSCOPY; W/REMOVAL/MANIPULATION CALCULUS(URETERAL CATH INCLUDED);  Surgeon: Shary Key, MD;  Location: [place];  Service: Urology   ? PR CYSTO/URETERO/PYELOSCOPY, DX Left 08/03/2019    Procedure: CYSTOURETHOSCOPY, WITH URETEROSCOPY AND/OR PYELOSCOPY; DIAGNOSTIC;  Surgeon: Shary Key, MD;  Location: [place];  Service: Urology   ? PR CYSTOSCOPY,INSERT URETERAL STENT Left 08/03/2019    Procedure: CYSTOURETHROSCOPY,  WITH INSERTION OF INDWELLING URETERAL STENT (EG, GIBBONS OR DOUBLE-J TYPE);  Surgeon: Shary Key, MD;  Location: [place];  Service: Urology   ? PR CYSTOSCOPY,INSERT URETERAL STENT Left 08/17/2019    Procedure: CYSTOURETHROSCOPY,  WITH INSERTION OF INDWELLING URETERAL STENT (EG, GIBBONS OR DOUBLE-J TYPE);  Surgeon: Shary Key, MD;  Location: [place];  Service:  Urology   ? PR CYSTOSCOPY,REMV CALCULUS,COMPLIC Left [id number]    Procedure: CYSTOURETHROSCOPY, W/REMOVAL FOREIGN BODY, CALCULUS, OR URETERAL STENT FROM URETHRA OR BLADDER; COMPLICATED;  Surgeon: Shary Key, MD;  Location: [place];  Service: Urology   ? ROTATOR CUFF REPAIR Right    ? SKIN BIOPSY     ? TONSILLECTOMY       Medications:     Current Outpatient Medications:   ?  diclofenac (VOLTAREN) 75 MG  EC tablet, TAKE 1 TABLET BY MOUTH  TWICE DAILY, Disp: 180 tablet, Rfl: 3  ?  escitalopram oxalate (LEXAPRO) 20 MG tablet, TAKE 1 TABLET BY MOUTH  DAILY, Disp: 90 tablet, Rfl: 3  ?  fluticasone furoate-vilanteroL (BREO ELLIPTA) 200-25 mcg/dose inhaler, Inhale 1 puff daily., Disp: 180 each, Rfl: 3  ?  ibuprofen (MOTRIN) 600 MG tablet, Take 1 tablet (600 mg total) by mouth every eight (8) hours as needed for pain., Disp: 30 tablet, Rfl: 0  ?  levothyroxine (SYNTHROID) 50 MCG tablet, TAKE 1 TABLET BY MOUTH  DAILY, Disp: 90 tablet, Rfl: 3  ?  nitrofurantoin (MACRODANTIN) 50 MG capsule, Take 1 capsule (50 mg total) by mouth nightly. Patient should start nitrofurantoin nightly once patient completes the Macrobid twice daily x 14 days., Disp: 90 capsule, Rfl: 0  ?  potassium citrate 15 mEq TbER, Take 1 tablet by mouth 2 (two) times a day with meals., Disp: 180 tablet, Rfl: 3  ?  rosuvastatin (CRESTOR) 40 MG tablet, TAKE 1 TABLET BY MOUTH  DAILY, Disp: 90 tablet, Rfl: 3  ?  tamsulosin (FLOMAX) 0.4 mg capsule, TAKE 1 CAPSULE BY MOUTH EVERY DAY, Disp: 90 capsule, Rfl: 3  ?  HYDROcodone-acetaminophen (NORCO) 5-325 mg per tablet, Take 1 tablet by mouth every four (4) hours as needed for pain. (Patient not taking: Reported on 11/01/2019), Disp: 10 tablet, Rfl: 0  ?  oxybutynin (DITROPAN) 5 MG tablet, Take 1 tablet (5 mg total) by mouth nightly., Disp: 90 tablet, Rfl: 3    Current Facility-Administered Medications:   ?  cyanocobalamin injection 1,000 mcg, 1,000 mcg, Intramuscular, Q30 Days, Janann August,  MD, 1,000 mcg at 09/03/18 1535    Allergies:     Allergies   Allergen Reactions   ? Adhesive Tape-Silicones Hives, Itching and Rash     Social History:     Social History     Socioeconomic History   ? Marital status: Married     Spouse name: None   ? Number of children: None   ? Years of education: None   ? Highest education level: None   Occupational History   ? None   Tobacco Use   ? Smoking status: Former Smoker     Packs/day: 0.25     Years: 8.00     Pack years: 2.00     Start date: 07/15/1957     Quit date: 07/15/1965     Years since quitting: 54.3   ? Smokeless tobacco: Never Used   Vaping Use   ? Vaping Use: Never used   Substance and Sexual Activity   ? Alcohol use: No     Comment: occasionally   ? Drug use: No   ? Sexual activity: None   Other Topics Concern   ? None   Social History Narrative   ? None     Social Determinants of Health     Financial Resource Strain:    ? Difficulty of Paying Living Expenses:    Food Insecurity:    ? Worried About Charity fundraiser in the Last Year:    ? Arboriculturist in the Last Year:    Transportation Needs:    ? Film/video editor (Medical):    ? Lack of Transportation (Non-Medical):    Physical Activity:    ? Days of Exercise per Week:    ? Minutes of Exercise per Session:    Stress:    ? Feeling of Stress :  Social Connections:    ? Frequency of Communication with Friends and Family:    ? Frequency of Social Gatherings with Friends and Family:    ? Attends Religious Services:    ? Active Member of Clubs or Organizations:    ? Attends Archivist Meetings:    ? Marital Status:      Family History:     Family History   Problem Relation Age of Onset   ? Heart disease Father    ? Heart attack Father    ? No Known Problems Mother      ROS:     Review of Systems   Constitutional: Negative for chills and fever.   HENT: Negative for congestion, facial swelling and sore throat.    Eyes: Negative for pain and visual disturbance.   Respiratory: Negative for cough  and shortness of breath.    Cardiovascular: Negative for chest pain.   Gastrointestinal: Negative for abdominal pain, nausea and vomiting.   Endocrine: Negative for cold intolerance, polydipsia and polyphagia.   Genitourinary:        See HPI   Musculoskeletal: Negative for arthralgias and joint swelling.   Skin: Negative for rash.   Allergic/Immunologic: Negative for immunocompromised state.   Neurological: Negative for seizures, weakness and numbness.   Hematological: Negative for adenopathy. Does not bruise/bleed easily.   Psychiatric/Behavioral: Negative for hallucinations and self-injury.     Vitals:     BP 115/75 (BP Site: R Arm, BP Position: Sitting, BP Cuff Size: Large)   Pulse 82   Temp 35.8 C (96.4 F) (Temporal)   Ht 185.4 cm (6\' 1" )   Wt (!) 125.6 kg (276 lb 12.8 oz)   SpO2 94%   BMI 36.52 kg/m     Objective:     Physical Exam  Constitutional:       General: He is not in acute distress.     Appearance: He is well-developed. He is not toxic-appearing.   HENT:      Head: Normocephalic and atraumatic.      Right Ear: External ear normal.      Left Ear: External ear normal.      Nose: Nose normal.   Eyes:      General: No scleral icterus.     Conjunctiva/sclera: Conjunctivae normal.   Neck:      Trachea: Phonation normal. No tracheal deviation.   Cardiovascular:      Rate and Rhythm: Normal rate and regular rhythm.      Comments: No JVD noted, good capillary refill  Pulmonary:      Effort: Pulmonary effort is normal. No accessory muscle usage or respiratory distress.   Abdominal:      General: There is no distension.      Palpations: Abdomen is soft. There is no mass.      Tenderness: There is no abdominal tenderness.      Hernia: No hernia is present.   Musculoskeletal:         General: No tenderness or deformity. Normal range of motion.      Cervical back: Normal range of motion and neck supple.      Comments: No CVAT, No midline vertebral tenderness   Skin:     General: Skin is warm and dry.       Findings: No rash.   Neurological:      Mental Status: He is alert and oriented to person, place, and time.      Cranial Nerves:  No cranial nerve deficit.   Psychiatric:         Mood and Affect: Mood is not anxious.         Speech: Speech normal.         Behavior: Behavior normal. Behavior is cooperative.         Judgment: Judgment normal.     Labs:     Results for orders placed or performed in visit on 11/01/19   POCT urinalysis dipstick   Result Value Ref Range    Color, UA Yellow     Clarity, UA Clear     Glucose, UA Normal Negative    Bilirubin, UA Negative Negative    Ketones, POC Negative Negative    Spec Grav, UA 1.020 1.005 - 1.030    Blood, UA Trace Negative    pH, UA 5.0 5.0 - 9.0    Protein, UA Negative Negative    Urobilinogen, UA Normal Negative (0.2 mg/dL)    Leukocytes, UA Negative Negative    Nitrite, UA Negative Negative    STRIP LOT NUMBER [id number] [id number]     STRIP LOT EXPIRATION 2020-06-13      PSA (ng/mL)   Date Value   02/25/2019 2.18     Component      Latest Ref Rng & Units 05/08/2017 11/26/2017 07/13/2018 01/18/2019   Prostate Specific Antigen      0.00 - 4.00 ng/mL 3.70 3.05 2.58 1.86     Stone Analysis  Order: [id number]  Status:  Final result Visible to patient:  Yes (MyChart) Next appt:  09/03/2019 at 10:30 AM in Internal Medicine ([place] PROVIDER)  Component 08/17/19 0829   Stone Source Left Ureter    Kidney Stone Analysis Interp 100% Uric acid          Imaging:     Reading Physician Reading Date Result Priority   Heloise Purpura, MD  [phone] 09/17/2019      Narrative & Impression  Exam: Ultrasound of the Kidneys and Urinary Bladder    History:  Ureteral stone with hydronephrosis status post ureteroscopic management    Technique: Real-time ultrasound of the kidneys and urinary bladder     Comparison:  Images from placement of a left ureteral stent    Findings:     KIDNEYS: The kidneys are of normal size and echogenicity. No renal mass, hydronephrosis,  nephrolithiasis or perinephric fluid. Previously noted left hydronephrosis has resolved.  Right kidney: 12.1 cm  Left kidney:  12.8 cm    URINARY BLADDER: This patient voided immediately prior to this examination and therefore the bladder was not distended and is impossible to evaluate.    ADDITIONAL FINDINGS:  None.    IMPRESSION:  Negative ultrasound of the kidneys. The urinary bladder are almost completely empty.    Reading Physician Reading Date Result Priority   Jacelyn Pi, MD  [phone] 07/26/2019      Narrative & Impression     Exam:  CT Urogram (3 Phase Protocol)    History:  Microscopic hematuria, left flank pain, nausea    Technique:  Pre-contrast CT was performed through the abdomen and pelvis to include the entire urinary tract (kidneys through bladder).  CT was then performed through the abdomen during nephrographic phase of enhancement of the kidneys with IV contrast. Delayed, post-contrast CT was then performed through the abdomen and pelvis (to include the entire urinary tract) during the excretory phase of enhancement. This examination was tailored to optimize evaluation of  the urinary tract. No oral contrast was administered.  IV contrast:  150 mL Omnipaque 300.  DLP:  [id number] mGy-cm.  AEC (automated exposure control) and/or manual techniques such as size-specific kV and mAs are employed where appropriate to reduce radiation exposure for all CT exams. I-STAT creatinine level --  2.5 mg/dl.      Comparison:  Pelvis MRI from January 29, 2019    Abdomen and Pelvis Findings:    KIDNEYS/URETERS:  The left kidney demonstrates pelvocaliectasis and proximal ureterectasis extending to the mid ureter where there is an obstructing stone measuring 4.9 x 4.1 x 5.7 mm (image 74, series 2). Perinephric stranding is noted on the left.    Several additional nonobstructive intrarenal calculi are noted including a 2 mm calculus in the lower left kidney, and 2 punctate 1 to 2 mm calculi in the mid right kidney. No  right ureteral calculi or hydronephrosis.    The left kidney demonstrates diminished, delayed enhancement after contrast administration. No solid masses are noted. There is a benign simple cyst in the posterior left mid kidney measuring 8 mm. Mild urothelial thickening and hyperenhancement are noted on the left in the renal pelvis and proximal ureter.    Delayed images demonstrate no additional filling defects in the renal collecting systems. There is no excretion on the left kidney at the time of delayed imaging.    BLADDER:  The urinary bladder is decompressed and demonstrates a thickened wall, unchanged. No definite filling defects.    LOWER THORAX:  The lung bases demonstrate minimal dependent atelectasis. No effusions or consolidation. Heart size is normal.    HEPATOBILIARY:  Minimal steatosis. The hepatic capsule is smooth. No focal liver lesions. Several calcified gallstones are noted in the gallbladder neck region. No evidence of acute cholecystitis.    PANCREAS:  Normal.    SPLEEN:  Normal.    ADRENALS:  Normal.    VASCULAR:  Atherosclerotic plaquing is present in the abdominal aorta without aneurysm. The IVC is normal.    LYMPH NODES:  No adenopathy.    BOWEL/MESENTERY:  The stomach, duodenum, small bowel, and colon are normal in caliber without dilatation or thickening. The appendix is visualized and is normal. No mesenteric masses or fluid collections. Sigmoid diverticuli are noted with no acute diverticulitis.    PELVIC ORGANS:  The prostate gland is enlarged. No pelvic free fluid.    BONES/SOFT TISSUES:  There are postoperative changes from right hip arthroplasty. Moderate degenerative changes in the lumbar spine are noted.    IMPRESSION:  1. Obstructing stone in the proximal to mid left ureter measuring 5 x 4 x 6 mm, with associated hypoperfusion of the left kidney, perinephric and periureteric stranding, and proximal urothelial thickening and hyperenhancement. Superimposed upper urinary tract  infection is not excluded. Correlate clinically.  2. Bilateral punctate nonobstructing intrarenal calculi as described above.  3. Benign simple cyst in the posterior left mid kidney. No solid masses are noted.  4. Prostatic enlargement.  5. Decompressed, thick-walled urinary bladder. Question chronic bladder outlet obstruction.  6. Sigmoid diverticulosis with no acute diverticulitis. Normal appendix. Hepatic steatosis. Cholelithiasis with no acute cholecystitis.      Reading Physician Reading Date Result Priority   Kingsley Plan, MD  [phone] 01/29/2019      Narrative & Impression     EXAMINATION: MAGNETIC RESONANCE IMAGING OF THE PELVIS WITHOUT AND WITH CONTRAST        HISTORY: Prostate cancer. Pathology results from a prostate biopsy performed 09/19/2015  demonstrated Gleason 3+3 = 6 disease within the left apex. Please note Gleason 6 disease can be occult on imaging.    TECHNIQUE: MR imaging of the prostate gland was performed prior to and following administration of intravenous gadolinium.   Protocol: Prostate  Contrast: 10 ml Gadavist      FINDINGS:  The prostate transition zone is enlarged with benign prostatic hyperplasia.    Susceptibility artifact from the right hip arthroplasty renders diffusion-weighted imaging nondiagnostic. T2-weighted imaging was degraded but still diagnostic.    Per the PI-RADS 2 scoring system ([email or web address]), there are no suspicious lesions in the prostate.    Staging Information: The seminal vesicles appear symmetric. No tumor involvement identified. The membranous urethra length is approximately 1.2 cm on series 2 image 11.    Prostate volume: 79.29 mL      Other Findings: Susceptibility artifact is present from the right hip arthroplasty. This limits assessment of the right hemipelvis. The left hemipelvic dominant arteries and veins are patent. No definitive pelvic lymphadenopathy. No suspicious marrow replacing lesion. Colonic  diverticulosis. Urinary bladder wall thickening, likely secondary to a chronic outlet obstruction. Fat-containing right inguinal hernia. Synovitis of the L5-S1 facet joints.      IMPRESSION:    1. Enlarged prostate with findings consistent with benign prostate hyperplasia.  2. No focal lesion identified meeting criteria for suspicion per PI-RADS. Of note Gleason 6 disease can be occult on imaging.     Bladder scan      Impression  Performed by: OTHER  PVR OF 0 ML.         Orders:     Orders Placed This Encounter   Procedures   ? PSA (Prostate Specific Antigen)     No-this is not a screening test     Standing Status:   Future     Number of Occurrences:   1     Standing Expiration Date:   10/31/2020     Order Specific Question:   Is this PSA a screening Test?     Answer:   No-this is not a screening test   ? PSA (Prostate Specific Antigen)     No-this is not a screening test     Standing Status:   Future     Standing Expiration Date:   10/31/2020     Order Specific Question:   Is this PSA a screening Test?     Answer:   No-this is not a screening test   ? Bladder scan   ? POCT urinalysis dipstick     Order Specific Question:   Release to patient     Answer:   Immediate     Shary Key, MD  Electronically signed by Shary Key, MD at 11/01/2019  1:25 PM EDT

## 2019-11-30 ENCOUNTER — Encounter: Payer: Self-pay | Admitting: Gastroenterology

## 2020-05-04 NOTE — Progress Notes (Signed)
Formatting of this note is different from the original.  Name:  Elijah Perry  DOB: 1941-01-04  Today's Date: 05/04/2020  Age:  79 y.o.    Assessment:     Problem List Items Addressed This Visit       Genitourinary    Prostate cancer (CMS-HCC) - Primary    Relevant Medications    tamsulosin (FLOMAX) 0.4 mg capsule    Other Relevant Orders    POCT urinalysis dipstick (Completed)    Bladder scan (Completed)    PSA (Prostate Specific Antigen)    BPH with obstruction/lower urinary tract symptoms    Relevant Orders    POCT urinalysis dipstick (Completed)    Bladder scan (Completed)    Overactive bladder    Uric acid nephrolithiasis    Relevant Medications    potassium citrate 15 mEq TbER    Recurrent UTI    Relevant Orders    POCT urinalysis dipstick (Completed)    Bladder scan (Completed)      Other    Elevated PSA    Relevant Orders    POCT urinalysis dipstick (Completed)    Bladder scan (Completed)    Urge incontinence       Plan:     Elijah Perry follows up today with a history of prostate cancer.  He reports that a prostate biopsy was performed around 2015 for an elevated PSA and that the biopsy was positive in 1 out of 12 cores for prostate cancer.  His prostate cancer was managed with active surveillance, he reports a repeat prostate biopsy a year later again revealed 1 out of 12 cores positive for prostate cancer.  His most recent PSA was 2.18 on 02/25/2019. To rule out any aggressive appearing lesions, I ordered an MRI of the prostate, no lesions consistent with clinically significant prostate cancer were identified.    He has a history of bothersome mixed lower urinary tract symptoms, likely related to BPH.  I started him on tamsulosin, with which he experienced improvement in his lower urinary tract symptoms.  He continued to experience nocturia x2-3.  I prescribed oxybutynin 5 mg nightly, however, he reports persistent mixed lower urinary tract symptoms.  His urinalysis today is generally unremarkable, and his  postvoid residual bladder scan indicates that he is emptying his bladder well.  I will increase his tamsulosin dosing to twice daily.    He followed up with recent onset of left flank pain and nausea, he reported a history nephrolithiasis.  A CT urogram revealed an obstructing 6 mm left proximal ureteral stone.  He is now status post ureteroscopic management of his left-sided stone burden on 08/17/2019.  His stent was removed postoperatively in the office without difficulty.  A follow-up renal ultrasound revealed resolution of his hydronephrosis.    His stone analysis revealed 100% uric acid.  Given his recurrent nephrolithiasis we decided to initiate Urocit-K, we reviewed the potential side effects.  We have reviewed dietary recommendations for stone prevention, and I have provided him with literature regarding the same.    After his recent stone procedure he has experienced recurrent urinary tract infections which have been treated, I prescribed a 20-month course of nitrofurantoin prophylaxis which he has completed.  He denies any further urinary tract infection symptoms, his urinalysis today is generally unremarkable.    RTC 12 months with PSA prior.    Providers:     Referring Physician:   Meredith Leeds, MD    PCP:   Tommy Rainwater  [place], MD    Subjective:     Chief Complaint:  Chief Complaint   Patient presents with   ? Follow-up     decrease strean   ? Nocturia     HPI:   Elijah Perry is a 79 y.o. male who presents today with a h/o prostate cancer. He had a prostate biopsy around 2015 for an elevated PSA. He reports 1/12 cores was + for prostate cancer. His prostate cancer was managed with active surveillance. He reports having a repeat biopsy a year later which again he reports revealed 1/12 cores positive for prostate cancer.     He reports urinary frequency and nocturia x 3. He also reports urgency and urge incontinence. No gross hematuria here of late. No dysuria. He reports a diminished force  of stream.      Past Medical History :     Past Medical History:   Diagnosis Date   ? Anxiety    ? At risk for falls    ? Depression    ? Disease of thyroid gland    ? Hyperlipidemia    ? Kidney stones    ? Prostate CA (CMS-HCC)    ? Sleep apnea     Uses CPAP   ? SOB (shortness of breath)    ? Tremor of left hand    ? Tremors of nervous system      Past Surgical History:     Past Surgical History:   Procedure Laterality Date   ? CHG X-RAY RETROGRADE PYELOGRAM Left 08/03/2019    Procedure: CHG X-RAY RETROGRADE PYELOGRAM;  Surgeon: Shary Key, MD;  Location: [place];  Service: Urology   ? CHG X-RAY RETROGRADE PYELOGRAM Left 08/17/2019    Procedure: CHG X-RAY RETROGRADE PYELOGRAM;  Surgeon: Shary Key, MD;  Location: [place];  Service: Urology   ? COLONOSCOPY     ? EYE SURGERY      Retinal surgery   ? FRACTURE SURGERY     ? JOINT REPLACEMENT Right     hip, knee   ? PR CATH PLACE/CORON ANGIO, IMG SUPER/INTERP,W LEFT HEART VENTRICULOGRAPHY N/A 03/03/2019    Procedure: Left Heart Catheterization W Intervention;  Surgeon: Nash Shearer, MD;  Location: [facility] CATH;  Service: Cardiology   ? PR CYSTO/URETERO/PYELOSCOPY, CALCULUS TX Left 08/17/2019    Procedure: CYSTOURETHROSCOPY, W/URETEROSCOPY &/OR PYELOSCOPY; W/REMOVAL/MANIPULATION CALCULUS(URETERAL CATH INCLUDED);  Surgeon: Shary Key, MD;  Location: [place];  Service: Urology   ? PR CYSTO/URETERO/PYELOSCOPY, DX Left 08/03/2019    Procedure: CYSTOURETHOSCOPY, WITH URETEROSCOPY AND/OR PYELOSCOPY; DIAGNOSTIC;  Surgeon: Shary Key, MD;  Location: [place];  Service: Urology   ? PR CYSTOSCOPY,INSERT URETERAL STENT Left 08/03/2019    Procedure: CYSTOURETHROSCOPY,  WITH INSERTION OF INDWELLING URETERAL STENT (EG, GIBBONS OR DOUBLE-J TYPE);  Surgeon: Shary Key, MD;  Location: [place];  Service: Urology   ? PR CYSTOSCOPY,INSERT URETERAL STENT Left 08/17/2019    Procedure: CYSTOURETHROSCOPY,  WITH INSERTION OF  INDWELLING URETERAL STENT (EG, GIBBONS OR DOUBLE-J TYPE);  Surgeon: Shary Key, MD;  Location: [place];  Service: Urology   ? PR CYSTOSCOPY,REMV CALCULUS,COMPLIC Left 01/14/2201    Procedure: CYSTOURETHROSCOPY, W/REMOVAL FOREIGN BODY, CALCULUS, OR URETERAL STENT FROM URETHRA OR BLADDER; COMPLICATED;  Surgeon: Shary Key, MD;  Location: [place];  Service: Urology   ? ROTATOR CUFF REPAIR Right    ? SKIN BIOPSY     ? TONSILLECTOMY  Medications:     Current Outpatient Medications:   ?  BREO ELLIPTA 200-25 mcg/dose inhaler, USE 1 INHALATION BY MOUTH  ONCE DAILY AT THE SAME TIME EACH DAY, Disp: 3 each, Rfl: 0  ?  diclofenac (VOLTAREN) 75 MG EC tablet, TAKE 1 TABLET BY MOUTH  TWICE DAILY, Disp: 180 tablet, Rfl: 3  ?  escitalopram oxalate (LEXAPRO) 20 MG tablet, Take 0.5 tablets (10 mg total) by mouth daily., Disp: 45 tablet, Rfl: 3  ?  gabapentin (NEURONTIN) 300 MG capsule, Take 1 capsule (300 mg total) by mouth Three (3) times a day., Disp: 270 capsule, Rfl: 0  ?  levothyroxine (SYNTHROID) 50 MCG tablet, TAKE 1 TABLET BY MOUTH  DAILY, Disp: 90 tablet, Rfl: 3  ?  oxybutynin (DITROPAN) 5 MG tablet, Take 1 tablet (5 mg total) by mouth nightly., Disp: 90 tablet, Rfl: 3  ?  potassium citrate 15 mEq TbER, Take 1 tablet by mouth 2 (two) times a day with meals., Disp: 180 tablet, Rfl: 3  ?  rosuvastatin (CRESTOR) 40 MG tablet, TAKE 1 TABLET BY MOUTH  DAILY, Disp: 90 tablet, Rfl: 3  ?  tamsulosin (FLOMAX) 0.4 mg capsule, Take 1 capsule (0.4 mg total) by mouth two (2) times a day., Disp: 180 capsule, Rfl: 3    Current Facility-Administered Medications:   ?  cyanocobalamin (vitamin B-12) injection 1,000 mcg, 1,000 mcg, Intramuscular, Q30 Days, Janann August, MD, 1,000 mcg at 03/31/20 1036    Allergies:     Allergies   Allergen Reactions   ? Adhesive Tape-Silicones Hives, Itching and Rash     Social History:     Social History     Socioeconomic History   ? Marital status: Married      Spouse name: None   ? Number of children: None   ? Years of education: None   ? Highest education level: None   Occupational History   ? None   Tobacco Use   ? Smoking status: Former Smoker     Packs/day: 0.25     Years: 8.00     Pack years: 2.00     Types: Cigarettes     Start date: 07/15/1957     Quit date: 07/15/1965     Years since quitting: 54.8   ? Smokeless tobacco: Never Used   Vaping Use   ? Vaping Use: Never used   Substance and Sexual Activity   ? Alcohol use: No     Comment: occasionally   ? Drug use: No   ? Sexual activity: None   Other Topics Concern   ? None   Social History Narrative   ? None     Social Determinants of Health     Financial Resource Strain:    ? Difficulty of Paying Living Expenses:    Food Insecurity: No Food Insecurity   ? Worried About Charity fundraiser in the Last Year: Never true   ? Ran Out of Food in the Last Year: Never true   Transportation Needs: No Transportation Needs   ? Lack of Transportation (Medical): No   ? Lack of Transportation (Non-Medical): No   Physical Activity:    ? Days of Exercise per Week:    ? Minutes of Exercise per Session:    Stress:    ? Feeling of Stress :    Social Connections:    ? Frequency of Communication with Friends and Family:    ? Frequency of Social Gatherings with Friends  and Family:    ? Attends Religious Services:    ? Active Member of Clubs or Organizations:    ? Attends Archivist Meetings:    ? Marital Status:      Family History:     Family History   Problem Relation Age of Onset   ? Heart disease Father    ? Heart attack Father    ? No Known Problems Mother      ROS:     Review of Systems   Constitutional: Negative for chills and fever.   HENT: Negative for congestion, facial swelling and sore throat.    Eyes: Negative for pain and visual disturbance.   Respiratory: Negative for cough and shortness of breath.    Cardiovascular: Negative for chest pain.   Gastrointestinal: Negative for abdominal pain, nausea and vomiting.    Endocrine: Negative for cold intolerance, polydipsia and polyphagia.   Genitourinary:        See HPI   Musculoskeletal: Negative for arthralgias and joint swelling.   Skin: Negative for rash.   Allergic/Immunologic: Negative for immunocompromised state.   Neurological: Negative for seizures, weakness and numbness.   Hematological: Negative for adenopathy. Does not bruise/bleed easily.   Psychiatric/Behavioral: Negative for hallucinations and self-injury.     Vitals:     BP 129/78 (BP Site: R Arm, BP Position: Sitting, BP Cuff Size: Large)   Pulse 81   Temp 35.8 C (96.5 F) (Temporal)   Ht 185.4 cm (6\' 1" )   Wt (!) 128.6 kg (283 lb 9.6 oz)   SpO2 93%   BMI 37.42 kg/m     Objective:     Physical Exam  Constitutional:       General: He is not in acute distress.     Appearance: He is well-developed. He is not toxic-appearing.   HENT:      Head: Normocephalic and atraumatic.      Right Ear: External ear normal.      Left Ear: External ear normal.      Nose: Nose normal.   Eyes:      General: No scleral icterus.     Conjunctiva/sclera: Conjunctivae normal.   Neck:      Trachea: Phonation normal. No tracheal deviation.   Cardiovascular:      Rate and Rhythm: Normal rate and regular rhythm.      Comments: No JVD noted, good capillary refill  Pulmonary:      Effort: Pulmonary effort is normal. No accessory muscle usage or respiratory distress.   Abdominal:      General: There is no distension.      Palpations: Abdomen is soft. There is no mass.      Tenderness: There is no abdominal tenderness.      Hernia: No hernia is present.   Musculoskeletal:         General: No tenderness or deformity. Normal range of motion.      Cervical back: Normal range of motion and neck supple.      Comments: No CVAT, No midline vertebral tenderness   Skin:     General: Skin is warm and dry.      Findings: No rash.   Neurological:      Mental Status: He is alert and oriented to person, place, and time.      Cranial Nerves: No cranial  nerve deficit.   Psychiatric:         Mood and Affect: Mood is not anxious.  Speech: Speech normal.         Behavior: Behavior normal. Behavior is cooperative.         Judgment: Judgment normal.     Labs:     Results for orders placed or performed in visit on 05/04/20   POCT urinalysis dipstick   Result Value Ref Range    Color, UA Yellow     Clarity, UA Clear     Glucose, UA Negative Negative    Bilirubin, UA Negative Negative    Ketones, POC Negative Negative    Spec Grav, UA 1.020 1.005 - 1.030    Blood, UA Negative Negative    pH, UA 5.0 5.0 - 9.0    Protein, UA Negative Negative    Urobilinogen, UA Normal Negative (0.2 mg/dL)    Leukocytes, UA Negative Negative    Nitrite, UA Negative Negative    STRIP LOT NUMBER [id number]     STRIP LOT EXPIRATION 11-11-20      PSA (ng/mL)   Date Value   05/02/2020 2.84     Component      Latest Ref Rng & Units 05/08/2017 11/26/2017 07/13/2018 01/18/2019   Prostate Specific Antigen      0.00 - 4.00 ng/mL 3.70 3.05 2.58 1.86     Component      Latest Ref Rng & Units 02/25/2019 11/01/2019   Prostate Specific Antigen      0.00 - 4.00 ng/mL 2.18 2.66     Stone Analysis  Order: [id number]  Status:  Final result Visible to patient:  Yes (MyChart) Next appt:  09/03/2019 at 10:30 AM in Internal Medicine ([place] PROVIDER)  Component 08/17/19 0829   Stone Source Left Ureter    Kidney Stone Analysis Interp 100% Uric acid          Imaging:     Reading Physician Reading Date Result Priority   Heloise Purpura, MD  [phone] 09/17/2019      Narrative & Impression  Exam: Ultrasound of the Kidneys and Urinary Bladder    History:  Ureteral stone with hydronephrosis status post ureteroscopic management    Technique: Real-time ultrasound of the kidneys and urinary bladder     Comparison:  Images from placement of a left ureteral stent    Findings:     KIDNEYS: The kidneys are of normal size and echogenicity. No renal mass, hydronephrosis, nephrolithiasis or  perinephric fluid. Previously noted left hydronephrosis has resolved.  Right kidney: 12.1 cm  Left kidney:  12.8 cm    URINARY BLADDER: This patient voided immediately prior to this examination and therefore the bladder was not distended and is impossible to evaluate.    ADDITIONAL FINDINGS:  None.    IMPRESSION:  Negative ultrasound of the kidneys. The urinary bladder are almost completely empty.    Reading Physician Reading Date Result Priority   Jacelyn Pi, MD  [phone] 07/26/2019      Narrative & Impression     Exam:  CT Urogram (3 Phase Protocol)    History:  Microscopic hematuria, left flank pain, nausea    Technique:  Pre-contrast CT was performed through the abdomen and pelvis to include the entire urinary tract (kidneys through bladder).  CT was then performed through the abdomen during nephrographic phase of enhancement of the kidneys with IV contrast. Delayed, post-contrast CT was then performed through the abdomen and pelvis (to include the entire urinary tract) during the excretory phase of enhancement. This examination was tailored to  optimize evaluation of the urinary tract. No oral contrast was administered.  IV contrast:  150 mL Omnipaque 300.  DLP:  4098 mGy-cm.  AEC (automated exposure control) and/or manual techniques such as size-specific kV and mAs are employed where appropriate to reduce radiation exposure for all CT exams. I-STAT creatinine level --  2.5 mg/dl.      Comparison:  Pelvis MRI from January 29, 2019    Abdomen and Pelvis Findings:    KIDNEYS/URETERS:  The left kidney demonstrates pelvocaliectasis and proximal ureterectasis extending to the mid ureter where there is an obstructing stone measuring 4.9 x 4.1 x 5.7 mm (image 74, series 2). Perinephric stranding is noted on the left.    Several additional nonobstructive intrarenal calculi are noted including a 2 mm calculus in the lower left kidney, and 2 punctate 1 to 2 mm calculi in the mid right kidney. No right ureteral  calculi or hydronephrosis.    The left kidney demonstrates diminished, delayed enhancement after contrast administration. No solid masses are noted. There is a benign simple cyst in the posterior left mid kidney measuring 8 mm. Mild urothelial thickening and hyperenhancement are noted on the left in the renal pelvis and proximal ureter.    Delayed images demonstrate no additional filling defects in the renal collecting systems. There is no excretion on the left kidney at the time of delayed imaging.    BLADDER:  The urinary bladder is decompressed and demonstrates a thickened wall, unchanged. No definite filling defects.    LOWER THORAX:  The lung bases demonstrate minimal dependent atelectasis. No effusions or consolidation. Heart size is normal.    HEPATOBILIARY:  Minimal steatosis. The hepatic capsule is smooth. No focal liver lesions. Several calcified gallstones are noted in the gallbladder neck region. No evidence of acute cholecystitis.    PANCREAS:  Normal.    SPLEEN:  Normal.    ADRENALS:  Normal.    VASCULAR:  Atherosclerotic plaquing is present in the abdominal aorta without aneurysm. The IVC is normal.    LYMPH NODES:  No adenopathy.    BOWEL/MESENTERY:  The stomach, duodenum, small bowel, and colon are normal in caliber without dilatation or thickening. The appendix is visualized and is normal. No mesenteric masses or fluid collections. Sigmoid diverticuli are noted with no acute diverticulitis.    PELVIC ORGANS:  The prostate gland is enlarged. No pelvic free fluid.    BONES/SOFT TISSUES:  There are postoperative changes from right hip arthroplasty. Moderate degenerative changes in the lumbar spine are noted.    IMPRESSION:  1. Obstructing stone in the proximal to mid left ureter measuring 5 x 4 x 6 mm, with associated hypoperfusion of the left kidney, perinephric and periureteric stranding, and proximal urothelial thickening and hyperenhancement. Superimposed upper urinary tract infection is not  excluded. Correlate clinically.  2. Bilateral punctate nonobstructing intrarenal calculi as described above.  3. Benign simple cyst in the posterior left mid kidney. No solid masses are noted.  4. Prostatic enlargement.  5. Decompressed, thick-walled urinary bladder. Question chronic bladder outlet obstruction.  6. Sigmoid diverticulosis with no acute diverticulitis. Normal appendix. Hepatic steatosis. Cholelithiasis with no acute cholecystitis.      Reading Physician Reading Date Result Priority   Kingsley Plan, MD  [phone] 01/29/2019      Narrative & Impression     EXAMINATION: MAGNETIC RESONANCE IMAGING OF THE PELVIS WITHOUT AND WITH CONTRAST        HISTORY: Prostate cancer. Pathology results from a prostate  biopsy performed 09/19/2015 demonstrated Gleason 3+3 = 6 disease within the left apex. Please note Gleason 6 disease can be occult on imaging.    TECHNIQUE: MR imaging of the prostate gland was performed prior to and following administration of intravenous gadolinium.   Protocol: Prostate  Contrast: 10 ml Gadavist      FINDINGS:  The prostate transition zone is enlarged with benign prostatic hyperplasia.    Susceptibility artifact from the right hip arthroplasty renders diffusion-weighted imaging nondiagnostic. T2-weighted imaging was degraded but still diagnostic.    Per the PI-RADS 2 scoring system ([email or web address]), there are no suspicious lesions in the prostate.    Staging Information: The seminal vesicles appear symmetric. No tumor involvement identified. The membranous urethra length is approximately 1.2 cm on series 2 image 11.    Prostate volume: 79.29 mL      Other Findings: Susceptibility artifact is present from the right hip arthroplasty. This limits assessment of the right hemipelvis. The left hemipelvic dominant arteries and veins are patent. No definitive pelvic lymphadenopathy. No suspicious marrow replacing lesion. Colonic diverticulosis. Urinary  bladder wall thickening, likely secondary to a chronic outlet obstruction. Fat-containing right inguinal hernia. Synovitis of the L5-S1 facet joints.      IMPRESSION:    1. Enlarged prostate with findings consistent with benign prostate hyperplasia.  2. No focal lesion identified meeting criteria for suspicion per PI-RADS. Of note Gleason 6 disease can be occult on imaging.     Bladder scan      Impression    PVR OF 0 ML         Orders:     Orders Placed This Encounter   Procedures   ? PSA (Prostate Specific Antigen)     No-this is not a screening test     Standing Status:   Future     Standing Expiration Date:   05/04/2021     Order Specific Question:   Is this PSA a screening Test?     Answer:   No-this is not a screening test   ? Bladder scan   ? POCT urinalysis dipstick     Order Specific Question:   Release to patient     Answer:   Immediate     Shary Key, MD  Electronically signed by Shary Key, MD at 05/04/2020  1:21 PM EDT

## 2020-08-08 NOTE — Progress Notes (Signed)
Formatting of this note might be different from the original.    Self Swab Type: Anterior Nasal  Electronically signed by Mike Gip at 08/08/2020  5:34 PM EST

## 2020-11-02 NOTE — Telephone Encounter (Signed)
Formatting of this note might be different from the original.  Pt wife calling to speak to nurse about his gallbladder appt.Please call her back at [phone]  Electronically signed by Cheri Kearns at 11/02/2020  4:42 PM EDT

## 2020-11-02 NOTE — Telephone Encounter (Signed)
Formatting of this note might be different from the original.  Returning call from call center message.  Pt's wife, Mechele Claude (authorized) requests an earlier appt for pt.  I told wife, there are currently no earlier appts.  Pt is on wait list for cancellations.  Pt is scheduled for an ov with K. Gambino PA 12/07/2020.  No further concerns voiced at this time.   Electronically signed by Buzzy Han, RN at 11/03/2020 11:33 AM EDT

## 2020-11-02 NOTE — Telephone Encounter (Signed)
Formatting of this note might be different from the original.  Pts wife Mechele Claude called to see, if the pt can be see before his may appointment. Pt is is need ot be seen asap due ot symptoms pt has lost a lot of weight as well. Mechele Claude can be reached at [phone].  Electronically signed by Armandina Stammer at 11/02/2020  2:47 PM EDT

## 2020-11-06 NOTE — Progress Notes (Signed)
Formatting of this note might be different from the original.  Abstraction Result Flowsheet Data    This patient's last AWV date: [facility] Last Medicare Wellness Visit Date: 07/17/2017  This patients last WCC/CPE date: : Not Found    Reason for Encounter  Reason for Encounter: Outreach  Primary Reason for Call: AWV  Outreach Call Outcome: Patient would like call back later  Text Message: No                              Electronically signed by Garrel Ridgel, CMA at 11/06/2020  4:15 PM EDT

## 2020-11-14 NOTE — Telephone Encounter (Signed)
Formatting of this note might be different from the original.  Pt's wife is wanting to know where exactly he is on the wait list for his May 25th appt  Electronically signed by Cottie Banda McDougald at 11/14/2020  4:10 PM EDT

## 2020-11-14 NOTE — Telephone Encounter (Signed)
Formatting of this note might be different from the original.  Sent patient a Passenger transport manager signed by Willette Cluster at 11/21/2020 10:33 AM EDT

## 2020-11-21 NOTE — Progress Notes (Signed)
Formatting of this note is different from the original.  Subjective:     Patient ID: Elijah Perry is a 80 y.o. male.        Chief Complaint:  Diabetes, Depression, and Eye Problem (Double vision)    #1. DM.  Currently on metformin 500 mg once daily.  He reports he is tolerating the medication well    #2.  Double vision.  Patient notes over the last 3 days he has had double vision on and off.  He denies any headaches with this or other neurological symptoms.  Denies any slurred speech but does have problems with balance is Being followed neurology    #3.  Continued abdominal pain with nausea.  Patient is still waiting for GI evaluation, not scheduled till 12/07/2020.  Patient has had abdominal ultrasound which did confirm presence of gallstones but not obstructive and no signs of inflammation on ultrasound on 10/23/2020.  Overall he admits he is feeling a little better, continues to have nausea but has not had any retching in the last week and a half.  Patient notes symptoms tend to be worse after fatty meal.    #4.  Depression.  Currently is on Lexapro 20 mg daily.  He reports he is tolerant medication well but does not think it is doing very much at this time.    #5.  Request COVID, second booster.  Patient notes his first booster was more than 4 months ago but could not give me the exact date    #6.  Odor in urine.  Patient notes at night he oftentimes has to get up to go to the bathroom and when he finishes he will wait a minute and have to go little bit more.  He notes with this portion he has noticed occasional order.  He denies any burning with urination however    #7.  Vitamin D deficiency.  Currently he is on vitamin D 50,000 IUs daily he has been on this since February.    #8.  Vitamin B12 deficiency.  Patient states he currently takes vitamin B12 shots once monthly however has not had one in the last couple of months.    History of Present Illness:  HPI    The following portions of the patient's history  were reviewed and updated as appropriate: medications, allergies, past medical history, family history, social history, surgical history, current problems. All of the patient's ROS was normal except as documented in the HPI.    Outpatient Medications Prior to Visit   Medication Sig Dispense Refill    diclofenac DR (VOLTAREN) 75 MG EC tablet Take 75 mg by mouth 2 (two) times a day.      ergocalciferol,vitamin D2, (DRISDOL) 1,250 mcg (50,000 unit) capsule TAKE 1 CAPSULE (1,250 MCG TOTAL) BY MOUTH ONCE A WEEK.      escitalopram oxalate (LEXAPRO) 20 MG tablet Take 10 mg by mouth daily as needed.      fluticasone furoate-vilanteroL (BREO ELLIPTA) 200-25 mcg/dose Inhale 1 puff daily as needed.      levothyroxine (SYNTHROID) 50 MCG tablet Take 1 tablet by mouth daily.      metformin (GLUCOPHAGE) 500 MG tablet Take 500 mg by mouth 2 (two) times a day with meals.      potassium citrate 15 mEq TbER Take 1 tablet by mouth 2 (two) times a day with meals.      rosuvastatin (CRESTOR) 40 MG tablet Take 1 tablet by mouth daily.  tamsulosin (FLOMAX) 0.4 mg cap Take 0.4 mg by mouth.      gabapentin (NEURONTIN) 300 MG capsule Take 300 mg by mouth daily.      omeprazole (PRILOSEC) 40 MG capsule Take 1 capsule (40 mg total) by mouth daily. 30 capsule 1    oxybutynin (DITROPAN) 5 MG tablet oxybutynin chloride 5 mg tablet   TAKE 1 TABLET BY MOUTH EVERY DAY AT NIGHT       No facility-administered medications prior to visit.     Review of Systems   Constitutional:  Positive for unexpected weight change. Negative for chills, fatigue and fever.   HENT:  Negative for congestion, trouble swallowing and voice change.    Eyes:  Positive for visual disturbance (Double vision that comes and goes).   Respiratory:  Negative for shortness of breath and wheezing.    Cardiovascular:  Negative for chest pain.   Gastrointestinal:  Positive for abdominal pain and nausea. Negative for diarrhea and vomiting.   Neurological:  Negative for dizziness,  seizures, syncope, speech difficulty, weakness and headaches.     PHQ-9 Score: 10; Risk Category: Moderate (10-14)  Suicidal ideation: Not at all  Reported impairment: Somewhat difficult     GAD-7 Total: 12 Risk Category: Moderate (10-14)  Reported impairment: Somewhat difficult    Objective:     BP 131/77 (BP Location: Right upper arm, BP Position: Sitting, Cuff Size: Large Adult [place]))   Pulse 86   Temp 98.5 F (36.9 C) (Tympanic)   Ht 1.854 m (6' 0.99")   Wt (!) 111.8 kg (246 lb 9.4 oz)   SpO2 96%   BMI 32.54 kg/m       Physical Exam  Vitals and nursing note reviewed.   Constitutional:       General: He is not in acute distress.     Appearance: Normal appearance. He is well-developed. He is obese. He is not ill-appearing, toxic-appearing or diaphoretic.   HENT:      Head: Normocephalic and atraumatic.      Right Ear: Tympanic membrane, ear canal and external ear normal.      Left Ear: Tympanic membrane, ear canal and external ear normal.      Nose: Nose normal.      Mouth/Throat:      Mouth: Mucous membranes are moist.      Pharynx: Oropharynx is clear.   Eyes:      General: No scleral icterus.        Right eye: No discharge.         Left eye: No discharge.      Conjunctiva/sclera: Conjunctivae normal.      Pupils: Pupils are equal, round, and reactive to light.   Neck:      Thyroid: No thyromegaly.   Cardiovascular:      Rate and Rhythm: Normal rate and regular rhythm.      Pulses: Normal pulses.      Heart sounds: Normal heart sounds. No murmur heard.    No friction rub. No gallop.   Pulmonary:      Effort: Pulmonary effort is normal.      Breath sounds: Normal breath sounds. No wheezing or rales.   Abdominal:      General: Bowel sounds are normal. There is no distension.      Palpations: Abdomen is soft. There is no mass.      Tenderness: There is abdominal tenderness (Complains of right upper quadrant discomfort but no guarding noted). There is no  guarding or rebound.   Musculoskeletal:          General: No tenderness or deformity. Normal range of motion.      Cervical back: Normal range of motion and neck supple.   Lymphadenopathy:      Cervical: No cervical adenopathy.   Skin:     General: Skin is warm and dry.      Findings: No erythema.   Neurological:      Mental Status: He is alert and oriented to person, place, and time.      Cranial Nerves: No cranial nerve deficit.      Motor: No abnormal muscle tone.      Deep Tendon Reflexes: Reflexes are normal and symmetric. Reflexes normal.   Psychiatric:         Mood and Affect: Mood normal.         Behavior: Behavior normal.         Thought Content: Thought content normal.         Judgment: Judgment normal.     Lab on 11/21/2020   Component Date Value Ref Range Status    Sodium 11/21/2020 145  136 - 145 mmol/L Final    Potassium 11/21/2020 4.4  3.5 - 5.1 mmol/L Final    Chloride 11/21/2020 103  99 - 108 mmol/L Final    CO2 11/21/2020 31  21 - 31 mmol/L Final    BUN 11/21/2020 14  7 - 25 mg/dL Final    Creatinine 11/21/2020 1.35 (A) 0.67 - 1.20 mg/dL Final    Glucose, Random 11/21/2020 121  70 - 199 mg/dL Final    Calcium, Total 11/21/2020 10.3  8.8 - 10.6 mg/dL Final    Osmolality (calculated) 11/21/2020 290  270 - 295 mOsm/kg Final    Anion Gap 11/21/2020 11  3 - 11 Final    Albumin 11/21/2020 4.4  3.5 - 5.7 g/dL Final    Bilirubin, Total 11/21/2020 0.6  0.3 - 1.0 mg/dL Final    Alkaline Phosphatase 11/21/2020 46  34 - 104 IU/L Final    ALT 11/21/2020 13  7 - 52 IU/L Final    AST 11/21/2020 16  13 - 39 IU/L Final    Protein, Total 11/21/2020 7.2  6.4 - 8.9 g/dL Final    Albumin/Globulin Ratio 11/21/2020 1.6  1.2 - 2.3 Final    Vitamin D 25-Hydroxy 11/21/2020 15 (A) 30 - 100 ng/mL Final    Comment: In 2011, the Clinical Guidelines Subcommittee of the Endocrine Society Task  Force established the recommended serum 25(OH)vitamin D levels of <20ng/mL  as deficient and 20 to  <30ng/mL as insufficient. Other clinical reference  citations may show different  values.     Vitamin B12 11/21/2020 203  180 - 914 pg/ml Final    EGFR 11/21/2020 51 (A) mL/min/1.37m Final    Comment: Interpretive Ranges for Patients with Chronic Kidney Disease:  eGFR: >60 mL/min    Normal  eGFR: 30-59 mL/min  Moderately Decreased  eGFR: 15-29 mL/min  Severely Decreased  eGFR: <15 mL/min    Kidney Failure    Note: These GFR calculation do not apply in acute situations when GFR is  changing rapidly or in patients on dialysis. See additional information on  pathology website.    Office Visit on 11/21/2020   Component Date Value Ref Range Status    Glycosylated Hemoglobin (HGB A1C),* 11/21/2020 6.2 (A) 4 - 5.6 % Final-Edited    Urine Color, POCT 11/21/2020 Yellow  Colorless,  Light Yellow, Yellow, Other Final    Urine Clarity, POCT 11/21/2020 Clear  Clear Final    Urine Glucose, POCT 11/21/2020 Negative  Negative Final    Urine Bilirubin, POCT 11/21/2020 Negative  Negative Final    Urine Ketones, POCT 11/21/2020 Negative  Negative Final    Urine Specific Gravity, POCT 11/21/2020 1.025  1.003 - 1.035 Final    Urine Blood, POCT 11/21/2020 Trace (A) Negative Final    Urine pH, POCT 11/21/2020 5.5  5.0 - 8.0 Final    Urine Protein, POCT 11/21/2020 Negative  Negative Final    Urine Urobilinogen, POCT 11/21/2020 0.2  0.2, 1.0 Final    Urine Nitrite, POCT 11/21/2020 Negative  Negative Final    Urine Leukocyte Esterase, POCT 11/21/2020 Negative  Negative Final     Brain Hilts M.D.    This document has been electronically signed and was completed in part using [employer] voice recognition software.  Despite being reviewed by the physician this note may contain typographical, grammar and/or voice recognition errors.      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 Calculus of gallbladder without cholecystitis without obstruction    3. Nausea    4. Double vision    5. Lower urinary tract symptoms (LUTS)    6. Vitamin D deficiency    7. Vitamin B12 deficiency    8. Immunization due       Plan:     Diabetes 1.5, managed as type 2 (CMS/HCC)  Diabetes control:  Well-controlled    Medication Compliance:  Compliant  Medication Management:     Continue metformin 500 mg once daily    Continue monitor diet regularly and will encourage continue work on weight reduction General    Recheck again in 6 months  Home glucose monitoring recommended      The patient?s care plan was reviewed and updated. Instructions and counseling were provided regarding patient goals and barriers. He was counseled to adopt a healthy lifestyle. Educational resources and self-management tools have been provided.     Calculus of gallbladder without cholecystitis without obstruction  Did counsel patient that the gallbladder could be the source of his symptoms.  Since he is unable to get into GI until the end of the month we will go ahead and proceed with a HIDA scan.  If positive we will plan to refer to general surgery for evaluation possible cholecystectomy    Nausea  Possibly related to the gallbladder.  Will give a trial of Zofran 4 mg p.o. every 8 hours as needed    Double vision  Patient today was within normal limits, unable to reproduce symptoms  Recommend referral to ophthalmology for further evaluation recommendations    Lower urinary tract symptoms (LUTS)  Reassured patient UA today was within normal limits, no evidence of a UTI.    Vitamin D deficiency  Currently on vitamin D2 50,000 units weekly  Vitamin D 25 hydroxy pending.  Will make recommendations pending those results.    Vitamin B12 deficiency  History of vitamin B12 deficiency was receiving vitamin B12 IM monthly however has not had a shot in several months.  Vitamin B12 level pending.      Patient also requested COVID second booster, offered Moderna however patient states his previous 3 shots were with [employer].  Advised patient not to contact a pharmacy to receive this immunization.    Orders Placed This Encounter   Procedures  NM [facility] HIDA Scan    CMP     Vitamin D 25-Hydroxy - In House    Vitamin B12    Referral to Ophthalmology    POCT Glycosylated Hemoglobin (Hgb A1C)    POCT Urinalysis     Medications Ordered This Encounter   Medications    ondansetron (ZOFRAN) 4 MG tablet     Sig: Take 1 tablet (4 mg total) by mouth every 8 (eight) hours as needed for nausea for up to 7 days.     Dispense:  20 tablet     Refill:  0      Goals    None          Follow-Up:     Return in about 6 months (around 05/24/2021), or if symptoms worsen or fail to improve.    Brain Hilts M.D.    This document has been electronically signed and was completed in part using [employer]agan voice recognition software.  Despite being reviewed by the physician this note may contain typographical, grammar and/or voice recognition errors.    Electronically signed by Ruthine Dose, MD at 11/22/2020  6:50 AM EDT

## 2020-11-21 NOTE — Progress Notes (Signed)
Formatting of this note might be different from the original.  Elijah Perry    Your HIDA scan results are back and looks good.  This was read as normal hepatobiliary scan with ejection fraction.  At this point I recommend no changes and follow-up with GI as planned     If you have any questions or concerns, please feel free to give [place] a call or contact us via [place].    [name] M.D.  Electronically signed by Ruthine Dose, MD at 12/04/2020 11:01 AM EDT

## 2020-11-21 NOTE — Assessment & Plan Note (Signed)
Associated Problem(s): Diplopia  Formatting of this note might be different from the original.  Patient today was within normal limits, unable to reproduce symptoms  Recommend referral to ophthalmology for further evaluation recommendations  Electronically signed by Ruthine Dose, MD at 11/21/2020 11:42 AM EDT

## 2020-11-21 NOTE — Assessment & Plan Note (Signed)
Associated Problem(s): Lower urinary tract symptoms (LUTS)  Formatting of this note might be different from the original.  Reassured patient UA today was within normal limits, no evidence of a UTI.    Electronically signed by Ruthine Dose, MD at 11/21/2020 11:43 AM EDT

## 2020-11-21 NOTE — Assessment & Plan Note (Signed)
Associated Problem(s): Nausea (Resolved 02/11/2022)  Formatting of this note might be different from the original.  Possibly related to the gallbladder.  Will give a trial of Zofran 4 mg p.o. every 8 hours as needed  Electronically signed by Ruthine Dose, MD at 11/21/2020 11:41 AM EDT

## 2020-11-21 NOTE — Assessment & Plan Note (Signed)
Associated Problem(s): Vitamin D deficiency  Formatting of this note might be different from the original.  Currently on vitamin D2 50,000 units weekly  Vitamin D 25 hydroxy pending.  Will make recommendations pending those results.  Electronically signed by Ruthine Dose, MD at 11/22/2020  6:50 AM EDT

## 2020-11-21 NOTE — Assessment & Plan Note (Signed)
Associated Problem(s): Diabetes 1.5, managed as type 2 (CMS/HCC)  Formatting of this note is different from the original.  Diabetes control:  Well-controlled    Medication Compliance:  Compliant  Medication Management:     Continue metformin 500 mg once daily    Continue monitor diet regularly and will encourage continue work on weight reduction General    Recheck again in 6 months  Home glucose monitoring recommended      The patient?s care plan was reviewed and updated. Instructions and counseling were provided regarding patient goals and barriers. He was counseled to adopt a healthy lifestyle. Educational resources and self-management tools have been provided.     Electronically signed by Ruthine Dose, MD at 11/21/2020 11:37 AM EDT

## 2020-11-21 NOTE — Assessment & Plan Note (Signed)
Associated Problem(s): Calculus of gallbladder without cholecystitis without obstruction  Formatting of this note might be different from the original.  Did counsel patient that the gallbladder could be the source of his symptoms.  Since he is unable to get into GI until the end of the month we will go ahead and proceed with a HIDA scan.  If positive we will plan to refer to general surgery for evaluation possible cholecystectomy  Electronically signed by Ruthine Dose, MD at 11/21/2020 11:41 AM EDT

## 2020-11-21 NOTE — Assessment & Plan Note (Signed)
Associated Problem(s): Vitamin B12 deficiency  Formatting of this note might be different from the original.  History of vitamin B12 deficiency was receiving vitamin B12 IM monthly however has not had a shot in several months.  Vitamin B12 level pending.  Electronically signed by Ruthine Dose, MD at 11/21/2020 11:45 AM EDT

## 2021-05-16 NOTE — Assessment & Plan Note (Signed)
Associated Problem(s): Prostate cancer (CMS/HCC)  Formatting of this note might be different from the original.  Awaiting records from Dr. Ayesha Rumpf office but based on documentation low-volume low risk disease managed on active surveillance since 2015.  Negative MRI in 2021. Plan for annual PSA surveillance.  Electronically signed by Alric Quan, MD at 05/16/2021  3:40 PM EDT

## 2021-05-16 NOTE — Progress Notes (Signed)
Formatting of this note is different from the original.  Images from the original note were not included.  Subjective:     Chief Complaint:  Establish Care (NP: Recurrent UTI/TODAY: denies dysuria, hematuria, +/+U/F, - kidney pain, Nocturia x up Q 2hrs/Dr. Zenia Resides- [address], [place]/Hx: prostate cancer taking flomax, pt also has Myasthenia Gravis/PVR= 0 ml)    History of Present Illness:  80 y.o. male referred by Dr. Cathlean Sauer and seen today in consultation for recurrent UTI.    80 year old male with history of myasthenia gravis and sleep apnea.  He recently moved to [facility] and was followed by urology regularly.  Looking to reestablish care in [place].    Review of the chart through care everywhere made note of a CT urogram performed at [facility] July 16, 2019 at which time an obstructing stone was noted in the left mid ureter with hyperperfusion of the left kidney and delayed nephrogram.  Additional bilateral punctate nonobstructing stones are seen.  Benign simple cyst in the posterior left mid kidney.  Prostatic enlargement.  Sigmoid diverticulosis.    Records from Dr. Zenia Resides reviewed extensively.  History of prostate cancer dating back to 86.  Per report this was a low risk low-grade prostate cancer in 1 out of 12 biopsy cores with a PSA of 2.18 on 02/25/2019.  An MRI of the prostate was obtained which did not demonstrate any obvious lesions discerning for high risk prostate cancer.  He has BPH with lower urinary tract symptoms managed on tamsulosin and oxybutynin.  Post void residual has been minimal.  He was increased to tamsulosin twice daily.  Also has a history of nephrolithiasis CT urogram from February 2021 was as noted above.  Stone analysis demonstrated 100% uric acid stones.  He was started on Urocit-K.  Following his most recent stone procedure he was noted to have recurrent urinary tract infections.  Follow-up renal ultrasound following surgery demonstrated no hydronephrosis.  He had been treated  with a 28-month course of nitrofurantoin prophylaxis which she completed.  He is due for his annual PSA.    Recently diagnosed with myasthenia gravis.  No longer taking oxybutynin or potassiums citrate.  Has not had a urinary tract infection in several months.  Is bothered by urinary urgency and frequency particularly at nighttime.  Drinking a lot of iced tea for dinner and in the evenings.    Flank pain, fevers, chills, nausea or emesis.    AUA 18/3. IIEF 2.    Past Medical History:   Diagnosis Date    Anxiety     Arthritis     Left knee    Current moderate episode of major depressive disorder without prior episode (CMS/HCC) 07/23/2018    Diabetes mellitus (CMS/HCC)     Disease of thyroid gland     Gastroesophageal reflux disease without esophagitis 12/21/2020    Hx of cardiovascular stress test     Hx of colonoscopy     Kidney stones, calcium oxalate     Mild intermittent asthma without complication     Polyneuropathy     Prostate cancer (CMS/HCC)     Pure hypercholesterolemia     Uric acid nephrolithiasis      Past Surgical History:   Procedure Laterality Date    JOINT REPLACEMENT Right     knee    JOINT REPLACEMENT Right     hip    SHOULDER ARTHROSCOPY Right      Social History     Socioeconomic History  Marital status: Married     Spouse name: None    Number of children: None    Years of education: None    Highest education level: None   Occupational History    None   Tobacco Use    Smoking status: Former     Packs/day: 0.25     Years: 0.00     Pack years: 0.00     Types: Cigarettes     Quit date: 1968     Years since quitting: 54.8    Smokeless tobacco: Never   Vaping Use    Vaping Use: Never used   Substance and Sexual Activity    Alcohol use: Yes     Alcohol/week: 0.0 standard drinks     Comment: rare    Drug use: Never    Sexual activity: Not Currently     Partners: Female   Other Topics Concern    None   Social History Narrative    None     Social Determinants of Health     Financial Resource Strain: Not on  file   Food Insecurity: Not on file   Transportation Needs: Not on file   Physical Activity: Not on file   Stress: Not on file   Social Connections: Not on file   Intimate Partner Violence: Unknown    Has felt physically or emotionally unsafe where they currently live: Not on file    Physically hurt by someone in the past 12 months: Not on file    Humiliated or emotionally abused in past 12 months: Not on file   Housing Stability: Not on file     Review of Systems  ROS   Denies weight loss, decreased energy, vision changes, chest pain, difficulty breathing, blood in the stool, genital pain, vision     The following portions of the patient's history were reviewed and updated as appropriate: medications, allergies, past medical history, social history, surgical history, current problems.  All of the patient's ROS was normal except as documented in the HPI.    Objective and Data:       Lab/Rad review:   Radiology reports and available images and laboratory results from outside records were all personally reviewed.   Lab (past 36 hours): No results found for this or any previous visit (from the past 36 hour(s)).    Urine dip/microscopy personally reviewed    PVR 0 mL    No results found for: PSA      Chemistry      Component Value Date/Time    NA 140 01/24/2021 1030    K 4.4 01/24/2021 1030    CL 104 01/24/2021 1030    CO2 28 01/24/2021 1030    BUN 17 01/24/2021 1030    GLU 121 01/24/2021 1030     Component Value Date/Time    AST 16 11/21/2020 1135    ALT 13 11/21/2020 1135       There were no vitals filed for this visit.    Imaging: Reports and images reviewed personally either through scanned documents or CareEverywhere.    MRI Pelvis W Wo Contrast (Prostate)    Anatomical Region Laterality Modality   Pelvis -- Magnetic Resonance     Impression    1. Enlarged prostate with findings consistent with benign prostate hyperplasia.   2. No focal lesion identified meeting criteria for suspicion per PI-RADS. Ofnote Gleason 6  disease can be occult on imaging.    Narrative    EXAMINATION:  MAGNETIC RESONANCE IMAGING OF THE PELVIS WITHOUT AND WITH CONTRAST     HISTORY: Prostate cancer. Pathology results from a prostate biopsy performed 09/19/2015 demonstrated Gleason 3+3 = 6 disease within the left apex. Please note Gleason 6 disease can be occult on imaging.     TECHNIQUE: MR imaging of the prostate gland was performed prior to and following administration of intravenous gadolinium.   Protocol: Prostate   Contrast: 10 ml Gadavist     FINDINGS:   The prostate transition zone is enlarged with benign prostatic hyperplasia.     Susceptibility artifact from the right hip arthroplasty renders diffusion-weighted imaging nondiagnostic. T2-weighted imaging was degraded but still diagnostic.     Per the PI-RADS 2 scoring system ([employer]), there are no suspicious lesions in the prostate.     Staging Information: The seminal vesicles appear symmetric. No tumor involvement identified. The membranous urethra length is approximately 1.2 cm on series 2 image 11.     Prostate volume: 79.29 mL     Other Findings: Susceptibility artifact is present from the right hip arthroplasty. This limits assessment of the right hemipelvis. The left hemipelvic dominant arteries and veins are patent. No definitive pelvic lymphadenopathy. No suspicious marrow replacing lesion. Colonic diverticulosis. Urinary bladder wall thickening, likely secondary to a chronic outlet obstruction. Fat-containing right inguinal hernia. Synovitis of the L5-S1 facetjoints.    Procedure Note    Kingsley Plan, MD - 01/29/2019   Formatting of this note might be different from the original.   EXAMINATION: MAGNETIC RESONANCE IMAGING OF THE PELVIS WITHOUT AND WITH CONTRAST     HISTORY: Prostate cancer. Pathology results from a prostate biopsy performed 09/19/2015 demonstrated Gleason 3+3 = 6 disease within the left apex. Please note Gleason 6 disease  can be occult on imaging.     TECHNIQUE: MR imaging of the prostate gland was performed prior to and following administration of intravenous gadolinium.   Protocol: Prostate   Contrast: 10 ml Gadavist     FINDINGS:   The prostate transition zone is enlarged with benign prostatic hyperplasia.     Susceptibility artifact from the right hip arthroplasty renders diffusion-weighted imaging nondiagnostic. T2-weighted imaging was degraded but still diagnostic.     Per the PI-RADS 2 scoring system ([employer]ch), there are no suspicious lesions in the prostate.     Staging Information: The seminal vesicles appear symmetric. No tumor involvement identified. The membranous urethra length is approximately 1.2 cm on series 2 image 11.     Prostate volume: 79.29 mL     Other Findings: Susceptibility artifact is present from the right hip arthroplasty. This limits assessment of the right hemipelvis. The left hemipelvic dominant arteries and veins are patent. No definitive pelvic lymphadenopathy. No suspicious marrow replacing lesion. Colonic diverticulosis. Urinary bladder wall thickening, likely secondary to a chronic outlet obstruction. Fat-containing right inguinal hernia. Synovitis of the L5-S1 facet joints.     IMPRESSION:     1. Enlarged prostate with findings consistent with benign prostate hyperplasia.   2. No focal lesion identified meeting criteria for suspicion per PI-RADS. Ofnote Gleason 6 disease can be occult on imaging.  Exam End: 01/29/19 11:03    Specimen Collected: 01/29/19 11:03 Last Resulted: 01/29/19 12:06   Received From: [facility]  Result Received: 10/17/20 12:06       US Renal Complete    Anatomical Region Laterality Modality   Abdomen -- Ultrasound     Impression    Negative ultrasound of the kidneys. The urinary  bladder are almost completelyempty.    Narrative    Exam: Ultrasound of the Kidneys and Urinary Bladder     History:  Ureteral stone with  hydronephrosis status post ureteroscopic management     Technique: Real-time ultrasound of the kidneys and urinary bladder     Comparison:  Images from placement of a left ureteral stent     Findings:     KIDNEYS: The kidneys are of normal size and echogenicity. No renal mass, hydronephrosis, nephrolithiasis or perinephric fluid. Previously noted left hydronephrosis has resolved.   Right kidney: 12.1 cm   Left kidney:  12.8 cm     URINARY BLADDER: This patient voided immediately prior to this examination and therefore the bladder was not distended and is impossible to evaluate.     ADDITIONAL FINDINGS:  None.    Procedure Note    Heloise Purpura, MD - 09/17/2019   Formatting of this note might be different from the original.   Exam: Ultrasound of the Kidneys and Urinary Bladder     History:  Ureteral stone with hydronephrosis status post ureteroscopic management     Technique: Real-time ultrasound of the kidneys and urinary bladder     Comparison:  Images from placement of a left ureteral stent     Findings:     KIDNEYS: The kidneys are of normal size and echogenicity. No renal mass, hydronephrosis, nephrolithiasis or perinephric fluid. Previously noted left hydronephrosis has resolved.   Right kidney: 12.1 cm   Left kidney:  12.8 cm     URINARY BLADDER: This patient voided immediately prior to this examination and therefore the bladder was not distended and is impossible to evaluate.     ADDITIONAL FINDINGS:  None.     IMPRESSION:   Negative ultrasound of the kidneys. The urinary bladder are almost completelyempty.  Exam End: 09/17/19 11:18    Specimen Collected: 09/17/19 11:18 Last Resulted: 09/17/19 11:23   Received From: [facility]  Result Received: 10/17/20 12:06       CT Urogram    Anatomical Region Laterality Modality   Abdomen -- Computed Tomography   Pelvis -- --     Impression    1. Obstructing stone in the proximal to mid left ureter measuring 5 x 4 x 6 mm, with associated hypoperfusion of the  left kidney, perinephric and periureteric stranding, and proximal urothelial thickening and hyperenhancement. Superimposed upper urinary tract infection is not excluded. Correlate clinically.   2. Bilateral punctate nonobstructing intrarenal calculi as described above.   3. Benign simple cyst in the posterior left mid kidney. No solid masses are noted.   4. Prostatic enlargement.   5. Decompressed, thick-walled urinary bladder. Question chronic bladder outlet obstruction.   6. Sigmoid diverticulosis with no acute diverticulitis. Normal appendix.Hepatic steatosis. Cholelithiasis with no acute cholecystitis.    Narrative    Addendum Begins   Addendum report by:  Janean Sark, MD  on  06/05/2020.   Original report by: Janean Sark, MD  on  July 26, 2019.     No follow-up is required for the benign simple cyst in the posterior left mid kidney.   --------------------------------------------------------------------------------------------------------------------------------------------   Addendum Ends   Exam:  CT Urogram (3 Phase Protocol)     History:  Microscopic hematuria, left flank pain, nausea     Technique:  Pre-contrast CT was performed through the abdomen and pelvis to include the entire urinary tract (kidneys through bladder).  CT was then performed through the abdomen  during nephrographic phase of enhancement of the kidneys with IV contrast. Delayed, post-contrast CT was then performed through the abdomen and pelvis (to include the entire urinary tract) during the excretory phase of enhancement. This examination was tailored to optimize evaluation of the urinary tract. No oral contrast was administered.  IV contrast:  150 mL Omnipaque 300.  DLP:  1610 mGy-cm.  AEC (automated exposure control) and/or manual techniques such as size-specific kV and mAs are employed where appropriate to reduce radiation exposure for all CT exams. I-STAT creatinine level --  2.5 mg/dl.      Comparison:  Pelvis MRI from January 29, 2019     Abdomen and Pelvis Findings:     KIDNEYS/URETERS:  The left kidney demonstrates pelvocaliectasis and proximal ureterectasis extending to the mid ureter where there is an obstructing stone measuring 4.9 x 4.1 x 5.7 mm (image 74, series 2). Perinephric stranding is noted on the left.     Several additional nonobstructive intrarenal calculi are noted including a 2 mm calculus in the lower left kidney, and 2 punctate 1 to 2 mm calculi in the mid right kidney. No right ureteral calculi or hydronephrosis.     The left kidney demonstrates diminished, delayed enhancement after contrast administration. No solid masses are noted. There is a benign simple cyst in the posterior left mid kidney measuring 8 mm. Mild urothelial thickening and hyperenhancement are noted on the left in the renal pelvis and proximal ureter.     Delayed images demonstrate no additional filling defects in the renal collecting systems. There is no excretion on the left kidney at the time of delayed imaging.     BLADDER:  The urinary bladder is decompressed and demonstrates a thickened wall, unchanged. No definite filling defects.     LOWER THORAX:  The lung bases demonstrate minimal dependent atelectasis. No effusions or consolidation. Heart size is normal.     HEPATOBILIARY:  Minimal steatosis. The hepatic capsule is smooth. No focal liver lesions. Several calcified gallstones are noted in the gallbladder neck region. No evidence of acute cholecystitis.     PANCREAS:  Normal.     SPLEEN:  Normal.     ADRENALS:  Normal.     VASCULAR:  Atherosclerotic plaquing is present in the abdominal aorta without aneurysm. The IVC is normal.     LYMPH NODES:  No adenopathy.     BOWEL/MESENTERY:  The stomach, duodenum, small bowel, and colon are normal in caliber without dilatation or thickening. The appendix is visualized and is normal. No mesenteric masses or fluid collections. Sigmoid diverticuli are noted with no acute diverticulitis.     PELVIC ORGANS:   The prostate gland is enlarged. No pelvic free fluid.     BONES/SOFT TISSUES:  There are postoperative changes from right hiparthroplasty. Moderate degenerative changes in the lumbar spine are noted.    Procedure Note    Jacelyn Pi, MD - 06/05/2020   Formatting of this note might be different from the original.   Addendum Begins   Addendum report by:  Janean Sark, MD  on  06/05/2020.   Original report by: Janean Sark, MD  on  July 26, 2019.     No follow-up is required for the benign simple cyst in the posterior left mid kidney.   --------------------------------------------------------------------------------------------------------------------------------------------   Addendum Ends   Exam:  CT Urogram (3 Phase Protocol)     History:  Microscopic hematuria, left flank pain, nausea     Technique:  Pre-contrast CT was performed through the abdomen and pelvis to include the entire urinary tract (kidneys through bladder).  CT was then performed through the abdomen during nephrographic phase of enhancement of the kidneys with IV contrast. Delayed, post-contrast CT was then performed through the abdomen and pelvis (to include the entire urinary tract) during the excretory phase of enhancement. This examination was tailored to optimize evaluation of the urinary tract. No oral contrast was administered.  IV contrast:  150 mL Omnipaque 300.  DLP:  1761 mGy-cm.  AEC (automated exposure control) and/or manual techniques such as size-specific kV and mAs are employed where appropriate to reduce radiation exposure for all CT exams. I-STAT creatinine level --  2.5 mg/dl.      Comparison:  Pelvis MRI from January 29, 2019     Abdomen and Pelvis Findings:     KIDNEYS/URETERS:  The left kidney demonstrates pelvocaliectasis and proximal ureterectasis extending to the mid ureter where there is an obstructing stone measuring 4.9 x 4.1 x 5.7 mm (image 74, series 2). Perinephric stranding is noted on the left.     Several  additional nonobstructive intrarenal calculi are noted including a 2 mm calculus in the lower left kidney, and 2 punctate 1 to 2 mm calculi in the mid right kidney. No right ureteral calculi or hydronephrosis.     The left kidney demonstrates diminished, delayed enhancement after contrast administration. No solid masses are noted. There is a benign simple cyst in the posterior left mid kidney measuring 8 mm. Mild urothelial thickening and hyperenhancement are noted on the left in the renal pelvis and proximal ureter.     Delayed images demonstrate no additional filling defects in the renal collecting systems. There is no excretion on the left kidney at the time of delayed imaging.     BLADDER:  The urinary bladder is decompressed and demonstrates a thickened wall, unchanged. No definite filling defects.     LOWER THORAX:  The lung bases demonstrate minimal dependent atelectasis. No effusions or consolidation. Heart size is normal.     HEPATOBILIARY:  Minimal steatosis. The hepatic capsule is smooth. No focal liver lesions. Several calcified gallstones are noted in the gallbladder neck region. No evidence of acute cholecystitis.     PANCREAS:  Normal.     SPLEEN:  Normal.     ADRENALS:  Normal.     VASCULAR:  Atherosclerotic plaquing is present in the abdominal aorta without aneurysm. The IVC is normal.     LYMPH NODES:  No adenopathy.     BOWEL/MESENTERY:  The stomach, duodenum, small bowel, and colon are normal in caliber without dilatation or thickening. The appendix is visualized and is normal. No mesenteric masses or fluid collections. Sigmoid diverticuli are noted with no acute diverticulitis.     PELVIC ORGANS:  The prostate gland is enlarged. No pelvic free fluid.     BONES/SOFT TISSUES:  There are postoperative changes from right hip arthroplasty. Moderate degenerative changes in the lumbar spine are noted.     IMPRESSION:   1. Obstructing stone in the proximal to mid left ureter measuring 5 x 4 x 6 mm, with  associated hypoperfusion of the left kidney, perinephric and periureteric stranding, and proximal urothelial thickening and hyperenhancement. Superimposed upper urinary tract infection is not excluded. Correlate clinically.   2. Bilateral punctate nonobstructing intrarenal calculi as described above.   3. Benign simple cyst in the posterior left mid kidney. No solid masses are noted.   4. Prostatic enlargement.  5. Decompressed, thick-walled urinary bladder. Question chronic bladder outlet obstruction.   6. Sigmoid diverticulosis with no acute diverticulitis. Normal appendix.Hepatic steatosis. Cholelithiasis with no acute cholecystitis.  Exam End: 07/26/19 09:33    Specimen Collected: 07/26/19 09:33 Last Resulted: 07/26/19 09:51   Received From: [facility]  Result Received: 10/17/20 12:06       Physical Exam    General Appearance: alert, cooperative, well developed & nourished   Neurologic alert, oriented, normal speech   Psychiatric Normal mood and affect   Skin normal color   Respiratory no retractions and breathing unlabored   Cardiovascular No peripheral edema   Abdominal abdomen is soft without significant tenderness, masses, organomegaly or guarding       Assessment:     Encounter Diagnoses   Name Primary?    Kidney stones, calcium oxalate     Uric acid nephrolithiasis     BPH with obstruction/lower urinary tract symptoms     Prostate cancer (CMS/HCC) Yes     Prostate cancer (CMS/HCC)  Awaiting records from Dr. Ayesha Rumpf office but based on documentation low-volume low risk disease managed on active surveillance since 2015.  Negative MRI in 2021. Plan for annual PSA surveillance.    BPH with obstruction/lower urinary tract symptoms  Continue Flomax and oxybutynin.  PVR 0.  Predominant storage component of lower urinary tract symptoms.  We discussed elimination diet.  If no improvement would consider addition of Gemtesa versus mirabegron.    Uric acid nephrolithiasis  Continue potassiums citrate.  Referral  to kidney stone prevention program.       Plan:     Orders Placed This Encounter   Procedures    US Renal Bilateral    Prostate Specific Ag (PSA)    Referral to Kidney Stone Program     Medications Ordered This Encounter   Medications    tamsulosin (FLOMAX) 0.4 mg cap     Sig: Take 1 capsule (0.4 mg total) by mouth daily.     Dispense:  90 capsule     Refill:  3    potassium citrate 15 mEq TbER     Sig: Take 15 mEq by mouth 2 (two) times a day.     Dispense:  90 tablet     Refill:  3     FU 6 months with a renal US prior      Alric Quan, MD  05/16/2021 4:05 PM      Electronically signed by Alric Quan, MD at 05/16/2021  4:05 PM EDT

## 2021-05-16 NOTE — Assessment & Plan Note (Signed)
Associated Problem(s): BPH with obstruction/lower urinary tract symptoms  Formatting of this note might be different from the original.  Continue Flomax and oxybutynin.  PVR 0.  Predominant storage component of lower urinary tract symptoms.  We discussed elimination diet.  If no improvement would consider addition of Gemtesa versus mirabegron.  Electronically signed by Alric Quan, MD at 05/16/2021  4:01 PM EDT

## 2021-05-16 NOTE — Assessment & Plan Note (Signed)
Associated Problem(s): Uric acid nephrolithiasis  Formatting of this note might be different from the original.  Continue potassiums citrate.  Referral to kidney stone prevention program.   Electronically signed by Alric Quan, MD at 05/16/2021  3:41 PM EDT

## 2021-08-09 NOTE — Telephone Encounter (Signed)
Formatting of this note might be different from the original.  Refill on flowmax per protocol.   Electronically signed by Leandro Reasoner, RN at 08/09/2021  1:26 PM EST

## 2021-09-24 NOTE — Progress Notes (Signed)
Formatting of this note is different from the original.  Abstraction Result Flowsheet Data    This patient's last AWV date: [facility] Last Medicare Wellness Visit Date: 07/17/2017  This patients last WCC/CPE date: : Not Found    Reason for Encounter  Reason for Encounter: Outreach  Primary Reason for Outreach: AWV  Text Message: No  Outreach Call Outcome: Unable to contact                            Dear Laurey Arrow D. [facility] is committed to helping you stay healthy. Our records show you are due for a Medicare Annual Wellness Visit.    A benefit for anyone with Medicare, this visit is designed to help prevent illness based on your current health and risk factors--at no cost to you.     An Annual Wellness Visit may include education or counseling about immunizations, and important health measurements such as blood pressure checks, screenings, and referrals for other care if needed.    Please reply to this message with the word ?SCHEDULE,? and we will contact you to schedule your appointment.    Thank you for the opportunity to care for you.    Meredith Leeds, MD  [facility] [place]    Electronically signed by Glori Bickers, CMA at 09/24/2021  8:36 PM EDT

## 2021-10-30 LAB — HEMOGLOBIN A1C: Hemoglobin A1C: 5.5 %

## 2021-10-30 NOTE — Progress Notes (Signed)
Formatting of this note is different from the original.  Subjective:     Patient ID: Elijah Perry is a 81 y.o. male.        Chief Complaint:  Medication Refill (Questions about medication taken together) and spot on chest    [name] comes today to follow-up on medications:    #1.  Cough.  Patient's had a mild cough last couple weeks.  Patient reports a history of seasonal allergies but is currently not taking any antihistamines.    #2.  Unexplained weight loss.  Patient reports he thinks he is down about 50 pounds over the last year.  Review of chart, patient was first seen in March 2022 at that time was 257 pounds, down 30 pounds from then and down only 10 pounds over the last 5 months.  At his visit in March we made the diagnosis of myasthenia gravis he is being followed by neurology on a regular basis at this time.    #3.  Skin lesion right upper chest.  Patient states lesions been there for last couple of months and seems to be slowly enlarging.  On further questioning patient admits he actually has an appoint with dermatology later this afternoon.    #4.  Hypothyroidism.  Currently patient is on levothyroxine 50 mcg daily.  Patient admits that he frequently misses doses and may go 2 or 3 days without taking a dose.    Diarrhea with incontinence.  Patient notes that this has been a problem for a while now, states when he has the urge to go he is unable to hold it and currently has to wear adult diapers.  Patient states he is currently in a study for fecal incontinence at [facility] at this time.    PHQ-2 Score:      History of Present Illness:  HPI    The following portions of the patient's history were reviewed and updated as appropriate: medications, allergies, past medical history, family history, social history, surgical history, current problems. All of the patient's ROS was normal except as documented in the HPI.    Outpatient Medications Prior to Visit   Medication Sig Dispense Refill    ammonium lactate  (AMLACTIN) 12 % cream APPLY TO DRY PARTS OF BODY ONCE DAILY TO HANDS      diclofenac sodium (VOLTAREN) 1 % Gel gel Apply 2 application (2 g total) topically 4 (four) times a day.      ergocalciferol,vitamin D2, (DRISDOL) 1,250 mcg (50,000 unit) capsule Take 1 capsule (50,000 Units total) by mouth once a week. 12 capsule 1    fluticasone furoate-vilanteroL (BREO ELLIPTA) 200-25 mcg/dose Inhale 1 puff daily as needed. 2 each 3    levothyroxine (SYNTHROID) 50 MCG tablet Take 1 tablet (50 mcg total) by mouth daily. 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      rosuvastatin (CRESTOR) 40 MG tablet Take 1 tablet (40 mg total) by mouth daily. 90 tablet 3    tamsulosin (FLOMAX) 0.4 mg cap Take 1 capsule (0.4 mg total) by mouth daily. 90 capsule 3    escitalopram oxalate (LEXAPRO) 20 MG tablet Take  0.5 tablets (10 mg total) by mouth daily as needed.      potassium citrate 15 mEq TbER Take 15 mEq by mouth 2 (two) times a day. 90 tablet 3     No facility-administered medications prior to visit.     Review of Systems   Constitutional:  Positive for unexpected weight change. Negative for chills, fatigue and fever.   HENT:  Negative for congestion, sore throat, trouble swallowing and voice change.    Respiratory:  Positive for cough. Negative for shortness of breath and wheezing.    Cardiovascular:  Negative for chest pain and palpitations.   Gastrointestinal:  Negative for constipation, diarrhea, nausea and vomiting.   Musculoskeletal:  Negative for back pain and neck pain.   Skin:  Negative for rash and wound.     Objective:     BP 119/67 (BP Location: Right upper arm, BP Position: Sitting, Cuff Size: Large Adult Long ([place]))   Pulse 79   Temp  97.8 F (36.6 C) (Tympanic)   Ht 1.854 m (6' 0.99")   Wt (!) 103 kg (227 lb 2.9 oz)   SpO2 96%   BMI 29.98 kg/m       Physical Exam  Vitals and nursing note reviewed.   Constitutional:       General: He is not in acute distress.     Appearance: Normal appearance. He is not ill-appearing or toxic-appearing.   HENT:      Head: Normocephalic and atraumatic.      Right Ear: Tympanic membrane, ear canal and external ear normal.      Left Ear: Tympanic membrane, ear canal and external ear normal.      Nose: Nose normal. No congestion or rhinorrhea.      Mouth/Throat:      Mouth: Mucous membranes are moist.      Pharynx: Oropharynx is clear. No oropharyngeal exudate or posterior oropharyngeal erythema.   Eyes:      General: No scleral icterus.        Right eye: No discharge.         Left eye: No discharge.      Extraocular Movements: Extraocular movements intact.      Conjunctiva/sclera: Conjunctivae normal.      Pupils: Pupils are equal, round, and reactive to light.   Cardiovascular:      Rate and Rhythm: Normal rate and regular rhythm.      Pulses: Normal pulses.      Heart sounds: Normal heart sounds. No murmur heard.    No friction rub. No gallop.   Pulmonary:      Effort: Pulmonary effort is normal.      Breath sounds: Normal breath sounds. No stridor. No wheezing, rhonchi or rales.   Abdominal:      General: Abdomen is flat. Bowel sounds are normal.      Palpations: Abdomen is soft. There is no mass.      Tenderness: There is no abdominal tenderness. There is no right CVA tenderness, left CVA tenderness or guarding.      Hernia: No hernia is present.   Musculoskeletal:      Cervical back: Normal range of motion and neck supple. No rigidity or tenderness.   Skin:     General: Skin is warm and dry.      Comments: Patient has a slight erythematous raised lesion with a central keratotic crust noted right upper chest measuring approximately 1 cm in length by half centimeter in width.  Concerning for possible  squamous cell carcinoma   Neurological:      Mental Status: He is alert.         Assessment:     1. Acute cough    2. Hypothyroidism, unspecified type    3. Skin lesion    4. Vitamin D deficiency    5. Prediabetes    6. Myasthenia gravis (CMS/HCC)    7. Weight loss      Plan:     Acute cough  Constipation lungs were clear to auscultation today.  I suspect the cough may be related to his allergies.  I recommend he go ahead and start an over-the-counter Claritin or Zyrtec on a daily basis and see how he does with this.  If there is no improvement over the next week or 2 he should let us know.    Hypothyroidism  We will go ahead and check TSH with reflex free T4 today.  We will make recommendations pending those results.    Patient is currently on levothyroxine 50 mcg daily but has not been taking this regularly.    Skin lesion  Etiology of the lesion is unclear, advised patient this is could be a squamous cell carcinoma.  I would recommend consider excision.  Patient notes he does have an appointment scheduled with dermatology later this afternoon.  Advised patient make sure that he has this looked at at that visit.    Vitamin D deficiency  We will go and check a vitamin D 25-hydroxy level today.  We will make recommendations pending those results.    Prediabetes  A1c today was 5.5.    Recommend no changes, recheck again in 1 year    Myasthenia gravis (CMS/HCC)  Symptoms appear to be well controlled at this time.  Patient will continue to follow with neurology regularly    Weight loss  Etiology weight loss is unclear.  May be related to his chronic disease process, myasthenia gravis.    We will check some labs today to include a CMP, CBC and TSH with reflex free T4 and make further medications pending those results.  If these are within normal limits I recommend we continue tracking weight loss over the next couple of months and if he continues to lose weight unexpectedly we will consider abdominal CT at that  time.    Patient reports he had a normal colonoscopy just a couple years ago.    For prostate cancer and at this point they are just continue to monitor.        Orders Placed This Encounter   Procedures    Vitamin D 25-Hydroxy - In House    TSH with reflex to Free T4    CMP    CBC    POCT Glycosylated Hemoglobin (Hgb A1C)        Goals    None          Follow-Up:     Return in about 3 months (around 01/29/2022).    Brain Hilts M.D.    This document has been electronically signed and was completed in part using Dragan voice recognition software.  Despite being reviewed by the physician this note may contain typographical, grammar and/or voice recognition errors.      Electronically signed by Ruthine Dose, MD at 10/30/2021 12:27 PM EDT

## 2021-10-30 NOTE — Assessment & Plan Note (Signed)
Associated Problem(s): Acute cough (Resolved 02/11/2022)  Formatting of this note might be different from the original.  Constipation lungs were clear to auscultation today.  I suspect the cough may be related to his allergies.  I recommend he go ahead and start an over-the-counter Claritin or Zyrtec on a daily basis and see how he does with this.  If there is no improvement over the next week or 2 he should let us know.  Electronically signed by Ruthine Dose, MD at 10/30/2021 12:20 PM EDT

## 2021-10-30 NOTE — Assessment & Plan Note (Signed)
Associated Problem(s): Vitamin D deficiency  Formatting of this note might be different from the original.  We will go and check a vitamin D 25-hydroxy level today.  We will make recommendations pending those results.  Electronically signed by Ruthine Dose, MD at 10/30/2021 12:23 PM EDT

## 2021-10-30 NOTE — Assessment & Plan Note (Signed)
Associated Problem(s): Skin lesion (Resolved 02/11/2022)  Formatting of this note might be different from the original.  Etiology of the lesion is unclear, advised patient this is could be a squamous cell carcinoma.  I would recommend consider excision.  Patient notes he does have an appointment scheduled with dermatology later this afternoon.  Advised patient make sure that he has this looked at at that visit.  Electronically signed by Ruthine Dose, MD at 10/30/2021 12:23 PM EDT

## 2021-10-30 NOTE — Assessment & Plan Note (Signed)
Associated Problem(s): Hypothyroidism  Formatting of this note might be different from the original.  We will go ahead and check TSH with reflex free T4 today.  We will make recommendations pending those results.    Patient is currently on levothyroxine 50 mcg daily but has not been taking this regularly.  Electronically signed by Ruthine Dose, MD at 10/30/2021 12:22 PM EDT

## 2021-10-30 NOTE — Assessment & Plan Note (Signed)
Associated Problem(s): Weight loss  Formatting of this note might be different from the original.  Etiology weight loss is unclear.  May be related to his chronic disease process, myasthenia gravis.    We will check some labs today to include a CMP, CBC and TSH with reflex free T4 and make further medications pending those results.  If these are within normal limits I recommend we continue tracking weight loss over the next couple of months and if he continues to lose weight unexpectedly we will consider abdominal CT at that time.    Patient reports he had a normal colonoscopy just a couple years ago.    For prostate cancer and at this point they are just continue to monitor.  Electronically signed by Ruthine Dose, MD at 10/30/2021 12:27 PM EDT

## 2021-10-30 NOTE — Assessment & Plan Note (Signed)
Associated Problem(s): Prediabetes  Formatting of this note might be different from the original.  A1c today was 5.5.    Recommend no changes, recheck again in 1 year  Electronically signed by Ruthine Dose, MD at 10/30/2021 12:24 PM EDT

## 2021-10-30 NOTE — Assessment & Plan Note (Signed)
Associated Problem(s): Myasthenia gravis (CMS/HCC)  Formatting of this note might be different from the original.  Symptoms appear to be well controlled at this time.  Patient will continue to follow with neurology regularly  Electronically signed by Ruthine Dose, MD at 10/30/2021 12:24 PM EDT

## 2021-11-08 NOTE — Progress Notes (Signed)
Formatting of this note is different from the original.  Abstraction Result Flowsheet Data    This patient's last AWV date: [facility] Last Medicare Wellness Visit Date: 07/17/2017  This patients last WCC/CPE date: : Not Found    Reason for Encounter  Reason for Encounter: Outreach  Primary Reason for Outreach: AWV  Text Message: No  MyChart Message: No  Outreach Call Outcome: Changed PCP                          Dear Laurey Arrow D. [facility] is committed to helping you stay healthy. Our records show you are due for a Medicare Annual Wellness Visit.    A benefit for anyone with Medicare, this visit is designed to help prevent illness based on your current health and risk factors--at no cost to you.     An Annual Wellness Visit may include education or counseling about immunizations, and important health measurements such as blood pressure checks, screenings, and referrals for other care if needed.    Please reply to this message with the word ?SCHEDULE,? and we will contact you to schedule your appointment.    Thank you for the opportunity to care for you.    Meredith Leeds, MD  [facility] [place]    Electronically signed by Glori Bickers, CMA at 11/08/2021  2:29 PM EDT

## 2022-03-20 NOTE — Assessment & Plan Note (Signed)
Associated Problem(s): Ceruminosis, left  Formatting of this note might be different from the original.  Left ear was irrigated today, canals free of debris and TMs within normal limits  Electronically signed by Ruthine Dose, MD at 03/20/2022 10:58 AM EDT

## 2022-03-20 NOTE — Progress Notes (Signed)
Formatting of this note is different from the original.  Subjective:     Patient ID: Elijah Perry is a 81 y.o. male.    Is this a CM Visit?: No    Chief Complaint:  Medication Refill (Lab work. Referral [facility] [id number], can't sleep. Swollen feet./)    Patient has several concerns:    #1.  Patient comes in today notes he is got to be moving to [place] after a month on the 27th.  He notes his been a lot of stress and having hard time to sleep because of this.  Currently is not using any medications for this    #2.  Frequent urination at night.  Patient notes that he is up about 3 times at night to urinate.  Currently he is already on Flomax and this has helped somewhat but like to talk about other options.    #3.  Vitamin D deficiency.  Currently he is on vitamin D 2 50,000 IUs weekly.  He reports he is tolerant medication well no adverse side effects.  His last vitamin D level was done on 10/30/2021 and was just over the lower limits of normal    #4.  Hypothyroidism.  Currently does have a prescription for levothyroxine 50 mcg daily however he notes he has not been taking this medication, he notes he stopped a while ago.    #5.  Hyperlipidemia.  Currently he is on rosuvastatin 40 mg daily although he reports he does not always take this regularly.    PHQ-2 Score:      History of Present Illness:  HPI    The following portions of the patient's history were reviewed and updated as appropriate: medications, allergies, past medical history, family history, social history, surgical history, current problems. All of the patient's ROS was normal except as documented in the HPI.    Outpatient Medications Prior to Visit   Medication Sig Dispense Refill    ammonium lactate (AMLACTIN) 12 % cream APPLY TO DRY PARTS OF BODY ONCE DAILY TO HANDS      diclofenac sodium (VOLTAREN) 1 % Gel gel Apply 2 Applications (2 g total) topically 4 (four) times a day.      ergocalciferol,vitamin D2, (DRISDOL) 1,250 mcg (50,000 unit)  capsule TAKE 1 CAPSULE BY MOUTH 1 TIME A WEEK 12 capsule 1    escitalopram oxalate (LEXAPRO) 20 MG tablet Take 0.5 tablets (10 mg total) by mouth daily as needed.      levothyroxine (SYNTHROID) 50 MCG tablet Take 1 tablet (50 mcg total) by mouth daily. 90 tablet 3    metroNIDAZOLE (METROCREAM) 0.75 % cream APPLY THIN LAYER TOPICALLY TO FACE ONCE DAILY FOR ROSACEA      mometasone (ELOCON) 0.1 % cream APPLY TO THE AFFECTED AREA OF ECZEMA ON HANDS ONCE FOR 7 DAYS ONLY      potassium citrate 15 mEq TbER Take 15 mEq by mouth 2 (two) times a day. 90 tablet 3    pyridostigmine (MESTINON) 60 mg tablet Take 0.5 tablets (30 mg total) by mouth 3 (three) times a day.      tamsulosin (FLOMAX) 0.4 mg cap Take 1 capsule (0.4 mg total) by mouth daily. 90 capsule 3    rosuvastatin (CRESTOR) 40 MG tablet Take 1 tablet (40 mg total) by mouth daily. 90 tablet 3    fluticasone furoate-vilanteroL (BREO ELLIPTA) 200-25 mcg/dose Inhale 1 puff daily as needed. 2 each 3     No facility-administered medications prior to  visit.     Review of Systems   Constitutional:  Negative for chills, fatigue and fever.   HENT:  Negative for congestion.    Respiratory:  Negative for cough, shortness of breath and wheezing.    Cardiovascular:  Negative for chest pain, palpitations and leg swelling.   Gastrointestinal:  Negative for diarrhea, nausea and vomiting.   Genitourinary:  Positive for difficulty urinating and frequency (Up 3 times at night to urinate). Negative for dysuria, flank pain, hematuria and urgency.       Objective:     BP 128/57 (BP Location: Right upper arm, BP Position: Sitting, Cuff Size: Large Adult [place]))   Pulse 72   Temp 98.5 F (36.9 C) (Tympanic)   Resp 18   Ht 1.854 m (6' 0.99")   Wt (!) 109.1 kg (240 lb 8.4 oz)   SpO2 95%   BMI 31.74 kg/m       Physical Exam  Vitals and nursing note reviewed.   Constitutional:       General: He is not in acute distress.     Appearance: Normal appearance. He is obese. He is not  ill-appearing or toxic-appearing.   HENT:      Head: Normocephalic and atraumatic.      Right Ear: Tympanic membrane, ear canal and external ear normal.      Left Ear: Tympanic membrane normal.      Ears:      Comments: Moderate cerumenosis noted on the left     Nose: Nose normal.      Mouth/Throat:      Mouth: Mucous membranes are moist.      Pharynx: Oropharynx is clear. No oropharyngeal exudate or posterior oropharyngeal erythema.   Eyes:      General: No scleral icterus.        Right eye: No discharge.         Left eye: No discharge.      Extraocular Movements: Extraocular movements intact.      Conjunctiva/sclera: Conjunctivae normal.      Pupils: Pupils are equal, round, and reactive to light.   Cardiovascular:      Rate and Rhythm: Normal rate and regular rhythm.      Pulses: Normal pulses.      Heart sounds: Murmur heard.   Pulmonary:      Effort: Pulmonary effort is normal.      Breath sounds: Normal breath sounds. No stridor. No wheezing, rhonchi or rales.   Musculoskeletal:      Cervical back: Normal range of motion and neck supple. No rigidity or tenderness.      Right lower leg: No edema.      Left lower leg: No edema.   Lymphadenopathy:      Cervical: No cervical adenopathy.   Skin:     General: Skin is warm and dry.      Findings: No rash.   Neurological:      Mental Status: He is alert.         Assessment:     1. Psychophysiological insomnia    2. BPH with obstruction/lower urinary tract symptoms    3. Vitamin D deficiency    4. Hypothyroidism, unspecified type    5. Ceruminosis, left    6. Pure hypercholesterolemia    7. Wheezing      Plan:     Psychophysiological insomnia  At this point I would recommend trial of over-the-counter melatonin.  If this is not effective we could  consider trazodone.    BPH with obstruction/lower urinary tract symptoms  At this point I would recommend he continues Flomax and I have placed referral back to urology for their evaluation recommendations as well.    Vitamin D  deficiency  Plan continue vitamin D2 50,000 IUs weekly and we we will go ahead and recheck a vitamin D 25-hydroxy level today.  If still low normal advised patient may go ahead and add an additional 2000 IUs of over-the-counter vitamin D3 daily    Hypothyroidism  Had previously been on levothyroxine 50 mcg daily however has been off of this for a number of months now.  Patient notes he discontinued this as he felt he was on too many medications.    We will check TSH with reflex free T4 make recommendations pending those results.    Ceruminosis, left  Left ear was irrigated today, canals free of debris and TMs within normal limits    Pure hypercholesterolemia  Currently on rosuvastatin 40 mg daily and tolerating this well.  Weight no change at this time.  The refill for another 3 months.      Orders Placed This Encounter   Procedures    Ear wax removal    Vitamin D 25-Hydroxy - In House    TSH with reflex to Free T4    Referral to Urology     Medications Ordered This Encounter   Medications    fluticasone furoate-vilanteroL (BREO ELLIPTA) 200-25 mcg/dose     Sig: Inhale 1 puff daily as needed.     Dispense:  2 each     Refill:  3    rosuvastatin (CRESTOR) 40 MG tablet     Sig: Take 1 tablet (40 mg total) by mouth daily.     Dispense:  90 tablet     Refill:  0      Goals    None          Follow-Up:     Return if symptoms worsen or fail to improve.    Advised patient he will need to establish care with a new PCP in [place] once he arrives    Brain Hilts M.D.    This document has been electronically signed and was completed in part using Dragan voice recognition software.  Despite being reviewed by the physician this note may contain typographical, grammar and/or voice recognition errors.      Electronically signed by Ruthine Dose, MD at 03/20/2022 11:06 AM EDT

## 2022-03-20 NOTE — Assessment & Plan Note (Signed)
Associated Problem(s): Psychophysiological insomnia  Formatting of this note might be different from the original.  At this point I would recommend trial of over-the-counter melatonin.  If this is not effective we could consider trazodone.  Electronically signed by Ruthine Dose, MD at 03/20/2022 10:53 AM EDT

## 2022-03-20 NOTE — Progress Notes (Signed)
Formatting of this note might be different from the original.  Elijah Perry    Your lab results are back.    Your thyroid function test are within normal limits.  I would recommend you continue levothyroxine 50 mcg daily and recheck this again in 1 year.    Your vitamin D level remains a little bit low.  I recommend you continue your vitamin D2, 50,000 IUs weekly and recommend you add an additional over-the-counter vitamin D3 2000 IUs daily and then consider rechecking this in 3 to 6 months.     If you have any questions or concerns, please feel free to give [place] a call or contact us via [place].    [name] M.D.    Electronically signed by Ruthine Dose, MD at 03/21/2022  6:40 AM EDT

## 2022-03-20 NOTE — Assessment & Plan Note (Signed)
Associated Problem(s): BPH with obstruction/lower urinary tract symptoms  Formatting of this note might be different from the original.  At this point I would recommend he continues Flomax and I have placed referral back to urology for their evaluation recommendations as well.  Electronically signed by Ruthine Dose, MD at 03/20/2022 10:54 AM EDT

## 2022-03-20 NOTE — Assessment & Plan Note (Signed)
Associated Problem(s): Hypothyroidism  Formatting of this note might be different from the original.  Had previously been on levothyroxine 50 mcg daily however has been off of this for a number of months now.  Patient notes he discontinued this as he felt he was on too many medications.    We will check TSH with reflex free T4 make recommendations pending those results.      Electronically signed by Ruthine Dose, MD at 03/20/2022 10:58 AM EDT

## 2022-03-20 NOTE — Assessment & Plan Note (Signed)
Associated Problem(s): Vitamin D deficiency  Formatting of this note might be different from the original.  Plan continue vitamin D2 50,000 IUs weekly and we we will go ahead and recheck a vitamin D 25-hydroxy level today.  If still low normal advised patient may go ahead and add an additional 2000 IUs of over-the-counter vitamin D3 daily  Electronically signed by Ruthine Dose, MD at 03/20/2022 10:57 AM EDT

## 2022-03-20 NOTE — Assessment & Plan Note (Signed)
Associated Problem(s): Pure hypercholesterolemia  Formatting of this note might be different from the original.  Currently on rosuvastatin 40 mg daily and tolerating this well.  Weight no change at this time.  The refill for another 3 months.  Electronically signed by Ruthine Dose, MD at 03/20/2022 11:00 AM EDT

## 2022-05-13 NOTE — ED Notes (Signed)
Formatting of this note might be different from the original.  Pain assessment on discharge was 0.  Condition stable.  Patient discharged to home.  Patient education was completed:  yes  Education taught to:  patient  Teaching method used was discussion.  Understanding of teaching was good.  Patient was discharged via wheelchair.  Discharged with family.  Valuables were given to: family.    Electronically signed by Renne Musca, RN at 05/13/2022  8:29 PM EDT

## 2022-05-13 NOTE — ED Provider Notes (Signed)
Formatting of this note is different from the original.    [place]    Time of Arrival:   05/13/22 1304    Final diagnoses:   [R10.84] Abdominal pain, generalized   [R10.9] Bilateral flank pain   [R11.2, R19.7] Nausea vomiting and diarrhea   [K59.00] Constipation, unspecified constipation type   [N20.0] Right kidney stone (Primary)   [K80.20] Calculus of gallbladder without cholecystitis without obstruction     MEDICAL DECISION MAKING (MDM)    Differential Diagnosis:  Differential diagnosis includes, but is not limited to: Gastroenteritis, colitis, diverticulitis, pancreatitis, hepatitis, gastritis, ACS, arrhythmia, electrolyte disturbance, dehydration, medication side effect    ED Course:     Initial vital signs with tachycardia. Vital signs otherwise reassuring. Afebrile.  Pleasant 81 y.o. male who presents to the ED with abdominal pain, constipation, nausea, vomiting, diarrhea.  Physical exam with right-sided abdominal tenderness.  No flank tenderness.  Appears well, nontoxic.  No active retching.  Will treat pain and nausea, obtain labs and imaging.  Will hydrate with 1 L LR.  CBC with with minimal leukocytosis.  No anemia.  BMP without AKI or significant electrolyte derangement.  Hepatic panel reassuring.  Lipase within normal limits.  Urinalysis without convincing evidence of infection or bleeding.  EKG without evidence of acute ischemia or malignant arrhthmia.  Serial troponins reassuring without concerning increase between measurements.  CT imaging with 5 mm obstructing stone at the right UVJ.  Also notes possible ileus although patient is having diarrhea today so I doubt obstruction.  We will start Rocephin given leukocytosis and give prescription for Keflex.  Reassessed patient. Mr. Teed is resting comfortably on stretcher in no acute distress. Repeat physical exam reassuring.  Feels much better compared to initial presentation.. They are comfortable with plan for  discharge home. Advised close follow-up with Primary care provider and Urology. Urged immediate return to the ED for any fever, chills, intractable pain or vomiting, or any new, concerning, or worsening symptoms. Mr. Wahlen voiced understanding of and agreement with this plan.      Documentation/Prior Results Review:   Along with other records, I have reviewed:  Nursing notes    Rhythm interpretation from monitor:   None    Supplemental Historians include:    Patient  Medical records  Relative    Social Determinants of Health:   Social factors were reviewed and did not limit treatment    Imaging Interpreted by me:   CT abdomen/pelvis without evidence of free air or bowel obstruction by my read.     Discussion of Mangement with other Physicians, QHP or Appropriate Source (Consults):       7:35 PM  Consult:  Discussed case with [facility] (Urology).   Standard discussion, including history, chief complaint, available diagnostic results, and treatment course thus far.   Discussed disposition (admission vs. observation vs. discharge).  They recommend discharge home.  Agree with antibiotic's, pain control.  They will have clinic follow-up in the short-term to arrange close follow-up.       .                              As of 05/13/2022, 2:43 PM  The differential diagnosis and / or critical care lists were considered including infections, sepsis, severe sepsis, and septic shock and found unlikely unless otherwise documented in the final clinical impression or diagnosis list.     .  Disposition:    Home    Discharge Medication List as of 05/13/2022  8:19 PM       START taking these medications    Details   acetaminophen (TYLENOL) 500 mg PO TABS Take 2 Tabs by Mouth Every 8 Hours As Needed., Disp-20 Tab, R-0, Normal     cephALEXin (KEFLEX) 500 mg PO CAPS Take 1 Cap by Mouth Every 12 hours for 7 days., Disp-14 Cap, R-0, Normal     docusate sodium (DULCOLAX STOOL SOFTENER, DSS,) 100 mg PO CAPS Take 1 Cap by Mouth Once a Day  for 30 days., Disp-30 Cap, R-0, Normal     naloxone (NARCAN) 4 mg/actuation NA nasal spray 1 Spray by Intranasal route As Directed for Opiate Overdose/Reversal. Administer 1 spray into 1 nostril. May repeat in 2 - 3 mins if needed. Call 911, Disp-2 Each, R-0, Normal     ondansetron (ZOFRAN) 4 mg PO ODT. Take 1 Tab by Mouth Every 8 Hours As Needed., Disp-12 Tab, R-0, Normal     oxyCODONE (ROXICODONE) 5 mg PO TABS Take 1 Tab by Mouth Every 6 Hours As Needed for Severe Pain (Pain Score 7-10) or Moderate Pain (Pain Score 4-6)., Disp-12 Tab, R-0, Normal     tamsulosin (FLOMAX) 0.4 mg PO CAPS Take 1 Cap by Mouth Every Night at Bedtime., Disp-14 Cap, R-0, Normal         Chief Complaint   Patient presents with    ABDOMINAL PAIN     History of Present Illness/Injury  Mr. Azarel Banner is a 81 y.o. male with history of myasthenia gravis, hyperlipidemia, possible prostate cancer, BPH who presents to the emergency department for abdominal pain, constipation, nausea, vomiting, diarrhea.    2 weeks of intermittent diarrhea and constipation with diffuse abdominal and bilateral flank pain, nonbloody, nonbilious emesis.  Denies chest pain, shortness of breath, cough, fever, chills, urinary changes, all other symptoms.  Went to an urgent care earlier today brie and had labs and imaging but does not know the result.  He was referred here for further evaluation.  Has never had symptoms like this in the past.  Denies any history of abdominal procedures.    Review of Systems  As per HPI.    Physical Exam  Vitals and nursing note reviewed.   Constitutional:       General: He is awake. He is not in acute distress.     Appearance: He is not toxic-appearing or diaphoretic.   HENT:      Head: Normocephalic and atraumatic.   Eyes:      Conjunctiva/sclera: Conjunctivae normal.   Cardiovascular:      Rate and Rhythm: Normal rate and regular rhythm.      Heart sounds: Normal heart sounds, S1 normal and S2 normal. Heart sounds not distant. No murmur  heard.     No friction rub. No gallop.   Pulmonary:      Effort: Pulmonary effort is normal. No tachypnea, accessory muscle usage, prolonged expiration or respiratory distress.      Breath sounds: Normal breath sounds. No stridor or decreased air movement. No decreased breath sounds, wheezing, rhonchi or rales.      Comments: Normal respiratory effort. Speaking easily in full sentences without difficulty.  Abdominal:      General: There is no distension.      Palpations: Abdomen is soft. There is no fluid wave, hepatomegaly, splenomegaly, mass or pulsatile mass.      Tenderness: There is abdominal tenderness  in the right upper quadrant and right lower quadrant. There is no right CVA tenderness, left CVA tenderness, guarding or rebound.   Musculoskeletal:         General: Normal range of motion.      Cervical back: Normal range of motion.      Comments: Moves all extremities.    Skin:     General: Skin is warm and dry.   Neurological:      General: No focal deficit present.      Mental Status: He is alert.   Psychiatric:         Behavior: Behavior is cooperative.     No past medical history on file.  No past surgical history on file.  No family history on file.  Social History     Occupational History    Not on file   Tobacco Use    Smoking status: Not on file    Smokeless tobacco: Not on file   Substance and Sexual Activity    Alcohol use: Not on file    Drug use: Not on file    Sexual activity: Not on file     Outpatient Medications Marked as Taking for the 05/13/22 encounter [facility] Encounter)   Medication Sig Dispense Refill    acetaminophen (TYLENOL) 500 mg PO TABS Take 2 Tabs by Mouth Every 8 Hours As Needed. 20 Tab 0    cephALEXin (KEFLEX) 500 mg PO CAPS Take 1 Cap by Mouth Every 12 hours for 7 days. 14 Cap 0    docusate sodium (DULCOLAX STOOL SOFTENER, DSS,) 100 mg PO CAPS Take 1 Cap by Mouth Once a Day for 30 days. 30 Cap 0    naloxone (NARCAN) 4 mg/actuation NA nasal spray 1 Spray by Intranasal route As  Directed for Opiate Overdose/Reversal. Administer 1 spray into 1 nostril. May repeat in 2 - 3 mins if needed. Call 911 2 Each 0    ondansetron (ZOFRAN) 4 mg PO ODT. Take 1 Tab by Mouth Every 8 Hours As Needed. 12 Tab 0    oxyCODONE (ROXICODONE) 5 mg PO TABS Take 1 Tab by Mouth Every 6 Hours As Needed for Severe Pain (Pain Score 7-10) or Moderate Pain (Pain Score 4-6). 12 Tab 0    tamsulosin (FLOMAX) 0.4 mg PO CAPS Take 1 Cap by Mouth Every Night at Bedtime. 14 Cap 0     Allergies   Allergen Reactions    Adhesive Tape-Silicones rash/itching     Vital Signs:  Patient Vitals for the past 72 hrs:   Temp Heart Rate Pulse Resp BP BP Mean SpO2 Weight   05/13/22 1549 99.9 F (37.7 C) 92 92 -- 141/73 (!) 101 MM HG 93 % --   05/13/22 1311 -- -- -- -- -- -- -- 108 kg (238 lb)   05/13/22 1310 99.1 F (37.3 C) 110 -- 18 145/103 (!) 117 MM HG 92 % --     Diagnostics:  Labs:    Results for orders placed or performed during the hospital encounter of 05/13/22   Comprehensive Metabolic Panel   Result Value Ref Range    Potassium 4.0 3.5 - 5.5 mmol/L    Sodium 141 133 - 145 mmol/L    Chloride 104 98 - 110 mmol/L    Glucose 116 (H) 70 - 99 mg/dL    Calcium 9.9 8.4 - 10.5 mg/dL    Albumin 4.4 3.5 - 5.0 g/dL    SGPT (ALT) 14 5 - 40 U/L  SGOT (AST) 22 10 - 37 U/L    Bilirubin Total 1.0 0.2 - 1.2 mg/dL    Alkaline Phosphatase 52 40 - 125 U/L    BUN 18 6 - 22 mg/dL    CO2 27 20 - 32 mmol/L    Creatinine 1.6 0.8 - 1.6 mg/dL    eGFR 42.7 (L) >60.0 mL/min/1.73 sq.m.    Globulin 3.6 2.0 - 4.0 g/dL    A/G Ratio 1.2 1.1 - 2.6 ratio    Total Protein 8.0 6.2 - 8.1 g/dL    Anion Gap 10.0 3.0 - 15.0 mmol/L   CBC WITH DIFFERENTIAL AUTO   Result Value Ref Range    WBC 12.5 (H) 4.0 - 11.0 K/uL    RBC 4.95 3.80 - 5.80 M/uL    HGB 15.3 12.6 - 17.1 g/dL    HCT 46.3 37.8 - 52.2 %    MCV 94 80 - 95 fL    MCH 31 26 - 34 pg    MCHC 33 31 - 36 g/dL    RDW 12.9 10.0 - 15.5 %    Platelet 272 140 - 440 K/uL    MPV 10.0 9.0 - 13.0 fL    Segmented Neutrophils  (Auto) 76 (H) 40 - 75 %    Lymphocytes (Auto) 12 (L) 20 - 45 %    Monocytes (Auto) 10 3 - 12 %    Eosinophils (Auto) 1 0 - 6 %    Basophils (Auto) 0 0 - 2 %    Absolute Neutrophils (Auto) 9.5 (H) 1.8 - 7.7 K/uL    Absolute Lymphocytes (Auto) 1.6 1.0 - 4.8 K/uL    Absolute Monocytes (Auto) 1.3 (H) 0.1 - 1.0 K/uL    Absolute Eosinophils (Auto) 0.1 0.0 - 0.5 K/uL    Absolute Basophils (Auto) 0.0 0.0 - 0.2 K/uL   Lipase   Result Value Ref Range    Lipase 34 7 - 60 U/L   Troponin Care Path   Result Value Ref Range    Troponin (T) Quant High Sensitivity (5th Gen) 26 (H) 0 - 19 ng/L   Troponin 1 HR   Result Value Ref Range    Troponin (T) Quant High Sensitivity (5th Gen) 24 (H) 0 - 19 ng/L   Troponin 3 HR   Result Value Ref Range    Troponin (T) Quant High Sensitivity (5th Gen) 24 (H) 0 - 19 ng/L   Urinalysis w Micro Reflex Culture    Specimen: Clean Catch Urine   Result Value Ref Range    Source Urine      Urine Color Yellow Colorless, Pale Yellow, Light Yellow, Yellow, Dark Yellow, Straw    Urine Clarity Cloudy (A) Clear, Slightly Cloudy    Urine pH 5.5 5.0 - 8.0 pH    Urine Protein Screen 100* (A) Negative, Trace mg/dL    Urine Glucose Negative Negative mg/dL    Urine Ketones Negative Negative mg/dL    Urine Occult Blood Negative Negative    Urine Specific Gravity 1.024 1.005 - 1.030    Urine Nitrite Negative Negative    Urine Leukocyte Esterase Negative Negative    Urine Bilirubin Negative Negative    Urine Urobilinogen 0.2 <2.0 mg/dL mg/dL    Urine RBC 0-2 Negative, 0-2 /hpf    Urine WBC 0-2 0 - 2 /hpf    Urine Bacteria Negative Negative    Squamous Epithelial Cells 0-2 None, 0-2 /hpf    Hyaline Cast 0-2 0 - 2 /lpf  ECG:  Results for orders placed or performed during the hospital encounter of 05/13/22   EKG 12 LEAD UNIT PERFORMED   Result Value Ref Range Status    Heart Rate 87 bpm Final    RR Interval 688 ms Final    Atrial Rate 88 ms Final    P-R Interval 191 ms Final    P Duration 118 ms Final    P Horizontal  Axis 1 deg Final    P Front Axis 31 deg Final    Q Onset 500 ms Final    QRSD Interval 90 ms Final    QT Interval 349 ms Final    QTcB 421 ms Final    QTcF 395 ms Final    QRS Horizontal Axis -48 deg Final    QRS Axis 66 deg Final    I-40 Front Axis 18 deg Final    t-40 Horizontal Axis -86 deg Final    T-40 Front Axis 124 deg Final    T Horizontal Axis 69 deg Final    T Wave Axis -6 deg Final    S-T Horizontal Axis 94 deg Final    S-T Front Axis 13 deg Final    Impression - BORDERLINE ECG -  Final    Impression SR-Sinus rhythm-normal P axis, V-rate 50-99  Final    Impression   Final     T0IN-Borderline T abnormalities, inferior leads-T flat/neg, II III aVF    Impression -No ECG for comparison. No STEMI criteria.-  Final     Imaging:  CT ABD/PELVIS-IV ONLY   Final Result       1. There is a 5 mm obstructing stone at the distal right ureter (approximately 2 cm from the right UVJ) with associated moderate hydronephrosis and hydroureter. The delayed nephrographic enhancement of the right kidney indicate a high-grade obstruction. There are no residual stones in either kidney.     2. The pattern of prominent fluid and dilatation in the distal small bowel may represent an associated adynamic ileus pattern.     3. Cholelithiasis without acute cholecystitis.         Signed By: Rockwell Germany, MD on 05/13/2022 4:48 PM       EKG 12 LEAD UNIT PERFORMED   Final Result       Medications ordered/given in the ED:  Medications   morphine injection 4 mg (4 mg IV Push Given 05/13/22 1513)   ondansetron (PF) (Zofran) injection 4 mg (4 mg Intravenous Given 05/13/22 1513)   Lactated Ringers (LR) infusion (0 mL Intravenous stopped 05/13/22 2011)   iodixanoL (VisiPaque) 320 mg iodine/mL 80 mL (80 mL Intravenous Given 05/13/22 1626)   cefTRIAXone (Rocephin) 1 g in SWFI 10 mL syringe (1 g Intravenous Given 05/13/22 1959)   tamsulosin (Flomax) capsule 0.4 mg (0.4 mg Oral Given 05/13/22 1959)       Electronically signed by Azucena Fallen,  MD at 05/13/2022 11:21 PM EDT

## 2022-05-13 NOTE — ED Triage Notes (Signed)
Formatting of this note might be different from the original.  Pt complain of abd pain with constipation and NVD for two weeks  Electronically signed by Jarrett Soho, RN at 05/13/2022  1:10 PM EDT

## 2022-05-14 NOTE — Telephone Encounter (Signed)
Patient scheduled for appt on 05/17/2022    Ephraim Hamburger [place] F, PA  Betti Cruz,     This patient will need definitive stone treatment please.     Thank you!   Dollene [place], PA-C       - Stone type: simple   - Surgery recommended: R URS   - Obstructed/stented: no   - Infected/last culture: ucx pending     - Needs pre-op clinic appt -yes   - Location of CT: [facility]   - Okay for ASC - yes   - Anticoagulant/antiplatelet to stop - no

## 2022-05-17 ENCOUNTER — Encounter

## 2022-05-21 ENCOUNTER — Ambulatory Visit: Admit: 2022-05-21 | Discharge: 2022-05-21 | Payer: MEDICARE | Attending: Urology

## 2022-05-21 DIAGNOSIS — N201 Calculus of ureter: Secondary | ICD-10-CM

## 2022-05-21 LAB — AMB POC URINALYSIS DIP STICK AUTO W/O MICRO
Bilirubin, Urine, POC: NEGATIVE
Glucose, Urine, POC: NEGATIVE
Ketones, Urine, POC: NEGATIVE
Nitrite, Urine, POC: NEGATIVE
Protein, Urine, POC: NEGATIVE
Specific Gravity, Urine, POC: 1.025 (ref 1.001–1.035)
Urobilinogen, POC: NORMAL
pH, Urine, POC: 5.5 (ref 4.6–8.0)

## 2022-05-21 NOTE — Progress Notes (Signed)
KUB performed today per Dr. Fabrizio.

## 2022-05-21 NOTE — Progress Notes (Signed)
Elijah Perry  DOB: 11-15-1940   Encounter Date: 05/21/2022     ASSESSMENT:    ICD-10-CM    1. Right ureteral stone  N20.1 AMB POC URINALYSIS DIP STICK AUTO W/O MICRO     CT ABDOMEN PELVIS WO CONTRAST Additional Contrast? Radiologist Recommendation     CANCELED: CT ABDOMEN PELVIS WO CONTRAST Additional Contrast? Radiologist Recommendation      2. Malignant neoplasm of prostate ([place])  C61              - Nephrolithiasis    Surgical History:    S/p cystoscopy, left ureteroscopy, and left ureteral stent placement on 08/03/2019. Patient's left ureteral stone was unable to be addressed due to relative stenosis of the left ureter   S/p cysto, left URS, basket stone extraction, left RPG, left ureteral stent exchange on 08/17/19 with Dr. Zenia Resides.      Medical Therapy: N/A   Stone Composition: 08/2019 (100% Uric Acid).    24 Hr Urine Panel: N/A     Imaging:    CTU 07/26/19:  The left kidney demonstrates pelvocaliectasis and proximal ureterectasis extending to the mid ureter where there is an 5.7 mm obstructing stone.  CT A/P 05/13/22: Asymmetric moderate hydronephrosis of the right kidney with delayed nephrographic enhancement. There is hydroureter to the level of the pelvis where there is an obstructing 5 mm calculus lying approximately 2 cm above the right UVJ.    - Prostate Cancer, GS 3+3 in 1/12 cores per previous Urologic records diagnosed s/p TRUS biopsy on 09/28/13. Pathology report not on file.     On active surveillance.    S/p TRUS biopsy 01/19/16. Pathology report not on file.    MRI Prostate 01/29/19: There are no suspicious lesions in the prostate. The prostate transition zone is enlarged with benign prostatic hyperplasia. Prostate volume: 79.29 mL    Most recent PSA was 5.25 ng/mL on 05/16/21.     - BPH with LUTS. Taking Flomax and Oxybutynin     - PMH: Myasthenia Gravis.     PLAN:    Reviewed and interpreted CT A/P 05/13/22: Asymmetric moderate hydronephrosis of the right kidney with delayed nephrographic enhancement.  There is hydroureter to the level of the pelvis where there is an obstructing 5 mm calculus lying approximately 2 cm above the right UVJ.  Recommended CT A/P wo cont to reevaluate for right ureteral stone. R/B reviewed.   Will plan for MRI Prostate w/wo cont and PSA for surveillance of patient's prostate cancer once patient's acute stone episode has resolved.   STAT CT A/P wo cont ordered today to be done now.   Will see if patient can be arranged for KUB today.   Follow up in 1 week with extender to review CT A/P wo cont and KUB. DRE at this time.   I discussed the fact that we do assure that he is cleared stone given the fact that he has a ureteral stricture.  Complains of back present times and culture recommending a CT scan and follow-up about 1  Hydronephrosis and evidence of stones above procedure we discussed guidelines for ureteral stent today.      Juanda Bond. Fatima Sanger MD FACS  Professor of Urology   [facility]  [facility] [place], [facility]  Director, [place] [address]    [phone] ext [phone] (office)  [phone] fax  Chief Complaint   Patient presents with    Nephrolithiasis             HISTORY OF PRESENT ILLNESS:  Elijah Perry is a 81 y.o. male who presents today in hospital follow up for right ureteral stone. Patient also has a known diagnosis of prostate cancer on active surveillance since 2015.      Patient presented to the ED on 05/13/22 with c/o abdominal pain, constipation, nausea, vomiting, diarrhea. UA at the time was notable for negative RBC's, WBC's, and negative nitrites. Cr 1.6 mg/dL. WBC count 12.5 K/uL. CT A/P performed at the time showed asymmetric moderate hydronephrosis of the right kidney with delayed nephrographic enhancement. There is hydroureter to the level of the pelvis where there is an obstructing 5 mm calculus lying approximately 2 cm above the right UVJ. UCx on 05/13/22 showed 10,000 col/mL, more than  2 different organisms.  Culture appears contaminated with skin flora.     Patient denies any fevers or chills since he was discharged from the hospital. Denies any flank pain at this time. Denies any gross hematuria. No new voiding complaints at this time.     Patient thinks he may have passed his stone in the interim as he as not had any further flank pain.     Patient reports that he had low volume prostate cancer.      No past medical history on file.    No past surgical history on file.    Social History     Tobacco Use    Smoking status: Former     Packs/day: 0.50     Years: 3.00     Additional pack years: 0.00     Total pack years: 1.50     Types: Cigarettes    Smokeless tobacco: Never   Substance Use Topics    Alcohol use: Not Currently    Drug use: Never       Allergies   Allergen Reactions    Adhesive Tape Hives, Itching and Rash     Other reaction(s): rash/itching    Other Diarrhea, Hives, Itching, Nausea And Vomiting and Swelling     Other reaction(s): Dermatitis       No family history on file.    Current Outpatient Medications   Medication Sig Dispense Refill    docusate sodium (COLACE) 100 MG capsule Take 1 capsule by mouth daily      BREO ELLIPTA 200-25 MCG/ACT AEPB inhaler INHALE 1 PUFF BY MOUTH DAILY AS NEEDED      levothyroxine (SYNTHROID) 50 MCG tablet       tamsulosin (FLOMAX) 0.4 MG capsule TAKE 1 CAPSULE BY MOUTH EVERY NIGHT AT BEDTIME      rosuvastatin (CRESTOR) 40 MG tablet       pyridostigmine (MESTINON) 60 MG tablet Take 1 tablet by mouth 3 times daily      oxyCODONE (ROXICODONE) 5 MG immediate release tablet TAKE 1 TABLET BY MOUTH EVERY 6 HOURS AS NEEDED FOR SEVERE PAIN OR MODERATE PAIN      ondansetron (ZOFRAN-ODT) 4 MG disintegrating tablet DISSOLVE 1 TABLET ON THE TONGUE EVERY 8 HOURS AS NEEDED      cephALEXin (KEFLEX) 500 MG capsule TAKE 1 CAPSULE BY MOUTH EVERY 12 HOURS FOR 7 DAYS      vitamin D (ERGOCALCIFEROL) 1.25 MG (50000 UT) CAPS capsule Take 1 capsule by mouth every 7 days       acetaminophen (TYLENOL) 500 MG tablet Take 2 tablets by mouth  every 8 hours as needed       No current facility-administered medications for this visit.         PHYSICAL EXAMINATION:   Vitals:    05/21/22 1144   Weight: 107.5 kg (237 lb)   Height: 1.854 m ('6\' 1"'$ )     Constitutional: WDWN, Pleasant and appropriate affect, No acute distress.    CV:  No peripheral swelling noted  Respiratory: No respiratory distress or difficulties  Abdomen:  No abdominal masses or tenderness.  No CVA tenderness. No hernias noted.   GU Male: No CVA tenderness.   Skin: No jaundice.    Neuro/Psych:  Alert and oriented x 3. Affect appropriate.   Lymphatic:   No enlarged supraclavicular lymph nodes.            REVIEW OF LABS AND IMAGING:    Results for orders placed or performed in visit on 05/21/22   AMB POC URINALYSIS DIP STICK AUTO W/O MICRO   Result Value Ref Range    Color, Urine, POC Yellow     Clarity, Urine, POC Clear     Glucose, Urine, POC Negative Negative    Bilirubin, Urine, POC Negative Negative    Ketones, Urine, POC Negative Negative    Specific Gravity, Urine, POC 1.025 1.001 - 1.035    Blood, Urine, POC 1+ Negative    pH, Urine, POC 5.5 4.6 - 8.0    Protein, Urine, POC Negative Negative    Urobilinogen, POC Normal     Nitrite, Urine, POC Negative Negative    Leukocyte Esterase, Urine, POC Trace Negative            PSA Trend (ng/mL)  05/16/2021 5.25  05/02/2020 2.84  11/01/2019 2.66  02/25/2019 2.18  01/18/2019 1.86  07/13/2018 2.58  11/26/2017 3.05  05/08/2017 3.70      CT A/P w cont 05/13/22  IMPRESSION   1. There is a 5 mm obstructing stone at the distal right ureter (approximately 2 cm from the right UVJ) with associated moderate hydronephrosis and hydroureter. The delayed nephrographic enhancement of the right kidney indicate a high-grade obstruction. There are no residual stones in either kidney.    2. The pattern of prominent fluid and dilatation in the distal small bowel may represent an associated adynamic ileus  pattern.    3. Cholelithiasis without acute cholecystitis.    CTU 07/26/19  IMPRESSION   1. Obstructing stone in the proximal to mid left ureter measuring 5 x 4 x 6 mm, with associated hypoperfusion of the left kidney, perinephric and periureteric stranding, and proximal urothelial thickening and hyperenhancement. Superimposed upper urinary tract infection is not excluded. Correlate clinically.   2. Bilateral punctate nonobstructing intrarenal calculi as described above.   3. Benign simple cyst in the posterior left mid kidney. No solid masses are noted.   4. Prostatic enlargement.   5. Decompressed, thick-walled urinary bladder. Question chronic bladder outlet obstruction.   6. Sigmoid diverticulosis with no acute diverticulitis. Normal appendix. Hepatic steatosis. Cholelithiasis with no acute cholecystitis.    Addendum Begins   Addendum report by:  Janean Sark, MD  on  06/05/2020.   Original report by: Janean Sark, MD  on  July 26, 2019.     MRI Prostate w/wo cont 01/29/19  IMPRESSION   1. Enlarged prostate with findings consistent with benign prostate hyperplasia.   2. No focal lesion identified meeting criteria for suspicion per PI-RADS. Of note Gleason 6 disease can be occult on imaging.  No follow-up is required for the benign simple cyst in the posterior left mid kidney.     Juanda Bond. Fatima Sanger MD FACS  Professor of Urology   [facility]  [facility] of [place], [facility]  Director, [place] [address]    [phone] ext [phone] (office)  [phone] fax      Medical documentation is provided with the assistance of Deeann Cree, medical scribe for Adrian Saran on 05/21/2022

## 2022-05-21 NOTE — Telephone Encounter (Signed)
Elijah Perry has an order for CT      ALL MALE PATIENTS NEEDING AN MRI OF PELVIS OR PROSTATE, MUST BE SCHEDULED AT ONE OF THE FOLLOWING:    **MRI/CT (Preferred [facility])  **[facility] [place] (Preferred [facility])  **[facility] (Preferred [facility])  [facility]  [place]      To be done at [facility]    Needed by:  Stat    Patient has a follow-up appointment:  Yes     If MRI, does patient have a pacemaker:   No    Order has been placed in connect care:  Yes    Is this a STAT order:  Yes

## 2022-05-22 NOTE — Addendum Note (Signed)
Addended by: Posey Pronto D on: 05/22/2022 03:58 PM     Modules accepted: Level of Service

## 2022-05-22 NOTE — Telephone Encounter (Signed)
Faxed imaging to [place]; [facility] [phone];  CT STAT   PATIENT MAY CALL [phone] TO SCHEDULE

## 2022-06-05 ENCOUNTER — Encounter

## 2022-06-09 NOTE — Other (Signed)
Patient needs appt this week to review and discuss possible treatment    Elijah Perry. Fatima Sanger MD FACS  Professor of Urology   [facility]  [facility] [place], [facility]  Director, [place] [address]    [phone] ext 2 (office)  [phone] fax

## 2022-06-09 NOTE — Other (Signed)
Needs appt this week to review and discuss options

## 2022-06-10 NOTE — Telephone Encounter (Signed)
Called patient and scheduled him a follow up with Dr. Fatima Sanger on 06/18/2022

## 2022-06-14 ENCOUNTER — Ambulatory Visit
Admit: 2022-06-14 | Payer: MEDICARE | Attending: Student in an Organized Health Care Education/Training Program | Primary: Student in an Organized Health Care Education/Training Program

## 2022-06-14 ENCOUNTER — Encounter

## 2022-06-14 DIAGNOSIS — Z0000 Encounter for general adult medical examination without abnormal findings: Secondary | ICD-10-CM

## 2022-06-14 DIAGNOSIS — J069 Acute upper respiratory infection, unspecified: Secondary | ICD-10-CM

## 2022-06-14 NOTE — Progress Notes (Signed)
Chief Complaint   Patient presents with    Nausea    Headache     1. Have you been to the ER, urgent care clinic since your last visit?  Hospitalized since your last visit?No    2. Have you seen or consulted any other health care providers outside of the [facility] since your last visit?  Include any pap smears or colon screening. No

## 2022-06-14 NOTE — Progress Notes (Signed)
Subjective:      Patient ID: Elijah Perry is a 81 y.o. male.    Pleasant 81 year old gentleman with a past medical history of T2DM, acquired hypothyroidism, HLD and history of prostate CA who is presenting today to establish care, discuss his chronic medical issues and discuss recent viral URI with cough.  Patient reports much improvement.  Patient also states he recently relocated from [place].  Patient also reports concern about intermittent/occasional incontinence to feces.  Patient at this time denies any acute medical issues        Review of Systems   Constitutional:  Negative for activity change, appetite change, fatigue and fever.   HENT:  Negative for congestion, hearing loss, postnasal drip, rhinorrhea, sore throat, trouble swallowing and voice change.    Eyes:  Negative for photophobia, discharge, itching and visual disturbance.   Respiratory:  Negative for cough, shortness of breath and wheezing.    Cardiovascular:  Negative for chest pain, palpitations and leg swelling.   Gastrointestinal:  Negative for abdominal pain, constipation, diarrhea, nausea and vomiting.   Genitourinary:  Negative for difficulty urinating and dysuria.   Musculoskeletal:  Negative for arthralgias and back pain.   Skin:  Negative for rash.   Neurological:  Negative for dizziness, weakness, light-headedness and headaches.   Psychiatric/Behavioral:  Negative for sleep disturbance. The patient is not nervous/anxious.        Objective: BP 139/73 (Site: Left Upper Arm, Position: Sitting, Cuff Size: Medium Adult)   Pulse 80   Temp 97.1 F (36.2 C) (Temporal)   Resp 20   Ht 1.854 m ('6\' 1"'$ )   Wt 104.8 kg (231 lb)   SpO2 98%   BMI 30.48 kg/m      Physical Exam  Vitals reviewed.   Constitutional:       General: He is not in acute distress.     Appearance: Normal appearance. He is obese. He is not ill-appearing, toxic-appearing or diaphoretic.   HENT:      Head: Normocephalic and atraumatic.      Right Ear: External ear normal.       Left Ear: External ear normal.   Eyes:      General:         Right eye: No discharge.         Left eye: No discharge.   Cardiovascular:      Rate and Rhythm: Normal rate and regular rhythm.      Pulses: Normal pulses.      Heart sounds: Normal heart sounds. No murmur heard.     No friction rub. No gallop.   Pulmonary:      Effort: Pulmonary effort is normal.      Breath sounds: Normal breath sounds. No wheezing, rhonchi or rales.   Musculoskeletal:         General: Normal range of motion.      Cervical back: Normal range of motion.   Skin:     General: Skin is warm.   Neurological:      Mental Status: He is alert and oriented to person, place, and time. Mental status is at baseline.   Psychiatric:         Mood and Affect: Mood normal.         Behavior: Behavior normal.         Assessment / Plan:   Elijah Perry was seen today for nausea and headache.    Diagnoses and all orders for this visit:  Viral URI with cough  Comments:  Much improved.  We will continue to monitor.    Need for hepatitis C screening test  Comments:  Per care gap  Orders:  -     Hepatitis C Antibody; Future    Screening PSA (prostate specific antigen)  Comments:  Per care gap  Orders:  -     PSA Screening; Future    Well adult exam  Comments:  Upcoming in 3 months  Orders:  -     Comprehensive Metabolic Panel; Future    Acquired hypothyroidism  -     TSH + Free T4 Panel; Future    Hyperlipidemia, unspecified hyperlipidemia type  Comments:  Per care gap  Orders:  -     Lipid Panel; Future    Diabetes 1.5, managed as type 2 ([place])  Comments:  Per care gap  Orders:  -     Hemoglobin A1C; Future      Return in about 3 months (around 09/13/2022) for Well visit.    This note was dictated utilizing voice recognition software which may lead to typographical errors.  I apologize in advance if the situation occurs.  If questions arise please do not hesitate to contact me or call our department.

## 2022-06-18 ENCOUNTER — Encounter: Attending: Urology | Primary: Student in an Organized Health Care Education/Training Program

## 2022-06-18 NOTE — Progress Notes (Unsigned)
Elijah Perry  DOB: [birth year]   Encounter Date: 06/18/2022     ASSESSMENT:  No diagnosis found.         - Nephrolithiasis    Surgical History:    S/p cystoscopy, left ureteroscopy, and left ureteral stent placement on 08/03/2019. Patient's left ureteral stone was unable to be addressed due to relative stenosis of the left ureter   S/p cysto, left URS, basket stone extraction, left RPG, left ureteral stent exchange on 08/17/19 with Dr. Freida Busman.      Medical Therapy: N/A   Stone Composition: 08/2019 (100% Uric Acid).    24 Hr Urine Panel: N/A     Imaging:    CTU 07/26/19:  The left kidney demonstrates pelvocaliectasis and proximal ureterectasis extending to the mid ureter where there is an 5.7 mm obstructing stone.  CT A/P 05/13/22: Asymmetric moderate hydronephrosis of the right kidney with delayed nephrographic enhancement. There is hydroureter to the level of the pelvis where there is an obstructing 5 mm calculus lying approximately 2 cm above the right UVJ.  CT Abd Pelv 06/02/22: No evidence of nephrolithiasis, hydronephrosis, or other signs of obstructive nephropathy. Small calculus within the urinary bladder, presumably passed stone.      - Prostate Cancer, GS 3+3 in 1/12 cores per previous Urologic records diagnosed s/p TRUS biopsy on 09/28/13. Pathology report not on file.     On active surveillance.    S/p TRUS biopsy 01/19/16. Pathology report not on file.    MRI Prostate 01/29/19: There are no suspicious lesions in the prostate. The prostate transition zone is enlarged with benign prostatic hyperplasia. Prostate volume: 79.29 mL    Most recent PSA was 5.25 ng/mL on 05/16/21.     - BPH with LUTS. Taking Flomax and Oxybutynin     - PMH: Myasthenia Gravis.     PLAN:    Reviewed CT Abd Pelv from 06/02/22: No evidence of nephrolithiasis, hydronephrosis, or other signs of obstructive nephropathy. Small calculus within the urinary bladder, presumably passed stone.       Old Plan***  Reviewed and interpreted CT A/P  05/13/22: Asymmetric moderate hydronephrosis of the right kidney with delayed nephrographic enhancement. There is hydroureter to the level of the pelvis where there is an obstructing 5 mm calculus lying approximately 2 cm above the right UVJ.  Recommended CT A/P wo cont to reevaluate for right ureteral stone. R/B reviewed.   Will plan for MRI Prostate w/wo cont and PSA for surveillance of patient's prostate cancer once patient's acute stone episode has resolved.   STAT CT A/P wo cont ordered today to be done now.   Will see if patient can be arranged for KUB today.   Follow up in 1 week with extender to review CT A/P wo cont and KUB. DRE at this time.   I discussed the fact that we do assure that he is cleared stone given the fact that he has a ureteral stricture.  Complains of back present times and culture recommending a CT scan and follow-up about 1  Hydronephrosis and evidence of stones above procedure we discussed guidelines for ureteral stent today.      Veverly Fells. Silvestre Moment MD FACS  Professor of Urology   [facility]  [facility] of [place], [facility]  Director, Endourology Fellowship  [address]  [place], [place] [address]    [phone] ext [phone] (office)  [phone] fax  No chief complaint on file.            HISTORY OF PRESENT ILLNESS:  Elijah Perry is a 81 y.o. male who presents today in hospital follow up for right ureteral stone. Patient also has a known diagnosis of prostate cancer on active surveillance since 2015.      Patient presented to the ED on 05/13/22 with c/o abdominal pain, constipation, nausea, vomiting, diarrhea. UA at the time was notable for negative RBC's, WBC's, and negative nitrites. Cr 1.6 mg/dL. WBC count 12.5 K/uL. CT A/P performed at the time showed asymmetric moderate hydronephrosis of the right kidney with delayed nephrographic enhancement. There is hydroureter to the level of the pelvis where there is an obstructing 5 mm calculus lying approximately  2 cm above the right UVJ. UCx on 05/13/22 showed 10,000 col/mL, more than 2 different organisms.  Culture appears contaminated with skin flora.     Patient denies any fevers or chills since he was discharged from the hospital. Denies any flank pain at this time. Denies any gross hematuria. No new voiding complaints at this time.     Patient thinks he may have passed his stone in the interim as he as not had any further flank pain.     Patient reports that he had low volume prostate cancer.      No past medical history on file.    No past surgical history on file.    Social History     Tobacco Use    Smoking status: Former     Packs/day: 0.50     Years: 3.00     Additional pack years: 0.00     Total pack years: 1.50     Types: Cigarettes    Smokeless tobacco: Never   Substance Use Topics    Alcohol use: Not Currently    Drug use: Never       Allergies   Allergen Reactions    Adhesive Tape Hives, Itching and Rash     Other reaction(s): rash/itching    Other Diarrhea, Hives, Itching, Nausea And Vomiting and Swelling     Other reaction(s): Dermatitis       No family history on file.    Current Outpatient Medications   Medication Sig Dispense Refill    docusate sodium (COLACE) 100 MG capsule Take 1 capsule by mouth daily      BREO ELLIPTA 200-25 MCG/ACT AEPB inhaler INHALE 1 PUFF BY MOUTH DAILY AS NEEDED      levothyroxine (SYNTHROID) 50 MCG tablet       tamsulosin (FLOMAX) 0.4 MG capsule TAKE 1 CAPSULE BY MOUTH EVERY NIGHT AT BEDTIME      rosuvastatin (CRESTOR) 40 MG tablet       pyridostigmine (MESTINON) 60 MG tablet Take 1 tablet by mouth 3 times daily      oxyCODONE (ROXICODONE) 5 MG immediate release tablet TAKE 1 TABLET BY MOUTH EVERY 6 HOURS AS NEEDED FOR SEVERE PAIN OR MODERATE PAIN      ondansetron (ZOFRAN-ODT) 4 MG disintegrating tablet DISSOLVE 1 TABLET ON THE TONGUE EVERY 8 HOURS AS NEEDED      cephALEXin (KEFLEX) 500 MG capsule TAKE 1 CAPSULE BY MOUTH EVERY 12 HOURS FOR 7 DAYS      vitamin D (ERGOCALCIFEROL)  1.25 MG (50000 UT) CAPS capsule Take 1 capsule by mouth every 7 days      acetaminophen (TYLENOL) 500 MG tablet Take 2 tablets by mouth every 8 hours as needed  No current facility-administered medications for this visit.         PHYSICAL EXAMINATION:   There were no vitals filed for this visit.    Constitutional: WDWN, Pleasant and appropriate affect, No acute distress.    CV:  No peripheral swelling noted  Respiratory: No respiratory distress or difficulties  Abdomen:  No abdominal masses or tenderness.  No CVA tenderness. No hernias noted.   GU Male: No CVA tenderness.   Skin: No jaundice.    Neuro/Psych:  Alert and oriented x 3. Affect appropriate.   Lymphatic:   No enlarged supraclavicular lymph nodes.            REVIEW OF LABS AND IMAGING:    No results found for this visit on 06/18/22.           PSA Trend (ng/mL)  05/16/2021 5.25  05/02/2020 2.84  11/01/2019 2.66  02/25/2019 2.18  01/18/2019 1.86  07/13/2018 2.58  11/26/2017 3.05  05/08/2017 3.70    CT Abd Pelv WO Cont 06/02/22  KIDNEYS: Normal. There is no nephrolithiasis. There is no hydronephrosis, hydroureter, or other signs of obstructive nephropathy. Small calculus within the urinary bladder.     IMPRESSION     1. No evidence of nephrolithiasis, hydronephrosis, or other signs of obstructive nephropathy.   2. Small calculus within the urinary bladder, presumably passed stone.   3. Diverticulosis coli without acute diverticulitis.   4. Cholelithiasis without CT evidence of acute cholecystitis.       CT A/P w cont 05/13/22  IMPRESSION   1. There is a 5 mm obstructing stone at the distal right ureter (approximately 2 cm from the right UVJ) with associated moderate hydronephrosis and hydroureter. The delayed nephrographic enhancement of the right kidney indicate a high-grade obstruction. There are no residual stones in either kidney.    2. The pattern of prominent fluid and dilatation in the distal small bowel may represent an associated adynamic ileus  pattern.    3. Cholelithiasis without acute cholecystitis.    CTU 07/26/19  IMPRESSION   1. Obstructing stone in the proximal to mid left ureter measuring 5 x 4 x 6 mm, with associated hypoperfusion of the left kidney, perinephric and periureteric stranding, and proximal urothelial thickening and hyperenhancement. Superimposed upper urinary tract infection is not excluded. Correlate clinically.   2. Bilateral punctate nonobstructing intrarenal calculi as described above.   3. Benign simple cyst in the posterior left mid kidney. No solid masses are noted.   4. Prostatic enlargement.   5. Decompressed, thick-walled urinary bladder. Question chronic bladder outlet obstruction.   6. Sigmoid diverticulosis with no acute diverticulitis. Normal appendix. Hepatic steatosis. Cholelithiasis with no acute cholecystitis.    Addendum Begins   Addendum report by:  Albertine Patricia, MD  on  06/05/2020.   Original report by: Albertine Patricia, MD  on  July 26, 2019.     MRI Prostate w/wo cont 01/29/19  IMPRESSION   1. Enlarged prostate with findings consistent with benign prostate hyperplasia.   2. No focal lesion identified meeting criteria for suspicion per PI-RADS. Of note Gleason 6 disease can be occult on imaging.    No follow-up is required for the benign simple cyst in the posterior left mid kidney.     Veverly Fells. Fabrizio MD FACS  Professor of Urology   [facility]  Urology of [place], [facility]  Director, Endourology Fellowship  [address]  [place], [place] [address]    [phone]  [phone] ext [phone] (office)  [phone] fax    Documentation provided with the assistance of Emi Belfast, medical scribe for [employer]

## 2022-06-19 NOTE — Telephone Encounter (Signed)
Called  patient to reschedule appt no show 06/18/2022 with Fabrizio, lm on vm to return call

## 2022-07-10 ENCOUNTER — Ambulatory Visit
Admit: 2022-07-10 | Discharge: 2022-07-10 | Payer: MEDICARE | Attending: Urology | Primary: Student in an Organized Health Care Education/Training Program

## 2022-07-10 DIAGNOSIS — N200 Calculus of kidney: Secondary | ICD-10-CM

## 2022-07-10 DIAGNOSIS — C61 Malignant neoplasm of prostate: Secondary | ICD-10-CM

## 2022-07-10 NOTE — Progress Notes (Signed)
Elijah Perry  DOB: 03/16/1941   Encounter Date: 07/10/2022     ASSESSMENT:    ICD-10-CM    1. Malignant neoplasm of prostate ([place])  C61 MRI PROSTATE W WO CONTRAST      2. Nephrolithiasis  N20.0                - Nephrolithiasis    Surgical History:    S/p cystoscopy, left ureteroscopy, and left ureteral stent placement on 08/03/2019. Patient's left ureteral stone was unable to be addressed due to relative stenosis of the left ureter   S/p cysto, left URS, basket stone extraction, left RPG, left ureteral stent exchange on 08/17/19 with Dr. Zenia Resides.      Medical Therapy: N/A   Stone Composition: 08/2019 (100% Uric Acid).    24 Hr Urine Panel: N/A     Imaging:    CTU 07/26/19:  The left kidney demonstrates pelvocaliectasis and proximal ureterectasis extending to the mid ureter where there is an 5.7 mm obstructing stone.  CT A/P 05/13/22: Asymmetric moderate hydronephrosis of the right kidney with delayed nephrographic enhancement. There is hydroureter to the level of the pelvis where there is an obstructing 5 mm calculus lying approximately 2 cm above the right UVJ.  CT A/P 06/02/22: There is no nephrolithiasis. There is no hydronephrosis, hydroureter, or other signs of obstructive nephropathy. Small calculus within the urinary bladder.      - Prostate Cancer, GS 3+3 in 1/12 cores per previous Urologic records diagnosed s/p TRUS biopsy on 09/28/13. Pathology report not on file.     On active surveillance.    S/p TRUS biopsy 01/19/16. Pathology report not on file.    MRI Prostate 01/29/19: There are no suspicious lesions in the prostate. The prostate transition zone is enlarged with benign prostatic hyperplasia. Prostate volume: 79.29 mL    Patient deferred DRE 07/10/22    Most recent PSA was 5.25 ng/mL on 05/16/21.     - BPH with LUTS. Taking Flomax and Oxybutynin     - PMH: Myasthenia Gravis.     PLAN:    Reviewed and interpreted CT A/P 06/02/22: There is no nephrolithiasis. There is no hydronephrosis, hydroureter, or other  signs of obstructive nephropathy. Small calculus within the urinary bladder.    Patient deferred DRE today.   Recommended MRI Prostate and repeat biopsy for active surveillance of prostate cancer. R/B reviewed.   Patient would like to have prostate biopsy under anesthesia. Has requested  Continue Flomax 0.4 mg daily.   Continue active surveillance.   MRI Prostate w/wo cont ordered today to be done within 2 months.   Follow up in 2 months with extender to review MRI Prostate and schedule for TRUS vs MRI fusion biopsy under anesthesia at this time.      Juanda Bond. Fatima Sanger MD FACS  Professor of Urology   [facility]  Urology of [place], [facility]  Director, [place] [address]    [phone] ext 2 (office)  [phone] fax           No chief complaint on file.            HISTORY OF PRESENT ILLNESS:  Elijah Perry is a 81 y.o. male who presents today in follow up for nephrolithiasis. Patient also has a known diagnosis of prostate cancer on active surveillance since 2015.      Patient reports that he would prefer to have next prostate biopsy  under anesthesia.   Patient reports nocturia 2-3x.          Past Medical History:   Diagnosis Date    Myasthenia gravis ([place])     Prostate cancer ([place])        Past Surgical History:   Procedure Laterality Date    JOINT REPLACEMENT      TOTAL HIP ARTHROPLASTY         Social History     Tobacco Use    Smoking status: Former     Current packs/day: 0.50     Average packs/day: 0.5 packs/day for 3.0 years (1.5 ttl pk-yrs)     Types: Cigarettes     Passive exposure: Never    Smokeless tobacco: Never   Substance Use Topics    Alcohol use: Not Currently    Drug use: Never       Allergies   Allergen Reactions    Adhesive Tape Hives, Itching and Rash     Other reaction(s): rash/itching    Other Hives, Diarrhea, Itching, Nausea And Vomiting and Swelling     Other reaction(s): Dermatitis       No family history on file.    Current  Outpatient Medications   Medication Sig Dispense Refill    docusate sodium (COLACE) 100 MG capsule Take 1 capsule by mouth daily      BREO ELLIPTA 200-25 MCG/ACT AEPB inhaler INHALE 1 PUFF BY MOUTH DAILY AS NEEDED      levothyroxine (SYNTHROID) 50 MCG tablet  (Patient not taking: Reported on 06/14/2022)      tamsulosin (FLOMAX) 0.4 MG capsule TAKE 1 CAPSULE BY MOUTH EVERY NIGHT AT BEDTIME      rosuvastatin (CRESTOR) 40 MG tablet       pyridostigmine (MESTINON) 60 MG tablet Take 1 tablet by mouth 3 times daily      cephALEXin (KEFLEX) 500 MG capsule TAKE 1 CAPSULE BY MOUTH EVERY 12 HOURS FOR 7 DAYS      vitamin D (ERGOCALCIFEROL) 1.25 MG (50000 UT) CAPS capsule Take 1 capsule by mouth every 7 days      acetaminophen (TYLENOL) 500 MG tablet Take 2 tablets by mouth every 8 hours as needed       No current facility-administered medications for this visit.         PHYSICAL EXAMINATION:   There were no vitals filed for this visit.    Constitutional: WDWN, Pleasant and appropriate affect, No acute distress.    CV:  No peripheral swelling noted  Respiratory: No respiratory distress or difficulties  Abdomen:  No abdominal masses or tenderness.  No CVA tenderness. No hernias noted.   GU Male: patient deferred DRE today.   Skin: No jaundice.    Neuro/Psych:  Alert and oriented x 3. Affect appropriate.   Lymphatic:   No enlarged supraclavicular lymph nodes.            REVIEW OF LABS AND IMAGING:    No results found for this visit on 07/10/22.           PSA Trend (ng/mL)  05/16/2021 5.25  05/02/2020 2.84  11/01/2019 2.66  02/25/2019 2.18  01/18/2019 1.86  07/13/2018 2.58  11/26/2017 3.05  05/08/2017 3.70    CT A/P wo cont 06/02/22  IMPRESSION   1. No evidence of nephrolithiasis, hydronephrosis, or other signs of obstructive nephropathy.   2. Small calculus within the urinary bladder, presumably passed stone.   3. Diverticulosis coli without acute diverticulitis.   4. Cholelithiasis  without CT evidence of acute cholecystitis.     CT A/P  w cont 05/13/22  IMPRESSION   1. There is a 5 mm obstructing stone at the distal right ureter (approximately 2 cm from the right UVJ) with associated moderate hydronephrosis and hydroureter. The delayed nephrographic enhancement of the right kidney indicate a high-grade obstruction. There are no residual stones in either kidney.    2. The pattern of prominent fluid and dilatation in the distal small bowel may represent an associated adynamic ileus pattern.    3. Cholelithiasis without acute cholecystitis.    CTU 07/26/19  IMPRESSION   1. Obstructing stone in the proximal to mid left ureter measuring 5 x 4 x 6 mm, with associated hypoperfusion of the left kidney, perinephric and periureteric stranding, and proximal urothelial thickening and hyperenhancement. Superimposed upper urinary tract infection is not excluded. Correlate clinically.   2. Bilateral punctate nonobstructing intrarenal calculi as described above.   3. Benign simple cyst in the posterior left mid kidney. No solid masses are noted.   4. Prostatic enlargement.   5. Decompressed, thick-walled urinary bladder. Question chronic bladder outlet obstruction.   6. Sigmoid diverticulosis with no acute diverticulitis. Normal appendix. Hepatic steatosis. Cholelithiasis with no acute cholecystitis.    Addendum Begins   Addendum report by:  Janean Sark, MD  on  06/05/2020.   Original report by: Janean Sark, MD  on  July 26, 2019.     MRI Prostate w/wo cont 01/29/19  IMPRESSION   1. Enlarged prostate with findings consistent with benign prostate hyperplasia.   2. No focal lesion identified meeting criteria for suspicion per PI-RADS. Of note Gleason 6 disease can be occult on imaging.    No follow-up is required for the benign simple cyst in the posterior left mid kidney.     Juanda Bond. Fatima Sanger MD FACS  Professor of Urology   [facility]  Urology of [place], [facility]  Director, [place]  [address]    [phone] ext [phone] (office)  [phone] fax      Medical documentation is provided with the assistance of Deeann Cree, medical scribe for Adrian Saran on 07/10/2022

## 2022-07-10 NOTE — Progress Notes (Signed)
Elijah Perry has an order for MRI Prostate         To be done at [facility]     Needed by: Now     Patient has a follow-up appointment:Yes 08/14/2022    If MRI, does patient have a pacemaker:   No    Order has been placed in connect care:   Yes    Is this a STAT order:  No    Crista Luria, MA

## 2022-07-11 NOTE — Progress Notes (Signed)
Patient removed from [facility] per Dr. Nicholes Calamity note indicating no stone.

## 2022-07-17 NOTE — Progress Notes (Signed)
Faxed imaging to [facility]; [facility] [phone]; MRI ; NOW   (Please allow at least 72 hours for processing) PATIENT MAY CALL [phone] TO SCHEDULE

## 2022-08-12 ENCOUNTER — Encounter

## 2022-08-14 ENCOUNTER — Ambulatory Visit
Admit: 2022-08-14 | Discharge: 2022-08-14 | Payer: MEDICARE | Attending: Physician Assistant | Primary: Student in an Organized Health Care Education/Training Program

## 2022-08-14 DIAGNOSIS — C61 Malignant neoplasm of prostate: Secondary | ICD-10-CM

## 2022-08-14 NOTE — Progress Notes (Signed)
Dr. Claiborne Billings is the supervising physician today    Elijah Perry  Perry: [birth year]   Encounter Date: 08/14/2022     ASSESSMENT:    ICD-10-CM    1. Malignant neoplasm of prostate ([place])  C61              - Prostate Cancer, GS 3+3 in 1/12 cores per previous urologic records diagnosed s/p TRUS biopsy on 09/28/13. Pathology report not on file.     On active surveillance.    S/p TRUS biopsy 01/19/16. Pathology report not on file.    MRI Prostate 01/29/19: There are no suspicious lesions in the prostate. The prostate transition zone is enlarged with benign prostatic hyperplasia. Prostate volume: 79.29 mL    Patient deferred DRE 07/10/22    Most recent PSA was 5.25 ng/mL on 05/16/21.    MRI of pelvis 08/07/22: Prostate volume 110gms, PIRAD 2, limited exam due to artifact    - Nephrolithiasis    Surgical History:    S/p cystoscopy, left ureteroscopy, and left ureteral stent placement on 08/03/2019. Patient's left ureteral stone was unable to be addressed due to relative stenosis of the left ureter   S/p cysto, left URS, basket stone extraction, left RPG, left ureteral stent exchange on 08/17/19 with Dr. Zenia Resides.      Medical Therapy: N/A   Stone Composition: 08/2019 (100% Uric Acid).    24 Hr Urine Panel: N/A     Imaging:    CTU 07/26/19:  The left kidney demonstrates pelvocaliectasis and proximal ureterectasis extending to the mid ureter where there is an 5.7 mm obstructing stone.  CT A/P 05/13/22: Asymmetric moderate hydronephrosis of the right kidney with delayed nephrographic enhancement. There is hydroureter to the level of the pelvis where there is an obstructing 5 mm calculus lying approximately 2 cm above the right UVJ.  CT A/P 06/02/22: There is no nephrolithiasis. There is no hydronephrosis, hydroureter, or other signs of obstructive nephropathy. Small calculus within the urinary bladder.      - BPH with LUTS. Taking Flomax    - PMH: Myasthenia Gravis.     PLAN:    MRI reviewed, PIRAD 2 score.   Due for surveillance biopsy.  Patient would like to have prostate biopsy under anesthesia and will need medical clearance.  He is still trying to find a PCP and neurologist for his MG.  Therefore, I recommend holding off on biopsy for now. He meets with [facility] on the 17th to discuss PCP  PSA drawn today since he has not had a PSA since 05/2021.  Continue Flomax 0.4 mg daily.   PVR 0cc  Follow up 6 mos with Dr Given with PSA prior to establish care and discuss plans for biopsy.           Chief Complaint   Patient presents with    Prostate Cancer             HISTORY OF PRESENT ILLNESS:  Elijah Perry is a 82 y.o. male who presents today in follow up for his prostate cancer, currently on active surveillance since 2015.Marland Kitchen Here to review MRI which was limited due to artifact.  Noted to have a 110gm prostate, PIRAD 2.      Patient reports that he would prefer to have next prostate biopsy under anesthesia.     Stable voiding. Nocturia constantly. UUI.  He drinks fluids day and night. He has to take meds at night and requires fluids for this.  Continues on Flomax.  Past Medical History:   Diagnosis Date    Myasthenia gravis ([place])     Prostate cancer ([place])        Past Surgical History:   Procedure Laterality Date    JOINT REPLACEMENT      TOTAL HIP ARTHROPLASTY         Social History     Tobacco Use    Smoking status: Former     Current packs/day: 0.50     Average packs/day: 0.5 packs/day for 3.0 years (1.5 ttl pk-yrs)     Types: Cigarettes     Passive exposure: Never    Smokeless tobacco: Never   Substance Use Topics    Alcohol use: Not Currently    Drug use: Never       Allergies   Allergen Reactions    Adhesive Tape Hives, Itching and Rash     Other reaction(s): rash/itching    Other Hives, Diarrhea, Itching, Nausea And Vomiting and Swelling     Other reaction(s): Dermatitis       No family history on file.    Current Outpatient Medications   Medication Sig Dispense Refill    docusate sodium (COLACE) 100 MG capsule Take 1 capsule by mouth daily       BREO ELLIPTA 200-25 MCG/ACT AEPB inhaler INHALE 1 PUFF BY MOUTH DAILY AS NEEDED      levothyroxine (SYNTHROID) 50 MCG tablet  (Patient not taking: Reported on 06/14/2022)      tamsulosin (FLOMAX) 0.4 MG capsule TAKE 1 CAPSULE BY MOUTH EVERY NIGHT AT BEDTIME      rosuvastatin (CRESTOR) 40 MG tablet       pyridostigmine (MESTINON) 60 MG tablet Take 1 tablet by mouth 3 times daily      vitamin D (ERGOCALCIFEROL) 1.25 MG (50000 UT) CAPS capsule Take 1 capsule by mouth every 7 days      acetaminophen (TYLENOL) 500 MG tablet Take 2 tablets by mouth every 8 hours as needed       No current facility-administered medications for this visit.         PHYSICAL EXAMINATION:   There were no vitals filed for this visit.    Constitutional: WDWN, Pleasant and appropriate affect, No acute distress.    CV:  No peripheral swelling noted  Respiratory: No respiratory distress or difficulties  Abdomen:  ND  Skin: No jaundice.    Neuro/Psych:  Alert and oriented x 3. Affect appropriate.           REVIEW OF LABS AND IMAGING:    No results found for this visit on 08/14/22.           PSA Trend (ng/mL)  05/16/2021 5.25  05/02/2020 2.84  11/01/2019 2.66  02/25/2019 2.18  01/18/2019 1.86  07/13/2018 2.58  11/26/2017 3.05  05/08/2017 3.70    CT A/P wo cont 06/02/22  IMPRESSION   1. No evidence of nephrolithiasis, hydronephrosis, or other signs of obstructive nephropathy.   2. Small calculus within the urinary bladder, presumably passed stone.   3. Diverticulosis coli without acute diverticulitis.   4. Cholelithiasis without CT evidence of acute cholecystitis.     CT A/P w cont 05/13/22  IMPRESSION   1. There is a 5 mm obstructing stone at the distal right ureter (approximately 2 cm from the right UVJ) with associated moderate hydronephrosis and hydroureter. The delayed nephrographic enhancement of the right kidney indicate a high-grade obstruction. There are no residual stones in either kidney.    2.  The pattern of prominent fluid and dilatation in  the distal small bowel may represent an associated adynamic ileus pattern.    3. Cholelithiasis without acute cholecystitis.    CTU 07/26/19  IMPRESSION   1. Obstructing stone in the proximal to mid left ureter measuring 5 x 4 x 6 mm, with associated hypoperfusion of the left kidney, perinephric and periureteric stranding, and proximal urothelial thickening and hyperenhancement. Superimposed upper urinary tract infection is not excluded. Correlate clinically.   2. Bilateral punctate nonobstructing intrarenal calculi as described above.   3. Benign simple cyst in the posterior left mid kidney. No solid masses are noted.   4. Prostatic enlargement.   5. Decompressed, thick-walled urinary bladder. Question chronic bladder outlet obstruction.   6. Sigmoid diverticulosis with no acute diverticulitis. Normal appendix. Hepatic steatosis. Cholelithiasis with no acute cholecystitis.    Addendum Begins   Addendum report by:  Janean Sark, MD  on  06/05/2020.   Original report by: Janean Sark, MD  on  July 26, 2019.     MRI Prostate w/wo cont 01/29/19  IMPRESSION   1. Enlarged prostate with findings consistent with benign prostate hyperplasia.   2. No focal lesion identified meeting criteria for suspicion per PI-RADS. Of note Gleason 6 disease can be occult on imaging.    No follow-up is required for the benign simple cyst in the posterior left mid kidney.     Thresa Ross, [place]

## 2022-08-14 NOTE — Telephone Encounter (Signed)
Patient called and is requesting a call back . He stated he need to speak with regarding Mri Results. Thanks     Cbn [phone]

## 2022-08-16 LAB — PSA, DIAGNOSTIC: PSA: 7.2 ng/mL — ABNORMAL HIGH (ref 0.0–4.0)

## 2022-08-16 NOTE — Other (Signed)
Called pt regarding PSA. Recommend continued monitor for now due to other more pressing issues.  He also needs a referral to GI for fecal incontinence. I told hm we could send referral since he does not currently have a PCP. Can someone help send referral to [name] or [name]. Thanks

## 2022-08-16 NOTE — Progress Notes (Signed)
Elijah Perry has been referred to [name] or [name] per the orders of Dr. Wynn Banker for the diagnosis of fecal incontinence      Specific provider or location requested  N/A    Patient has a follow up appt: Yes February 11, 2023    Is this a STAT referral: No    Tearra Tripplett

## 2022-08-16 NOTE — Telephone Encounter (Signed)
Referral sent to Dr. [name] or Dr. [name] fax # [phone].

## 2022-08-20 NOTE — Telephone Encounter (Signed)
Referral sent to [facility] Due to insurance .

## 2022-08-27 NOTE — Progress Notes (Signed)
Formatting of this note might be different from the original.  The patient's blood was drawn by venipuncture.  Specimen was processed and sent to the lab.  The patient tolerated the procedure without incident.       Lavender 1, SST  3, Pink  1  were drawn from the patient.    Tommy Ksa CLA  Electronically signed by Lenor Derrick at 08/27/2022 12:48 PM EST

## 2022-08-27 NOTE — Progress Notes (Signed)
Formatting of this note might be different from the original.  08/27/2022    Urine specimen collected and sent to lab.    Curly Rim, MA  Electronically signed by Curly Rim, MA at 08/28/2022 11:52 AM EST

## 2022-08-27 NOTE — Progress Notes (Signed)
Formatting of this note is different from the original.  Family Medicine Office Visit - Service Date: 08/27/22    Medicare Wellness Exam  Elijah Perry, male, medical record number [id number], DOB:28-Apr-1941 presents today for his wellness visit.    Current list of Health Care Providers:  Patient Care Team:  Joetta Manners, NP as PCP - General (Family Practice)    Assessment & Plan   1. Medicare annual wellness visit, subsequent  - Detailed and thorough review of the patient's past medical history and preventative care reviewed with the patient at this visit.   - Health maintenance information reviewed as seen below.  Appropriate screenings, immunizations, and education provided during this visit.  All questions were answered for the patient.    2. Prostate cancer ([place])  - PSA DIAGNOSTIC; Future  - Pt had elevated PSA and dx of prostate cancer with his previous provider. Before moving his urologist got him set up with [facility] referral and the patient is scheduled to see them soon.     3. Mild intermittent asthma without complication  -Chronic and stable condition that the patient uses his Breo inhaler for once daily.  Patient denies any recent exacerbations or difficulty breathing.    4. OSA on CPAP  -Chronic condition that the patient is no longer using CPAP 4.  He reports he has a CPAP and all of the equipment and does not need referral.  Education was provided on the risks of not treating obstructive sleep apnea appropriately.  Patient verbalized understanding.    5. Acquired hypothyroidism  - TSH; Future  -Chronic and stable condition, the patient was previously on thyroid medication but states he has not been on the medication for multiple years.    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  A1c to be checked today and discussion of medication changes as appropriate per results.    7. Mixed hyperlipidemia  - Lipid Complete Panel; Future  -Chronic and stable condition that is currently being managed with lifestyle modifications and diet intervention.    8. Vitamin D deficiency  - VITAMIN D 25 HYDROXY; Future  -Chronic and stable condition, patient takes 50,000 units of vitamin D once a week.  No changes in plan of care at this time    9. Anxiety  10. Current moderate episode of major depressive disorder without prior episode (HCC)  - Chronic and stable condition. Discussion regarding etiology and pathophysiology of anxiety depression. Pt is not interested in utilizing any medication for treatment of these conditions at this time.  - It is noted that patient denies any suicidal or homicidal ideation at this time.  - Patient is not interested in utilizing talk therapy at this time    11. Psychophysiological insomnia  -Chronic and stable condition that does not require any medication intervention at this time.  Discussed sleep hygiene and recommended the patient also consider treating his OSA.  Patient verbalized understanding.    12. Myasthenia gravis ([place])  - REFERRAL TO NEUROLOGY ([facility])  -Chronic and stable condition, patient currently takes pyridostigmine 60 q8 hr. patient was previously being managed by neurology before moving to the area.  Referral placed for chronic management of  this condition.  Patient states that his previous provider plans to continue refills until the patient is able to establish with neurology in the area.    13. Chronic midline low back pain with bilateral sciatica  -Chronic and stable condition, patient reports no significant changes in symptoms.  Patient currently treats back pain with Tylenol as needed.  No changes in plan of care at this time.    14. Gastroesophageal reflux disease without  esophagitis  - REFERRAL TO GASTROENTEROLOGY  - Chronic and stable condition. Well managed with lifestyle modifications.  - Discussed etiology and various contributing factors for reflux symptoms including stress, diet, smoking, and certain medications such as NSAIDs. Sleeping positions also affect reflux such as laying supine. Pt verbalized understanding lifestyle modifications as well as potential side effects of the medications.    15. Irritable bowel syndrome with diarrhea  - REFERRAL TO GASTROENTEROLOGY  -Chronic and stable condition that is currently poorly managed with lifestyle modifications.  Patient would like to discuss options for medication with a GI specialist.  Referral sent.    16. Tremor of left hand  -See myasthenia gravis plan    17. Encounter for screening for HIV  - HIV 1/0/2 ANTIGEN/ANTIBODY WITH REFLEX (RECOMMENDED SCREEN FOR HIV); Future    18. Encounter for hepatitis C screening test for low risk patient  - Hepatitis C Viral Antibody w/Rflx; Future    19. Chronic pain of both knees  - REFERRAL TO ORTHOPEDICS  -The patient states a referral is needed to an orthopedic provider for chronic knee pain that he was previously told he would need surgery for.    20. Vitamin B12 deficiency  - B12; Future  -Patient reports a history of vitamin B-12 deficiency.  Patient is not on any supplementation at this time.  Labs to be evaluated and plan of care adjustments as appropriate.    21. Lower urinary tract symptoms (LUTS)  - tamsulosin (FLOMAX) 0.4 mg PO CAPS; Take 2 Caps by Mouth Once a Day.  Dispense: 180 Cap; Refill: 1  -Patient is currently planning to establish care with urology.  Increase in Flomax from 0.4 mg to 0.8 mg daily in attempt to alleviate nocturnal enuresis and frequency of urination in the evening.  Education also provided regarding lifestyle interventions that can help reduce frequency of urination.  Patient verbalized  understanding.  --------------------------------------------------------------------------------------------------------      Health Maintenance  Flu Immunization: Up-to-date, 03/15/2022  Shingles Immunization: Patient reports he believes this has been completed but is unsure of the date, patient will look in his records and report back to the office  Pneumonia Immunization: Patient reports he believes this has been completed but is unsure of the date, patient will look in his records and report back to the office  COVID Immunization: Up-to-date, 03/15/2022  TD/Tdap Immunization: Up-to-date, 08/27/2020  CRC Screening: n/a, age  DM Screening: Ordered today  Lipid Screening: Ordered today  Skin Cancer Screening: Counseling provided about minimizing exposure to ultraviolet radiation to reduce their risk of skin cancer. Monthly self skin checks encouraged.  Hep C & HIV Screenings: ordered today  PSA Screening: ordered today  Dental Screening: sees a dentist regularly  Vision Screening: sees an eye doctor annually  Hearing Screening: no complaints of hearing concerns  IPV Screening: denies  Smoking Screening: no smoking history  Alcohol Use Screening: once a week  Safety/Fall Risk Screening (STEADI): no falls this year  Function Screening: Independent in 6/6 ADLs (feeding, continence, transferring, toileting, dressing, bathing)  Advanced Care Planning: information provided today  Anxiety Screening: GAD 7 score 0/21  Depression Screening:  PHQ 2/9 Total Score    0  --------------------------------------------------------------------------------------------------------        Return in about 6 months (around 02/25/2023) for DM follow up.   Chief Complaint       Patient presents with    [place] only     Social Review   Marital Status: lives with daughter and husband, with his wife, no safety concerns  Employment: worked for [employer]  Physical Activity: no regular routine, has tricycle not using it currently.  When it warms up  Diet: balanced diet  Smoking: denies  ETOH: 1 drink per week  Stress:  no significant stressors  Social Emotional Support: good support reported    History of Present Illness   -Patient is here to establish care as he is new to the area.  The patient needs a referral to a neurologist to manage his myasthenia gravis.  A referral is needed to an orthopedic provider for chronic knee pain that he was previously told he would need surgery for.  A referral was also needed to GI for management of his chronic IBS.   --------------------------------------------------------------------------------------------------------      Problem: Frequent urination at night  Location: GU  Quality: Chronic  Severity: Mild-moderate   Duration: Multiple months  Timing: At night  Context: Patient reports he gets up 3-4 times at night to use the restroom.  He did have a recent elevated PSA and is seeing urology for all of his treatment options.  Denies any blood in his urine, flank pain, fevers, body chills, or foul smelling urine.  Modifying Factors: Amount of liquid consumed in the evening, caffeine intake,  --------------------------------------------------------------------------------------------------------        Physical Exam   Vital Signs: BP 140/71   Pulse 78   Resp 18   Ht 6' 1"$  (1.854 m)   Wt 107 kg (236 lb)   SpO2 96%   BMI 31.14 kg/m      Physical Exam  Vitals reviewed.   Constitutional:       General: He is not in acute distress.     Appearance: Normal appearance. He is not toxic-appearing.   HENT:      Head: Normocephalic.      Right Ear: Tympanic membrane normal.      Left Ear: Tympanic membrane normal.      Mouth/Throat:      Pharynx: Oropharynx is clear.   Eyes:      Pupils: Pupils are equal, round, and reactive to light.   Cardiovascular:      Rate and Rhythm: Normal rate and regular rhythm.      Pulses: Normal pulses.      Heart sounds:      No friction rub.   Pulmonary:      Effort: Pulmonary  effort is normal. No respiratory distress.      Breath sounds: Normal breath sounds.   Abdominal:      General: Bowel sounds are normal.   Musculoskeletal:         General: Normal range of motion.      Cervical back: Neck supple.   Skin:     General: Skin is warm and dry.      Capillary Refill: Capillary refill takes less than 2 seconds.   Neurological:      General: No focal deficit present.      Mental  Status: He is alert and oriented to person, place, and time. Mental status is at baseline.   Psychiatric:         Mood and Affect: Mood normal.         Behavior: Behavior normal.         Judgment: Judgment normal.     Home Medications     Outpatient Medications Marked as Taking for the 08/27/22 encounter (Office Visit) with Joetta Manners, NP   Medication Sig Dispense Refill    acetaminophen (TYLENOL) 500 mg PO TABS Take 2 Tabs by Mouth Every 8 Hours As Needed. 20 Tab 0    pyRIDostigmine (MESTINON) 60 mg PO TABS Take 1 Tab by Mouth Every 8 Hours.      tamsulosin (FLOMAX) 0.4 mg PO CAPS Take 2 Caps by Mouth Once a Day. 180 Cap 1     Past Histories     Problem History:   Patient Active Problem List   Diagnosis    Acquired hypothyroidism    Anxiety    Arthritis    BPH with obstruction/lower urinary tract symptoms    Chronic midline low back pain with bilateral sciatica    Current moderate episode of major depressive disorder without prior episode (HCC)    Diabetes 1.5, managed as type 2 (HCC)    Difficulty walking    Elevated PSA    Falls frequently    Gastroesophageal reflux disease without esophagitis    Hyperlipidemia    Irritable bowel syndrome with diarrhea    Leg weakness, bilateral    Low vitamin B12 level    Lower urinary tract symptoms (LUTS)    Mild intermittent asthma without complication    Myasthenia gravis (HCC)    OSA on CPAP    Polyneuropathy    Prostate cancer (HCC)    Psychophysiological insomnia    Rosacea    Tremor of left hand    Unspecified ptosis of bilateral eyelids    Urge incontinence     Vitamin D deficiency   Medical History: No past medical history on file.  Immunization History:   There is no immunization history on file for this patient.  Family History: History reviewed. No pertinent family history.  Social History:  reports that he has never smoked. He has never been exposed to tobacco smoke. He has never used smokeless tobacco. He reports that he does not drink alcohol and does not use drugs., Counseling given: No    PPPS/Care Plan was reviewed and a copy given to the patient.    Recent Results & Studies   I have reviewed pertinent labs.    I have reviewed information entered by the clinical staff and/or patient and verified it as accurate or edited where necessary.    Signature:  Joetta Manners, NP  [facility]  Dept: [phone]  Dept Fax: [phone]    Computerized Transcription Disclaimer:  This was dictated using a computer transcription program.  Although proofread, it may contain computer transcription errors and phonetic errors.  Other human proofreading errors may also exist.  Corrections may be performed at a later time.  Please contact us for any clarification needed.   Electronically signed by Joetta Manners, NP at 08/28/2022 11:52 AM EST

## 2022-08-28 LAB — HEMOGLOBIN A1C
Estimated Avg Glucose, External: 116 mg/dL (ref 91–123)
Hemoglobin A1C, External: 5.7 % — ABNORMAL HIGH (ref 4.8–5.6)

## 2022-10-02 NOTE — Telephone Encounter (Signed)
Formatting of this note might be different from the original.  I have reviewed the patient's chart and agree with the plan.    Micheline Rough, MD    Electronically signed by Micheline Rough, MD at 10/02/2022  2:07 PM EDT

## 2022-10-02 NOTE — Progress Notes (Signed)
Formatting of this note is different from the original.  Office Visit - Service Date: 10/02/22  Assessment & Plan   1. Pre-operative clearance  - Patient to undergo L TKA. Surgery to be performed by Dr. Sue Lush on  10/08/22.  - Patient is not on any blood thinners. Advised the patient to avoid NSAIDs such as aspirin OTC for at least one week prior to surgery.  - Patient IS cleared for surgery at this time.  - To be cosigned by MD    2. Other constipation  - docusate sodium (COLACE) 100 mg PO CAPS; Take 1 Cap by Mouth Once a Day.  Dispense: 30 Cap; Refill: 0  - prophylactic because patient is concerned he will be constipated. Educated the surgeon will likely order this but he would like to have it before surgery..  Chief Complaint       Patient presents with    PRE-OP CLEARANCE     History of Present Illness   Patient presents for preop exam. Patient has history of   Past Medical History:   Diagnosis Date    Diarrhea 12/06/2014    Last Assessment & Plan:    Formatting of this note might be different from the original.   Patient with recent occasional (approximately once a week) nonbloody diarrhea. He has made a loose association with his symptoms and consumption of dairy products. He does not typically eat a lot of dairy products, just occasional cheese and rare ice cream. Has always had a history of bowel movements shortly    DOE (dyspnea on exertion) 02/09/2019    Last Assessment & Plan:    Formatting of this note might be different from the original.   Pulmonary consult apprec    High risk medication use 01/08/2020    Formatting of this note might be different from the original.   Last Assessment & Plan:    Formatting of this note might be different from the original.   BMP ordered  Last Assessment & Plan:    Formatting of this note might be different from the original.   BMP ordered    Microscopic hematuria 01/21/2019    Myalgia 01/08/2020    Osteoarthritis of left knee 11/26/2017    Formatting of this note  might be different from the original.   Last Assessment & Plan:    Formatting of this note might be different from the original.   Patient will return to Dr. Morton Stall of orthopedics for steroid injections in left knee.     Last Assessment & Plan:    Formatting of this note might be different from the original.   Referral to orthopedics was placed for continued management ost    Osteoarthritis of right knee 01/19/2013    Formatting of this note might be different from the original.   Last Assessment & Plan:    Formatting of this note might be different from the original.   Instructed patient to discontinue Voltaren and take Tylenol PRN.  Last Assessment & Plan:    Formatting of this note might be different from the original.   Instructed patient to discontinue Voltaren and take Tylenol PRN.  Formatting of this not    Prediabetes 11/26/2017    Formatting of this note might be different from the original.   Last Assessment & Plan:    Formatting of this note might be different from the original.   A1c ordered     Last Assessment & Plan:  Formatting of this note might be different from the original.   A1c today was 5.5.     Recommend no changes, recheck again in 1 year  Last Assessment & Plan:    Formatting of this note might be differ     - Patient to undergo left TKA. Surgery to be performed by Dr. Felton Clinton on  10/08/22.  - The plan is to obtain Spinal anesthesia. No family hx of malignant hyperthermia. No personal hx of poor tolerance to anesthesia.  - Patient is not on any blood thinners. Advised the patient to avoid NSAIDs such as aspirin OTC for at least one week prior to surgery.    - BP well controlled on no medication.   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  Hemoglobin A1c   Date Value Ref Range Status   08/27/2022 5.7 (H) 4.8 - 5.6 % Final     - Surgeon requested EKG, Chest Xray, and Lab work including CMP,  CBC be completed. EKG completed on 10/01/22 NSR.   - Of note the patient has allergies to adhesives, silicone tape, eposy resin. Patient will share this information with the surgeon.  - Patient IS cleared for surgery at this time.    Home Medications     Outpatient Medications Marked as Taking for the 10/02/22 encounter (Office Visit) with Joetta Manners, NP   Medication Sig Dispense Refill    acetaminophen (TYLENOL) 500 mg PO TABS Take 2 Tabs by Mouth Every 8 Hours As Needed. 20 Tab 0    docusate sodium (COLACE) 100 mg PO CAPS Take 1 Cap by Mouth Once a Day. 30 Cap 0    ergocalciferol (VITAMIN D2) 50,000 unit PO CAPS Take 1 Cap by Mouth Every 7 Days.      fluticasone furoate-vilanteroL (BREO) 200-25 mcg/dose INH DsDv Take 1 Puff inhaled by mouth Once a Day.      Meloxicam 15 mg PO TABS Take 1 Tab by Mouth Once a Day. with food      pyRIDostigmine (MESTINON) 60 mg PO TABS Take 1 Tab by Mouth Every 8 Hours.      tamsulosin (FLOMAX) 0.4 mg PO CAPS Take 2 Caps by Mouth Once a Day. 180 Cap 1     Past Histories   The patient's medical, family, and social history (including tobacco usage) were reviewed and updated as appropriate.    Physical Exam   Vital Signs: BP 126/70   Pulse 75   Resp 14   Ht 6\' 1"  (1.854 m)   Wt 108.4 kg (239 lb)   SpO2 94%   BMI 31.53 kg/m      Physical Exam  Recent Results & Studies   I have reviewed information entered by the clinical staff and/or patient and verified it as accurate or edited where necessary.    Signature:  Joetta Manners, NP  [facility]  Dept: [phone]  Dept Fax: [phone]    Computerized Transcription Disclaimer:  This was dictated using a computer transcription program.  Although proofread, it may contain computer transcription errors and phonetic errors.  Other human proofreading errors may also exist.  Corrections may be performed at a later time.  Please contact us for any clarification needed.   Electronically signed by Joetta Manners, NP at 10/02/2022  1:45 PM EDT

## 2022-10-02 NOTE — Progress Notes (Signed)
Formatting of this note is different from the original.     10/02/22 1508   Ortho Joint Preop Class Attendance   Education Date 10/02/22   Attendance Type Virtual     [facility]  Pre-Op Note    Pre-Op Education provided by Ortho/Spine Patient Navigator via email communication. Patient provided with online [facility] and instructions on how to access Pre-Op Video.     Reviewed in pre-op education materials:    Preparing for Surgery - Education on multiple topics regarding the weeks leading up to surgery and surgery day.  Importance of your Coach  Preparing your home for your return after surgery  Medical clearance, PASS call, pre-op testing  Pre-Op showering instructions  Understanding your surgery and anesthesia  General diet considerations  ERAS protocols including pre-op drink  Complication Prevention - Potential complications and reduction methods.  Participation in therapy sessions, frequent mobilization, ankle pumps, and SCD use  Importance of DVT prevention and taking blood thinners as prescribed  Importance and use of incentive spirometer  Use of stool softeners/laxatives  ERAS protocols including chewing gum  Pain Management -  Pain level expectations and multimodal pain control methods.  As needed vs scheduled medications  Ice packs and proper elevation  Therapy - Pre-op exercises as well as therapy expectations following surgery.  Activities of daily living  Anticipated DME    Contact information provided to patient and encouraged to reach out with any questions.    Maylon Cos, BSN, RN, [place]  Ortho/Spine Patient Navigator  Office: [phone]    Electronically signed by Jacinta Shoe, RN at 10/02/2022  3:08 PM EDT

## 2022-10-04 NOTE — Telephone Encounter (Signed)
Formatting of this note might be different from the original.  Patient provided with 3 tablets for treatment of anxiety prior to his invasive surgery.  Electronically signed by Joetta Manners, NP at 10/04/2022  1:53 PM EDT

## 2022-10-04 NOTE — Telephone Encounter (Signed)
Formatting of this note might be different from the original.  Patient having knee replacement on Friday 3/29  Wants medication for nerves   You told them to get them from Dr Annye English and Dr Felton Clinton told them to get it from you  Electronically signed by Karmen Bongo A, PCR at 10/04/2022  1:25 PM EDT

## 2022-10-07 NOTE — Telephone Encounter (Signed)
Formatting of this note might be different from the original.  Note co-signed    Katie-- please forward all pre-op notes to me for co-sign in the future!    Dominica  Electronically signed by Ilda Mori, MD at 10/07/2022  4:29 PM EDT

## 2022-10-07 NOTE — Telephone Encounter (Signed)
Formatting of this note might be different from the original.  Dr. Felton Clinton, surgeon's office needs preop signed by MD. Please update in Epic.  Electronically signed by Bartolo Darter at 10/07/2022  2:46 PM EDT

## 2022-10-10 NOTE — Patient Instructions (Signed)
Formatting of this note is different from the original.  Patient Information   Name: Elijah Perry  Surgeon: Dr. Felton Clinton   MRN: [id number] Surgery Date: 10/11/22      Preparing to Come     Bathe or shower with soap and water or with antibacterial soap/surgical wash if instructed by your physician.   No makeup (cosmetics), hair pieces, artificial eyelashes, aerosol sprays, perfumes, skin creams, roll-on or stick deodorant.  Do not drink alcohol, use tobacco products, vapes, marijuana or other recreation drugs 24 hours prior to your surgery. This may cause your surgery or procedure to be cancelled.  You must arrange for someone to bring you home after your surgery.  Departing in a cab, bus or ride service will not be acceptable.  Please arrange for someone to stay with you for the next 24 hours.  If you are to be admitted, bring a small bag containing pajamas, robe, slippers, and toiletries, cell phone charger.  Do not shave operative area 48-hours prior to surgery.  A legal guardian must accompany minors.  If other than a parent, please bring proof of legal guardianship.  If any feeling of illness comes up (cold, sore throat, nausea, vomiting, fever, or flu), promptly notify your physician.    Food/Liquids     Unless otherwise instructed by your physician or anesthesiologist:  Do not eat any solid food after midnight.  Do not drink any fluids after midnight.  Nothing to eat or drink after midnight. Doing so may cause your surgery or procedure to be cancelled.    Medications     Take only the following medications with a sip of water on the morning of your surgery:   you may take your regular morning medicines on the day of your surgery.     Bring a list of all your medication and dosages with you to the hospital.  Bring your inhalers with you to the hospital.  Please contact your doctor about blood thinners.  Do not use any of these over-the-counter pain medications 7-days prior to your surgery or procedure: Advil, Ibuprofen,  Aleve.    Prep     Please follow preop instructions and/or bowel preparation as directed by physician.    Belongings     All jewelry (rings, earrings, body piercings, metal hair accessories) must be removed prior to going into the operating room.  It may be necessary to cut rings if unable to remove.  Please do not bring valuables (jewelry and excessive amounts of money or credit cards).  You should be prepared to leave these items with whoever accompanies you to the hospital.   If you wear glasses, contact lenses or dentures, they must be removed prior to going to the operating room.  Please bring a case for your glasses or contacts.    Items to Bring with You     Please bring insurance cards, photo ID and any medical device, i.e CPAP, stimulator remote, etc.  Children may bring a "special" item (blanket, stuffed toy) to the hospital.    Electronically signed by Rufina Falco, RN at 10/10/2022 11:42 AM EDT

## 2022-10-10 NOTE — Progress Notes (Signed)
Formatting of this note might be different from the original.  Pt states that he had a skin reaction from the hospital sheets in the past. He will need hypoallergenic sheets when he comes for his surgery 10/11/22.  Electronically signed by Rufina Falco, RN at 10/10/2022 12:14 PM EDT

## 2022-10-11 NOTE — Interval H&P Note (Signed)
Formatting of this note might be different from the original.  I have seen and examined the patient.  I have reviewed the H&P.  There are no interval changes.  Proceed as planned.    The patient has mobility limitation that significantly impairs their ability to participate in one or more mobility-related activities of daily living in the home.  The patient is able to safely use a walker.  The functional mobility deficit can be sufficiently resolved by use of a walker.    Electronically signed by Terie Purser, MD at 10/11/2022 12:53 PM EDT    Source Note - Provider, Unknown - 10/07/2022 12:29 PM EDT

## 2022-10-11 NOTE — Consults (Signed)
Formatting of this note is different from the original.    Consult Note - 10/11/2022  [facility] - Admission Date: 10/11/2022    82 y.o. male presented on 10/11/2022 for elective left knee replacement, admitted under the orthopedics service.  Evaluation requested by Terie Purser, MD for management of chronic medical conditions and assistance with post-operative medical management.    Assessment & Plan:     Principal Problem(s):  Primary osteoarthritis of left knee  S/P left total knee arthroplasty    Active Problems:  Myasthenia gravis  Mild intermittent asthma without complication   BPH  Dyslipidemia  Class 1 obesity with serious comorbidity and body mass index (BMI) of 31.0 to 31.9 in adult     Plan:  -Pre-op labs from 10/01/2022 reviewed  -Primary post-operative management including DVT prophylaxis and pain management per [facility] with PT as directed  -Continue bowel regimen and encourage IS use  -Routine labs ordered for am review  -We will continue the essential home medications    ASA for DVT prophylaxis per [facility]  CODE STATUS is FULL    CC/HPI:     CC: left knee pain    Elijah Perry is a 82 y.o. male with a past medical history that includes HLD, asthma, BPH, myasthenia gravis.  He has a longstanding complaint of left knee pain due to osteoarthritis.  His complaints have been refractory to conservative therapies, necessitating his procedure today.  He has now undergone L TKA with Dr Felton Clinton.  Hospital medicine service is consulted to assist with the management of his chronic medical conditions.    Subjective:     Review of Systems   Respiratory:  Negative for shortness of breath.    Cardiovascular:  Negative for chest pain.   Gastrointestinal:  Negative for nausea.   Musculoskeletal:  Positive for arthralgias (left knee).   All other systems reviewed and are negative.    Home Medication List - Marked as Reviewed on 10/11/22 1244   Medication Sig   acetaminophen (TYLENOL) 500 mg PO TABS  Take 2 Tabs by Mouth Every 8 Hours As Needed.   ALPRAZolam (XANAX) 0.5 mg PO TABS Take 1 Tab by Mouth Every 8 Hours As Needed (anxiety). Indications: anxious   docusate sodium (COLACE) 100 mg PO CAPS Take 1 Cap by Mouth Once a Day.   pyRIDostigmine (MESTINON) 60 mg PO TABS Take 1 Tab by Mouth Every 8 Hours.   tamsulosin (FLOMAX) 0.4 mg PO CAPS Take 2 Caps by Mouth Once a Day.     Allergies   Allergen Reactions    Adhesive Tape-Silicones rash/itching and hives    Epoxy Resin unknown    Laundrey Detergent toxic skin reaction     [place] SHEETS    Adhesive hives and rash/itching     Other reaction(s): rash/itching     Past Medical History:   Diagnosis Date    Diarrhea 12/06/2014    Last Assessment & Plan:    Formatting of this note might be different from the original.   Patient with recent occasional (approximately once a week) nonbloody diarrhea. He has made a loose association with his symptoms and consumption of dairy products. He does not typically eat a lot of dairy products, just occasional cheese and rare ice cream. Has always had a history of bowel movements shortly    DOE (dyspnea on exertion) 02/09/2019    Last Assessment & Plan:    Formatting of this note  might be different from the original.   Pulmonary consult apprec    High risk medication use 01/08/2020    Formatting of this note might be different from the original.   Last Assessment & Plan:    Formatting of this note might be different from the original.   BMP ordered  Last Assessment & Plan:    Formatting of this note might be different from the original.   BMP ordered    Microscopic hematuria 01/21/2019    Myalgia 01/08/2020    Myasthenia gravis without (acute) exacerbation (HCC)     Osteoarthritis of left knee 11/26/2017    Formatting of this note might be different from the original.   Last Assessment & Plan:    Formatting of this note might be different from the original.   Patient will return to Dr. Morton Stall of orthopedics for  steroid injections in left knee.     Last Assessment & Plan:    Formatting of this note might be different from the original.   Referral to orthopedics was placed for continued management ost    Osteoarthritis of right knee 01/19/2013    Formatting of this note might be different from the original.   Last Assessment & Plan:    Formatting of this note might be different from the original.   Instructed patient to discontinue Voltaren and take Tylenol PRN.  Last Assessment & Plan:    Formatting of this note might be different from the original.   Instructed patient to discontinue Voltaren and take Tylenol PRN.  Formatting of this not    Prediabetes 11/26/2017    Formatting of this note might be different from the original.   Last Assessment & Plan:    Formatting of this note might be different from the original.   A1c ordered     Last Assessment & Plan:    Formatting of this note might be different from the original.   A1c today was 5.5.     Recommend no changes, recheck again in 1 year  Last Assessment & Plan:    Formatting of this note might be differ     History reviewed. No pertinent surgical history.  Family History   Problem Relation Age of Onset    Anesthesia Reaction Neg Hx      Social History     Occupational History    Not on file   Tobacco Use    Smoking status: Former     Current packs/day: 0.50     Average packs/day: 0.5 packs/day for 60.2 years (30.1 ttl pk-yrs)     Types: Cigarettes     Start date: 1964     Passive exposure: Never    Smokeless tobacco: Never   Vaping Use    Vaping status: Never Used   Substance and Sexual Activity    Alcohol use: Not Currently    Drug use: Never    Sexual activity: Not on file     Exam:     BP: 133/93  Heart Rate: (not recorded)  Pulse: 76 (10/11/22 1240)  Temp: 97.7 F (36.5 C)  Resp: 16  Height: 6\' 1"  (185.4 cm)  Weight: 108 kg (238 lb 1.6 oz)  BMI (Calculated): 31.42  SpO2: 95 %  Temp (24hrs), Avg:97.7 F (36.5 C), Min:97.7 F (36.5 C), Max:97.7 F (36.5  C)    Physical Exam  Vitals and nursing note reviewed.   Constitutional:       General: He is awake.  He is not in acute distress.     Appearance: Normal appearance. He is obese. He is not ill-appearing.      Interventions: Nasal cannula in place.   HENT:      Head: Normocephalic and atraumatic.      Mouth/Throat:      Mouth: Mucous membranes are dry.   Eyes:      General: No scleral icterus.  Cardiovascular:      Rate and Rhythm: Normal rate and regular rhythm.      Heart sounds: S1 normal and S2 normal. No murmur heard.  Pulmonary:      Effort: Pulmonary effort is normal. No tachypnea, accessory muscle usage or respiratory distress.      Breath sounds: Normal breath sounds.   Abdominal:      General: Bowel sounds are normal.      Palpations: Abdomen is soft.      Tenderness: There is no abdominal tenderness.   Musculoskeletal:      Left knee: Decreased range of motion. Tenderness present.      Right lower leg: No edema.      Left lower leg: No edema.      Comments: S/P L TKA, L knee in ACE/ice wrap   Skin:     General: Skin is warm and dry.   Neurological:      Mental Status: He is alert and oriented to person, place, and time.      Comments: NV intact distally bilateral lower extremities, sensation absent distally but is present proximally   Psychiatric:         Mood and Affect: Mood normal.         Behavior: Behavior normal. Behavior is cooperative.     A portion of this note was composed using dictation software and thus may contain word substitutions or other dictation errors, although every effort was made to ensure accuracy. Thank you for your understanding and patience. Please call/page if any clarification needed.    Gean Birchwood, ACNP-C  [place],  [facility]    10/11/2022 3:24 PM    Electronically signed by Carollee Sires, MD at 10/11/2022  3:48 PM EDT    Associated attestation - Carollee Sires, MD - 10/11/2022  3:48 PM EDT  Formatting of this note might be different from the  original.  I personally obtained history, examined the patient and directed the development of the management plan.  I agree with the findings as documented except as I have noted.    I have independently performed the substantive portion of the visit including the medical decision making and formulation of the assessment and plan.    My independent encounter is notable for 82 year old male with past medical history significant for myasthenia gravis, asthma, BPH, hyperlipidemia admitted for elective left knee arthroplasty.  Patient was seen in PACU.  Denies any complaints  Laboratory data from 10/01/2022 unremarkable.  Stable vital signs    Assessment plan:  # Status post left knee arthroplasty: Postop management per orthopedic surgery.  -On aspirin 81 mg twice daily for DVT prophylaxis.  Continue PPI for stomach protection    # Myasthenia gravis:  -Continue pyridostigmine  -Monitor respiratory status and avoid medication which can exacerbate myasthenia gravis.    Continue home medications.  Rest of the plan as below.    Carollee Sires, MD  3:46 PM, 10/11/2022

## 2022-10-12 NOTE — Progress Notes (Signed)
Formatting of this note might be different from the original.  Discharge instructions and prescriptions were reviewed with patient. All questions answered. Vitals signs stable, will continue to monitor patient until they are discharged from hospital.    Electronically signed by Gwendlyn Deutscher, RN at 10/12/2022 10:36 AM EDT

## 2022-10-12 NOTE — Progress Notes (Signed)
Formatting of this note might be different from the original.  SHIFT CHANGE DISCUSSION AT BEDSIDE     Was completed by:    Off Going RN (First Name, Last Name Initial):Tolulope K  Oncoming RN (First Name, Last Name Initial):Shayna C    Communication Whiteboard updated and completed.yes    Thank you for allowing [place] to participate in your care.    Electronically signed by Noel Christmas, RN at 10/12/2022  7:15 AM EDT

## 2022-10-12 NOTE — Case Communication (Signed)
Formatting of this note is different from the original.     10/12/22 1010   Initial Assessment   Interview completed with Patient   Tell me more about why you came to the hospital. Knee surgery   Where did the patient reside prior to admission? Home/Apartment   How many stairs into home? 2SH/1STE   Bedroom location? Downstairs   Bathroom location? Upstairs/Downstairs   Do you have someone who you would like involved in your care or would be available to assist you upon discharge? Yes   Patient Caregiver understands and accepts role? Yes   Patient Caregiver Name Pala   Patient Caregiver Relationship spouse   Patient Caregiver Contact Information [phone]   Best time of day to contact any   Patient Caregiver Passcode none   How hard is it for you to pay for the very basics like food, housing, medication, medical care, transportation to these appts, and heating? No issues reported   Functional Status prior to admission   Basic ADL's (Activities of Daily Living) - i.e. Personal Hygiene, Dressing, Eating, Continence, Transferring/Mobility Independent;Use of devices   Instrumental ADL's (IADL's) - i.e. Communication, Transportation, Meal Preparation, Shopping, Housework, Lexicographer ADL's (medical care) - i.e. Taking Medications, administering medical treatments Independent   Primary Care Physician (PCP) Verification Accurate   Did the patient have [place] prior to admission? No   Assistive devices used at Home   Oxygen Devices None   Other Equipment Walker;Cane  (chair left for upstairs)   DME Provider(s) none   Additional Resources   What other community resources has the patient utilized in the past? None   Does patient have prescription drug coverage? Yes   When you think about your health, what is most important to you? Staying active   What are you and your family/caregivers preferences for where you go for care after the hospital? home with outpt rehab.   Anticipated Transition Plan  (Discharge Disposition) Home with Outpatient Services   Alternative Transition Plan Home   Choice List Offered/Provided [place] list   Assessment Completed Patient does not require case management services at this time.     Elenor Quinones RN, BSN  Inpatient Case Management  [phone]    Electronically signed by Liliana Cline, RN at 10/12/2022 10:12 AM EDT

## 2022-10-12 NOTE — Case Communication (Signed)
Formatting of this note is different from the original.     10/12/22 1012   Case Management Discharge Sign-Off    Case Management Discharge Sign-Off Transition Plan Complete - Ready for Discharge     ICM noted DC order, pt clear for DC from ICM standpoint.    Pt has a walker, and outpt rehab set up.     Family will transport home at discharge.     Elenor Quinones RN, BSN  Inpatient Case Management  [phone]    Electronically signed by Liliana Cline, RN at 10/12/2022 10:13 AM EDT

## 2022-10-12 NOTE — Discharge Summary (Signed)
Formatting of this note is different from the original.  Images from the original note were not included.       Discharge Summary     Admit Date: 10/11/2022  Discharge Date:  10/12/2022    Patient ID: [id number]  Elijah Perry  82 y.o.  1940-08-10  Room #: ORP/ORP-49  Chief Complaint: Left Knee Pain    Active Hospital Problems    Diagnosis     Primary osteoarthritis of left knee     DJD (degenerative joint disease) of knee     Class 1 obesity with serious comorbidity and body mass index (BMI) of 31.0 to 31.9 in adult     Status post total left knee replacement        Discharge Medication List       START taking these medications      aspirin EC 81 mg Tbec  Take 1 Tab by Mouth Twice Daily. Take twice daily x 6 weeks. Please take all of this medication. It is your blood thinner for blood clot prevention after your joint replacement.  Dispense: 84 Tab  Refills: 0  Commonly known as: Ecotrin Low Strength    meloxicam 7.5 mg Tabs  Start date: October 12, 2022  Take 1 Tab by Mouth Once a Day for 10 days.  Dispense: 10 Tab  Refills: 0  Commonly known as: Mobic    omeprazole 20 mg Cpdr  Take 1 Cap by Mouth Once a Day. Please take this medication while you are taking Aspirin.  Dispense: 42 Cap  Refills: 0  Commonly known as: PriLOSEC    oxyCODONE 5 mg Tabs  Take 1-2 Tabs by Mouth Every 4 Hours As Needed (PAIN).  Dispense: 42 Tab  Refills: 0  Commonly known as: Roxicodone    senna-docusate 8.6-50 mg Tabs  Take 1 Tab by Mouth Twice Daily.  Dispense: 30 Tab  Refills: 0  Commonly known as: Senokot-S          CHANGE how you take these medications      acetaminophen 500 mg Tabs  Take 2 Tabs by Mouth Every 8 Hours for 10 days.  Refills: 0  Commonly known as: TylenoL  What changed:   when to take this  reasons to take this          CONTINUE taking these medications      ALPRAZolam 0.5 mg Tabs  Take 1 Tab by Mouth Every 8 Hours As Needed (anxiety). Indications: anxious  Dispense: 3 Tab  Refills: 0  Commonly known as: Xanax    docusate sodium  100 mg Caps  Take 1 Cap by Mouth Once a Day.  Dispense: 30 Cap  Refills: 0  Commonly known as: Colace    pyRIDostigmine 60 mg Tabs  Take 1 Tab by Mouth Every 8 Hours.  Refills: 0  Commonly known as: Mestinon    tamsulosin 0.4 mg Caps  Take 2 Caps by Mouth Once a Day.  Dispense: 180 Cap  Refills: 1  Commonly known as: Flomax          Operative Procedures:  Left Total Knee Arthroplasty    Consultants:  Dr. Carollee Sires, [facility] Course: Patient is a 82 yo male with LEFT knee pain unresponsive to conservative treatment.He, therefore, elected to undergo surgical intervention. For full details please see pre operative H&P. Patient was admitted to [facility] on 10/11/2022 for elective [facility] -TOTAL KNEE ARTHROPLASTY [[id number]] (ARTHROPLASTY KNEE REPLACEMENT)  PR ARTHROPLASTY KNEE HINGE PROSTHESIS [  [place]; MED [id number] which he underwent that same day by Dr. Sue Lush. Postoperatively Dr. Lyndel Safe was consulted for medical management. Patient's H/H remained stable and did not require a blood transfusion. Patient was placed on EC ASA BID for DVT prophylaxis postoperatively. Patient was mobilized with PT after surgery. Patient was mobilizing well prior to D/C home. Patient's pain was well controlled on oral oxycodone prior to D/C home.     Physical Exam on Discharge:  BP 123/66   Pulse 76   Temp 97.8 F (36.6 C)   Resp 19   Ht 6\' 1"  (1.854 m)   Wt 108 kg (238 lb 1.6 oz)   SpO2 94%   BMI 31.41 kg/m     Incision LEFT KNEE healing well without signs of infection  Both calves soft and non tender to palpation  Neurovascular status intact both lower extremities    Condition at discharge: Stable.    Disposition:  Patient will be discharged home.     Discharge Procedure Orders   CHEST PA AND LATERAL    DO NOT ADD COMMENTS TO RADIOLOGY HERE. USE COMMENTS BOX ABOVE.    Preferred location(s):        Restrictions as Follows:    Ok to start outpatient PT as already scheduled. WBAT  LLE, ROM, ADLs, gait training     Wound Care As Follows:    Keep Aquacel AG dressing in place to LEFT knee.  Patient to remove Aquacel AG dressing on 10/18/2022 (post operative day 7) and leave incision open to air.  Okay to shower w/ Aquacel AG dressing in place.  Ice pack PRN pain & swelling.     Additional Instructions as Follows:    1)  Take ASA 81 mg by mouth twice daily w/ food x 6 weeks post op for DVT prophylaxis.  2)  TED hose to be worn to bilateral lower extremity for a minimum of 4 weeks post op.  Okay to remove in PM and reapply in AM.  3)  Call location of your choice to schedule your outpatient physical therapy appointments.            Additional Instructions as Follows:    TOTAL KNEE REPLACEMENT DISCHARGE INSTRUCTIONS      Walk as much as possible in the house, but do not over tire yourself.  It is better to take short, frequent walks rather than one long one.      Work hard keeping the knee fully extended when you are lying down. Never place anything underneath the back of your knee.  Achieving full extension is very important.      When sitting, the patient should have the knee flexed and be performing flexion exercises.      Do not squat or kneel in the first three months after your surgery.  After this time, you may squat or kneel to comfort.      Do not sit for more than 30 minutes at one time without getting up and moving around.  (Ankles may swell if you sit too long).      Continue to wear your TED stockings until your physician gives you permission to discontinue their use.      You may walk with the walker initially, weight-bearing as tolerated, heel to toe.  Your therapist will progress you to a cane when they feel you are ready, and can actively straight leg raise and have adequate quad strength and function.  Remove all throw rugs from the floors to help prevent accidental falls.      Call your surgeon if your knee incision drains, if your knee swells or hurts more than when you were  in the hospital.      Notify your medical doctor if you develop any of the following:        A.  Pain in your calf or thigh or any unusual swelling or discoloration of your legs.        B.  Chest pain when taking a deep breath .        C.  A fever.    PAIN MEDICATIONS  You have been prescribed medication for pain. Please review all side effects and drug interactions with your pharmacist. It is best to obtain all of your medications from the same pharmacy. This will ensure that your medications will be checked for possible interactions. Pain medications usually have sedating effects and can effect your judgement. They will decrease your ability to operate machinery or vehicles safely. DO NOT participate in activities where you, or someone else, has a risk of injury. DO NOT drive, operate tools, operate machinery, or make legal decisions while taking this medication.     Most prescription pain medications may cause constipation. We recommend taking an over-the-counter stool softener as directed or a medication called Miralax as directed while taking pain medications.     NARCAN PRESCRIPTION    Per the new state of [place] legislation, you may have been prescribed NARCAN/NALOXONE nasal spray. This is because 1 (or more) of your medications (sedating or narcotic medications) meet the state legislation requiring the prescription of narcan. This should be used for emergency purposes during suspected pain medication overdose.     WHEN TO USE  If someone does not wake up with things like pinching the earlobe or rubbing the breast bone with your knuckles. Breathing and heart rate has slowed or stopped. Lips are blue and/or fingers. CALL 911!    *Narcan reverses the effects of narcotics in emergencies such as: unresponsiveness and breathing difficulties. If this medicine is used, THEN YOU MUST call 911 or your local emergency number.    INCENTIVE SPIROMETER USE - Extremely important during your recovery.     Importance of using:  When you empty out and refill the air in your lungs, you get rid of fluid and germs that can lead to an infection. You also exercise your lungs, so that they're able to put more oxygen into your body. That helps you to heal and avoid lung infections!    1. Sit on the edge of your bed if possible, or sit up as far as you can in bed.   2. Hold the incentive spirometer in an upright position.   3. Place the mouthpiece in your mouth and seal your lips tightly around it.  4. Breathe in slowly and as deeply as possible. Notice the piston rising toward the top of the column. Hold your breath for a few seconds. Then exhale slowly and allow the piston to fall to the bottom of the column.   5. Rest for a few seconds and repeat steps one to 5 at least 10 times every hour. An important reminder can be each time a commercial appears while watching television.  6. Position the indicator on the left side of the spirometer to show your best effort. Use the indicator as a goal to work toward during each slow deep breath.  7.  After each set of 10 deep breaths, cough to be sure your lungs are clear.      Call the office with any questions or concerns at [phone]     Follow Up with Surgeon    Follow up with Dr Sue Lush in 3 weeks for wound check.  Call [phone] to schedule your post operative follow up appointment.     EKG 12-LEAD    DO NOT ADD COMMENTS TO RADIOLOGY HERE. USE COMMENTS BOX ABOVE.    Preferred location(s):        CBC     If this is an expected critical, do you want to be called? YES      APTT     PT-INR     BASIC METABOLIC PANEL    Test includes BUN, CO2, Chloride, Creatinine, Glucose, Potassium, Calcium and Sodium.     Home Medical Equipment: Other Items    walker with wheels. Length of need = 99 months.     PCP:  Joetta Manners, NP    [facility], [facility]      Electronically signed by Isabell Jarvis, DO at 10/12/2022  9:41 AM EDT

## 2022-10-12 NOTE — Progress Notes (Signed)
Formatting of this note is different from the original.  Hospitalist Progress Note      Assessment and Plan:       Primary osteoarthritis of left knee  S/P left total knee arthroplasty  Myasthenia gravis  BPH  Dyslipidemia  Class 1 obesity with serious comorbidity and body mass index (BMI) of 31.0 to 31.9 in adult     Plan:  Labs and vitals reviewed, pt feeling well and eager for DC, reports no hx of Asthma, had a brief period of suspect COPD but PFTs ruled this out  Stable for DC    Code Status:   Code Status Information       Code Status    FULL               Interval History       Feel well     Review of Systems:     Review of all systems is negative except as mentioned in Interval history    Current Medications:     acetaminophen (TylenoL) tablet 1,000 mg  acetaminophen (TylenoL) tablet 1,000 mg  aspirin EC tablet 81 mg  bisacodyL (Dulcolax) suppository 10 mg  diphenhydrAMINE (BenadryL) capsule 25 mg  diphenhydrAMINE (BenadryL) injection 12.5 mg  docusate sodium (Colace) capsule 100 mg  hydrALAZINE (Apresoline) injection 10 mg  magnesium hydroxide (Milk of Magnesia) suspension 30 mL  meloxicam (Mobic) tablet 15 mg  meloxicam (Mobic) tablet 7.5 mg  morphine injection 1 mg   Or  morphine injection 2 mg   Or  morphine injection 3 mg  NS Flush injection 10 mL  NS Flush injection 10 mL  NS Flush injection 10 mL  omeprazole (PriLOSEC) capsule 20 mg  ondansetron (PF) (Zofran) injection 4 mg  ondansetron (Zofran) tablet, rapid dissolve 4 mg  oxyCODONE (Roxicodone) tablet 5 mg   Or  oxyCODONE (Roxicodone) tablet 10 mg  polyethylene glycol (Miralax) packet 17 g  pyRIDostigmine (Mestinon) tablet 60 mg  senna-docusate (Senokot-S) 8.6-50 mg 1 Tab  sodium chloride (normal saline) 0.9% infusion  tamsulosin (Flomax) capsule 0.8 mg  therapeutic multivitamin 1 Tab        Home medications are independently reviewed by me.    Physical Examination:     General: No apparent distress  HEENT: anicteric  Neck: supple, without  JVD  Chest: CTAB, without tachypnea or accessory muscle use  Heart: Regular without murmurs, rubs or gallops  Abdomen: bowel sounds are present.  Nontender, nondistended without guarding or rebound  Extremities: without edema.  Neuro: CN intact. Moves all 4 extremities  Psych: Mood is appropropriate. Alert and oriented.      Constitutional: BP 124/59   Pulse 54   Temp 97.1 F (36.2 C)   Resp 18   Ht 6\' 1"  (1.854 m)   Wt 108 kg (238 lb 1.6 oz)   SpO2 94%   BMI 31.41 kg/m       Labs:     I have reviewed all laboratory results up to this point in the patients current encounter and have incorporated these into the assessment/plan and care of this patient.    Results for orders placed or performed during the hospital encounter of 10/11/22 (from the past 24 hour(s))   GLUCOSE SCREEN   Result Value Ref Range    Glucose POC 131 (H) 70 - 99 mg/dL   GLUCOSE SCREEN   Result Value Ref Range    Glucose POC 154 (H) 70 - 99 mg/dL   GLUCOSE SCREEN  Result Value Ref Range    Glucose POC 197 (H) 70 - 99 mg/dL   CBC   Result Value Ref Range    WBC 10.7 4.0 - 11.0 K/uL    RBC 4.08 3.80 - 5.80 M/uL    HGB 12.7 12.6 - 17.1 g/dL    HCT 37.9 37.8 - 52.2 %    MCV 93 80 - 95 fL    MCH 31 26 - 34 pg    MCHC 34 31 - 36 g/dL    RDW 12.5 10.0 - 15.5 %    Platelet 212 140 - 440 K/uL    MPV 9.5 9.0 - 13.0 fL   BASIC METABOLIC PANEL   Result Value Ref Range    Potassium 4.0 3.5 - 5.5 mmol/L    Sodium 136 133 - 145 mmol/L    Chloride 102 98 - 110 mmol/L    Glucose 172 (H) 70 - 99 mg/dL    Calcium 8.5 8.4 - 10.5 mg/dL    BUN 19 6 - 22 mg/dL    Creatinine 1.1 0.8 - 1.6 mg/dL    CO2 26 20 - 32 mmol/L    eGFR >60.0 >60.0 mL/min/1.73 sq.m.    Anion Gap 8.0 3.0 - 15.0 mmol/L       IMAGING:     I have reviewed all reports up to this point in the patients current encounter and have incorporated these into the assessment/plan and care of this patient.  KNEE LIMITED LEFT   Final Result    Total left knee arthroplasty, without complication.       Signed By: Jadene Pierini, MD on 10/11/2022 3:16 PM           I have reviewed patient's old medical records and they have been summarized in HPI.    Discussed the plan of care with the patient/family if available.  They verbalized understanding of the plan of care.  All of their questions were answered.    Electronically signed by:  Flossie Dibble, DO    Electronically signed by Flossie Dibble, DO at 10/12/2022 10:15 AM EDT

## 2022-10-12 NOTE — Progress Notes (Signed)
Formatting of this note might be different from the original.  Images from the original note were not included.    Electronically signed by Noel Christmas, RN at 10/12/2022  7:53 AM EDT

## 2022-10-12 NOTE — Care Plan (Signed)
Formatting of this note might be different from the original.    Problem: Fall Prevention  Goal: Prevent/Manage Accidental Injury (Falls)  Description: Consider requesting a pharmacologic review as needed.  Consider asking the MD to consult PT / OT for an evaluation.  Outcome: Progressing    Problem: Adult Inpatient Plan of Care  Goal: Plan of Care Review  Outcome: Progressing  Flowsheets (Taken 10/12/2022 0032)  Plan of Care Reviewed With: patient  Goal: Absence of Hospital-Acquired Illness or Injury  Outcome: Progressing  Goal: Optimal Comfort and Wellbeing  Outcome: Progressing  Goal: Readiness for Transition of Care  Outcome: Progressing    Problem: Surgery Nonspecified  Goal: Absence of Bleeding  Outcome: Progressing  Goal: Effective Bowel Elimination  Outcome: Progressing  Goal: Fluid and Electrolyte Balance  Outcome: Progressing  Goal: Blood Glucose Level Within Targeted Range  Outcome: Progressing  Goal: Absence of Infection Signs and Symptoms  Outcome: Progressing  Goal: Anesthesia/Sedation Recovery  Outcome: Progressing  Goal: Optimal Pain Control and Function  Outcome: Progressing  Goal: Nausea and Vomiting Relief  Outcome: Progressing  Goal: Effective Urinary Elimination  Outcome: Progressing  Goal: Effective Oxygenation and Ventilation  Outcome: Progressing    Electronically signed by Noel Christmas, RN at 10/12/2022 12:33 AM EDT

## 2022-10-14 NOTE — Progress Notes (Signed)
Formatting of this note might be different from the original.  -Transitions of care chart review only.  -Patient admitted/discharged by [facility]  -No [facility] RN needs are identified at this time  -Per d/c summary patient will follow up with ortho surgeon Dr. Sue Lush    Patient ID: [id number]  Elijah Perry  82 y.o.  1940/12/25  Admit Date: 10/11/2022  Discharge Date:  10/12/2022  Operative Procedures:  Left Total Knee Arthroplasty     Reason for Visit:  Transitions Management    Type of Nursing Service Rendered:  Chart Review    Time Spent on Activity:  16-30 minutes    Electronically signed by Gloris Ham, RN at 10/14/2022  8:34 AM EDT

## 2023-02-04 ENCOUNTER — Encounter: Primary: Student in an Organized Health Care Education/Training Program

## 2023-02-04 NOTE — Telephone Encounter (Signed)
Patient establishing care under different provider.

## 2023-02-11 ENCOUNTER — Encounter: Attending: Urology | Primary: Student in an Organized Health Care Education/Training Program

## 2023-03-20 NOTE — Periop Note (Signed)
 PREOPERATIVE INSTRUCTIONS  Please Read Carefully    [x]  Your procedure is scheduled on: 03/21/23     [x]  The day before your surgery, call the surgeon's office to check on the surgery time and the time you should arrive.     [x]  On the day of your surgery, arrive at the time given to you by your surgeon and check in at the first floor registration desk.    [x]  Standard NPO Guidelines per Anesthesia:  [x]  DO NOT eat anything after midnight before your surgery. This includes gum, mints or hard candy.    [x]  May have water, black coffee (NO CREAM) or regular Gatorade (not sugar free) up to 3 hours prior to surgery start time, but no more than 6 ounces per hour (approx.  cup).    []  GLP-1 Medication NPO instructions per Anesthesia:  []  DO NOT eat anything after 5:00pm the night before your surgery. This includes gum, mints or hard candy.      []  May have Sips of water, black coffee (NO CREAM) or regular Gatorade (not sugar free) from 5:00pm to Midnight the night before your surgery.     []  DO NOT eat or drink anything after Midnight the night before your surgery.    []  Cardiac surgery NPO instructions per Anesthesia:  []  Please consume your Ensure PreSurgery drink no later than two hours prior to surgery start time.      []  Entire drink should be consumed within 5-10 minutes of starting.      [x]  You may brush your teeth the morning of surgery, however DO NOT swallow any water.    []  No smoking after midnight.  Smoking should be reduced a few days prior to surgery.    [x]  If you are having an outpatient procedure, you must have a responsible adult bring you to the hospital.  They must remain in the surgical waiting area for the duration of your procedure and provide you with transportation home.  You may not drive yourself home.  We also recommend that you arrange for a responsible person to stay with you for 24 hours following your procedure.  Failure to comply with these instructions may result in  cancellation of the procedure.    []  A parent or legal guardian must accompany minors (under the age of 1) to the hospital.  LEGAL GUARDIANS MUST bring custody papers with them the day of surgery.  A parent/legal guardian will be required to remain at the hospital throughout the minor's duration of stay--To include over night, if minor is admitted to the hospital.      []  If the lab gives you a blue blood ID band, do not throw it away.  Bring it with you on the day of surgery.      []  Please return to preop surgical testing no more than 14 days before surgery date to have your type and screen done prior to surgery.    []  If you have been diagnosed with Sleep Apnea and are using a CPAP, BiPAP, and/or Bi-Flex machine, please bring it with you the day of surgery.    [x]  Endoscopy patients, please follow the instructions provided by the doctors office.    [] If crutches are ordered, you must get them and be instructed on proper use before the day of surgery.  Please bring them with you the day of surgery.      PREPARING THE SKIN BEFORE SURGERY Can Reduce the Risk of Infection  []   You have been given a Chlorhexidine skin preparation packet with instructions to bathe the night before surgery and the morning of surgery prior to arrival.    []  If allergic to Chlorhexidine, use Dial (antibacterial) soap to bathe your skin the night before surgery and the morning of surgery.    []  Do not shave your face, underarms, legs or any part of your body at least 48 hours before surgery.  Any clipping needed for surgery will be done at the hospital.      PREPARING TO COME TO THE HOSPITAL  [x]  Wear casual, loose fitting comfortable clothes the day of surgery.    [x]  Remove ALL jewelry, including body piercings (failure to remove jewelry/piercings/dermal implants prior to surgery may contribute to electrical burns, infections, skin tears, airway obstruction, loss of circulation/oxygenation).  Leave jewelry and all valuables at home.   Leave suitcases and/or overnight bag at home or in the car for a family member to bring when you get a room.    [x]  DO NOT wear any makeup, deodorant, body lotion, aftershave or contact lenses the day of surgery.   Please bring your glasses and glasses case with you the day of surgery.    [x]  DO NOT wear any fingernail polish, artificial fingernails, or fingernail decoration (nothing other than your natural nails).  The fingernail is used to measure oxygen delivery to the body, these items inhibit the ability to measure this--surgery cannot be performed without the ability to accurately measure oxygen delivery to the body.    [x]  Bring any additional paperwork, such as doctors orders, consent form, POA (power of attorney), and/or advanced directives with you the day of surgery.    [x]  Please notify your surgeon of any changes in your condition such as fever, sore throat or rash as soon as possible before your surgery date.  After office hours, call your surgeon's answering service.    [x] Bring picture identification and insurance cards with you the day of surgery.      MEDICATIONS:  [x]  Stop taking aspirin and aspirin products/NSAIDs (non-steroidal anti-inflammatory drugs such as Motrin, Aleve, Advil, ibuprofen and BC Powder), vitamins and herbal pills 2 weeks before surgery OR as instructed by your doctor.  However, if you take a daily aspirin, please contact your primary care provider (PCP), for instructions prior to surgery.    []  Contact your doctor regarding any blood thinner medications you take (such as Coumadin, Plavix, etc.) for instructions about what to do prior to surgery.    [] If you have diabetes, hold oral diabetic medications the night before surgery and the morning of surgery.  Hold insulin the morning of surgery unless told otherwise by your doctor.    [x]  If you have asthma or use inhalers please bring them the day of surgery.    [x] Take the following medications (oral medications with a sip of  water)  the day of surgery:           If needed:  acetaminophen (TYLENOL) 500 MG tablet          Prior to Visit Medications    Medication Sig Taking? Authorizing Provider   tamsulosin  (FLOMAX ) 0.4 MG capsule Take 1 capsule by mouth at bedtime Yes [provider]   BREO ELLIPTA 200-25 MCG/ACT AEPB inhaler INHALE 1 PUFF BY MOUTH DAILY AS NEEDED  [provider]   pyridostigmine (MESTINON) 60 MG tablet Take 1 tablet by mouth 3 times daily  [provider]   vitamin D (  ERGOCALCIFEROL) 1.25 MG (50000 UT) CAPS capsule Take 1 capsule by mouth every 7 days  [provider]   acetaminophen (TYLENOL) 500 MG tablet Take 2 tablets by mouth every 8 hours as needed  [provider]        *Visitor Policy-Surgical/Procedural Areas: Two people may escort the patient to the department's waiting room and may stay there during the procedure.  One visitor must be 88 years of age or older and the patient must arrive with their post procedure transportation home before procedure start time. The post-procedure transporting party must remain within the surgical waiting room for the duration of the procedure. Failure may result in the cancellation of the procedure*  Surgical Admitting Unit (SAU)/Phase II/Post Anesthesia Care Unit (PACU): Patients are allowed one visitor at a time. Visitors must be 16 or older. Note: If the patient's time in PACU is expected to be less than one hour, visitation may occur in Phase II Recovery or the patient's room. Visitation is generally limited to 5 - 10 minutes. Pediatric patients are allowed two visitors to accommodate both parents if the patient is hemodynamically stable.    We want you to have a positive experience at [facility].  If any of these these instructions are not met, it is possible that your surgery will be canceled.  If you have any questions regarding your surgery, please call [facility] at [phone] or your surgeon's office  for further assistance.           [facility]    [facility] Anesthesia     There are many ways to perform anesthesia for surgery.  The 2 techniques are generally classified as General Anesthesia and Regional Anesthesia.  While both techniques are very safe, they are distinctly different.  As with any anesthesia, there are risks, which may be increased if you already have heart disease, chronic lung conditions, or other serious medical problems.    General anesthesia puts you to sleep for your surgery.  It acts on your brain and nerves, and affects your entire body.  It may be administered by an injection, or through inhaling medication.  After you are asleep, a breathing tube may be placed in your windpipe to help you breathe during surgery.  General anesthesia may be more appropriate for longer or more involved surgery, especially if the position you will be in surgery is uncomfortable.  With general anesthesia you may experience a sore throat and hoarse voice for a few days, headache, nausea/vomiting, drowsiness, or blood pressure and breathing problems.    Regional anesthesia involves blocking the nerves to a specific area of the body with local anesthesia medication (numbing medicine).  It is usually given in conjunction with varying degrees of twilight sedation.  When at all possible, the recommended type of anesthesia for both total knee replacement (TKA) and total hip replacement (THA) is a regional anesthesia technique.  This can be achieved utilizing a spinal block technique, or third placement of an epidural catheter.  In addition, your anesthesiologist may recommend that you have a peripheral nerve block placed to help with pain after the procedure.    -Spinal block-In a spinal block, local anesthetic (i.e. numbing medicine) is injected into the fluid that baths the spinal cord in the lower part of your back.  This produces a rapid numbing effect that wears off in a few hours.  After meeting the  anesthesiologist and discussing her medical conditions and history, the anesthesiologist may decide  to place a long-acting pain medicine as well.  There are some medical conditions and home medications that may preclude a spinal block.    -Epidural block-An epidural block uses a catheter inserted into your lower back to deliver numbing medicine.  The epidural block in the spinal block are administered in a very similar location and technique; however, the epidural catheter is placed in a slightly different area around the spine as compared to a spinal block.    -Peripheral nerve block (PNB)-For TKA, the anesthesiologist may discuss performing a PNB.  This technique places local anesthetic directly around the major nerves in your thigh, and one or multiple locations.  These blocks numb only the leg that is injected, and do not affect the other leg.  PNB's are used in addition to another anesthesia technique, and are for postoperative pain relief only.    The advantages of regional anesthesia for joint replacement surgery are considered to be significant.  There is a significant reduction in blood loss and blood transfusions, less nausea/vomiting, less drowsiness, improved pain control after surgery, better diabetes control, and less association with infection.  Additionally, there also are reduced risks (as compared to general anesthesia techniques) with serious medical complications such as heart attack, stroke, pneumonia, respiratory depression, or development of a blood clot in your legs that can go to your lungs.  Like with any technique, there are risks.  With regional anesthesia, there is a low incidence of headache and nerve damage.  Most commonly though, if the regional technique proves challenging, it is a difficulty in getting the numbing medicine in close proximity to the nerves perform the nerve block.  Extremely rarely (1 in 200,000), there can be a collection of blood from around the nerves.  Some  patients may experience difficulty voiding (passing urine) for a period of time after a regional technique.

## 2023-03-21 ENCOUNTER — Inpatient Hospital Stay: Payer: Medicare (Managed Care) | Attending: Gastroenterology

## 2023-03-21 MED ORDER — LACTATED RINGERS IV SOLN
INTRAVENOUS | Status: DC
Start: 2023-03-21 — End: 2023-03-21

## 2023-03-21 MED ORDER — LIDOCAINE HCL 2 % IJ SOLN
2 | INTRAMUSCULAR | Status: DC | PRN
Start: 2023-03-21 — End: 2023-03-21

## 2023-03-21 MED ORDER — LIDOCAINE HCL (PF) 1 % IJ SOLN
1 | Freq: Once | INTRAMUSCULAR | Status: AC
Start: 2023-03-21 — End: 2023-03-21

## 2023-03-21 MED ORDER — PROPOFOL 1000 MG/100ML IV EMUL
1000 | INTRAVENOUS | Status: DC | PRN
Start: 2023-03-21 — End: 2023-03-21

## 2023-03-21 MED ORDER — LACTATED RINGERS IV SOLN
INTRAVENOUS | Status: DC | PRN
Start: 2023-03-21 — End: 2023-03-21

## 2023-03-21 MED ADMIN — lactated ringers IV soln infusion: INTRAVENOUS | @ 17:00:00 | NDC 00338011704

## 2023-03-21 MED ADMIN — propofol infusion: 20 | INTRAVENOUS | @ 17:00:00 | NDC 63323026969

## 2023-03-21 MED ADMIN — propofol infusion: 30 | INTRAVENOUS | @ 17:00:00 | NDC 63323026969

## 2023-03-21 MED ADMIN — lidocaine 2 % injection: 100 | INTRAVENOUS | @ 17:00:00 | NDC 63323048602

## 2023-03-21 MED ADMIN — propofol infusion: 100 | INTRAVENOUS | @ 17:00:00 | NDC 63323026969

## 2023-03-21 MED ADMIN — lactated ringers IV soln infusion: INTRAVENOUS | @ 16:00:00 | NDC 00338011704

## 2023-03-21 MED ADMIN — lidocaine PF 1 % injection 1 mL: 1 mL | INTRADERMAL | @ 16:00:00 | NDC 63323049243

## 2023-03-21 MED ADMIN — propofol infusion: 50 | INTRAVENOUS | @ 17:00:00 | NDC 63323026969

## 2023-03-21 MED FILL — LACTATED RINGERS IV SOLN: INTRAVENOUS | Qty: 1000

## 2023-03-21 NOTE — Progress Notes (Signed)
 Driver Vizzini,Joanne (Spouse)  [phone]  will drive and stay overnight with patient once patient is discharged

## 2023-03-21 NOTE — Anesthesia Postprocedure Evaluation (Signed)
 Department of Anesthesiology  Postprocedure Note    Patient: Elijah Perry  MRN: [id number]  Birthdate: 09-26-1940  Date of evaluation: 03/21/2023    Procedure Summary       Date: 03/21/23 Room / Location: [facility] 03 / [facility]    Anesthesia Start: 1308 Anesthesia Stop: 1344    Procedure: COLONOSCOPY DIAGNOSTIC w/ cold snare polypectomy (Lower GI Region) Diagnosis:       Incontinence of feces, unspecified fecal incontinence type      (Incontinence of feces, unspecified fecal incontinence type [R15.9])    Surgeons: Pattie Dale PARAS, MD Responsible Provider: Savannah Dallas RAMAN, MD    Anesthesia Type: General ASA Status: 3            Anesthesia Type: General    Aldrete Phase I:      Aldrete Phase II: Aldrete Score: 10    Anesthesia Post Evaluation    Patient location during evaluation: PACU  Patient participation: complete - patient participated  Level of consciousness: awake and alert  Airway patency: patent  Nausea: No clinically significant PONV.  Cardiovascular status: blood pressure returned to baseline  Respiratory status: acceptable  Hydration status: euvolemic  Comments: BP: 133/68 Pulse: 73  SpO2: 93      Pain management: adequate    No notable events documented.

## 2023-03-21 NOTE — Discharge Instructions (Signed)
 For the next 12 hours you should not:   1. drive   2. drink alcohol   3. operate any machinery   4. engage in activities that require mental sharpness or manual dexterity such as     cooking   5. take any drugs other than those prescribed by a physician   6. make any legal or financial decisions  Call your doctor's office immediately, if:   1. increased and continuing rectal bleeding   2. fever   3. increased abdominal pain    Take it easy today and resume normal activity tomorrow.    Resume previous diet.

## 2023-03-21 NOTE — Anesthesia Pre Procedure (Signed)
 Department of Anesthesiology  Preprocedure Note       Name:  Elijah Perry   Age:  82 y.o.  DOB:  17-Dec-1940                                          MRN:  [id number]         Date:  03/21/2023      Surgeon: Clotilde):  Pattie Dale PARAS, MD    Procedure: Procedure(s):  COLONOSCOPY DIAGNOSTIC    Medications prior to admission:   Prior to Admission medications    Medication Sig Start Date End Date Taking? Authorizing Provider   tamsulosin  (FLOMAX ) 0.4 MG capsule Take 1 capsule by mouth at bedtime   Yes [provider]   BREO ELLIPTA 200-25 MCG/ACT AEPB inhaler INHALE 1 PUFF BY MOUTH DAILY AS NEEDED 03/29/22   [provider]   pyridostigmine (MESTINON) 60 MG tablet Take 1 tablet by mouth 3 times daily 03/06/22   [provider]   vitamin D (ERGOCALCIFEROL) 1.25 MG (50000 UT) CAPS capsule Take 1 capsule by mouth every 7 days 09/11/16   [provider]   acetaminophen (TYLENOL) 500 MG tablet Take 2 tablets by mouth every 8 hours as needed 05/13/22   [provider]       Current medications:    No current facility-administered medications for this encounter.       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, M54.42, G89.29    Current moderate episode of major depressive disorder without prior episode (HCC) F32.1    Diabetes 1.5, 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    Diarrhea R19.7    Difficulty walking R26.2    Diplopia H53.2     Elevated PSA R97.20    Falls frequently R29.6    Gastroesophageal reflux disease without esophagitis K21.9    Hyperlipidemia E78.5    Irritable bowel syndrome with diarrhea K58.0    Leg weakness, bilateral R29.898    Lid retraction or lag H02.539    Low vitamin B12 level R79.89    Lower urinary tract symptoms (LUTS) R39.9    Memory change R41.3    Microscopic hematuria R31.29    Mild intermittent asthma without complication J45.20    Myalgia M79.10    Myasthenia gravis (HCC) G70.00    OSA on CPAP G47.33    Osteoarthritis of right knee M17.11    Overactive bladder N32.81    Pain in both lower extremities M79.604, M79.605    Pain in joint, lower leg M25.569    Pain in joint, shoulder region M25.519    Pain of right heel M79.671    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    Poor circulation of extremity R09.89  Prediabetes R73.03    Prostate cancer (HCC) C61    Proximal muscle weakness M62.81    Psychophysiological insomnia F51.04    Pure hypercholesterolemia E78.00    Recurrent UTI N39.0    Retina disorder, right H35.9    Right rotator cuff tear M75.101    Rosacea L71.9    SOB (shortness of breath) R06.02    Stiffness of left knee M25.662    Tremor of left hand R25.1    Tremors of nervous system R25.1    Urge incontinence N39.41    Uric acid nephrolithiasis N20.0    Vitamin B12 deficiency E53.8    Vitamin D deficiency E55.9    Weight loss R63.4       Past Medical History:        Diagnosis Date    Myasthenia gravis (HCC)     Prostate cancer (HCC)     Sleep apnea     uses CPAP       Past Surgical History:        Procedure Laterality Date    JOINT REPLACEMENT Bilateral     knees    TOTAL HIP ARTHROPLASTY         Social History:    Social History     Tobacco Use    Smoking status: Former     Current packs/day: 0.50     Average packs/day: 0.5 packs/day for 3.0 years (1.5 ttl pk-yrs)     Types: Cigarettes     Passive exposure: Never    Smokeless tobacco: Never   Substance Use Topics    Alcohol use: Not Currently                                 Counseling given: Not Answered      Vital Signs (Current):   Vitals:    03/20/23 0853 03/21/23 1136   BP:  (!) 152/77   Pulse:  73   Resp:  18   Temp:  97.9 F (36.6 C)   TempSrc:  Oral   SpO2:  97%   Weight: 108.9 kg (240 lb) 111.1 kg (244 lb 14.9 oz)   Height: 1.854 m (6' 1) 1.854 m (6' 1)                                              BP Readings from Last 3 Encounters:   03/21/23 (!) 152/77   06/14/22 139/73       NPO Status:                                                                                 BMI:   Wt Readings from Last 3 Encounters:   03/21/23 111.1 kg (244 lb 14.9 oz)   08/14/22 104.8 kg (231 lb)   06/14/22 104.8 kg (231 lb)     Body mass index is 32.31 kg/m.    CBC: No results found for: WBC, RBC, HGB, HCT, MCV, RDW, PLT    CMP: No results  found for: NA, K, CL, CO2, BUN, CREATININE, GFRAA, AGRATIO, LABGLOM, GLUCOSE, GLU, CALCIUM, BILITOT, ALKPHOS, AST, ALT    POC Tests: No results for input(s): POCGLU, POCNA, POCK, POCCL, POCBUN, POCHEMO, POCHCT in the last 72 hours.    Coags: No results found for: PROTIME, INR, APTT    HCG (If Applicable): No results found for: PREGTESTUR, PREGSERUM, HCG, HCGQUANT     ABGs: No results found for: PHART, PO2ART, PCO2ART, HCO3ART, BEART, O2SATART     Type & Screen (If Applicable):  No results found for: ABORH, LABANTI    Drug/Infectious Status (If Applicable):  No results found for: HIV, HEPCAB    COVID-19 Screening (If Applicable): No results found for: COVID19        Anesthesia Evaluation  Patient summary reviewed  Airway: Mallampati: II  TM distance: >3 FB   Neck ROM: full  Mouth opening: > = 3 FB   Dental: normal exam         Pulmonary:normal exam  breath sounds clear to auscultation  (+)     sleep apnea:       asthma:           Patient did not smoke on day of surgery.                 Cardiovascular:Negative CV ROS  Exercise tolerance: good (>4  METS)          Rhythm: regular  Rate: normal           Beta Blocker:  Not on Beta Blocker         Neuro/Psych:   (+) neuromuscular disease: myasthenia gravis, psychiatric history:            GI/Hepatic/Renal:   (+) GERD:          Endo/Other:    (+) Diabetes, hypothyroidism::..                 Abdominal:             Vascular: negative vascular ROS.         Other Findings:             Anesthesia Plan      general and TIVA     ASA 3       Induction: intravenous.      Anesthetic plan and risks discussed with patient.      Plan discussed with CRNA.                I have informed Elijah Perry of the nature and purpose of the type of anesthesia, the reasonable alternative anesthetic methods, pertinent foreseeable risks involved and the possibility of complications. I have explained that an alternative form of anesthesia may be required by unexpected conditions arising before or during the procedure. Elijah Perry that general anesthesia may be required for his/her safety or comfort. Questions have been answered to the satisfaction of the patient or his/her Perry who accepts the risks and agrees to proceed as planned. The above anesthetic review of the patient's medical history, his/her exam, his/her tests, his/her assessment, subsequent anesthetic plan and consent have been accomplished pre-procedure.    DALLAS GORMAN GIBNEY, MD   03/21/2023

## 2023-03-21 NOTE — H&P (Signed)
 History and Physical    Elijah Perry      1940-07-21  [id number]      [id number]    Pre-Procedure Diagnosis:  Incontinence of feces, unspecified fecal incontinence type [R15.9]  Patient being seen by DALE JINNY ARN, MD for: colonoscopy  Chief Complaint: No chief complaint on file.       Evaluation of past illnesses, surgeries, or injuries:   Yes  Past Medical History:   Diagnosis Date    Myasthenia gravis (HCC)     Prostate cancer (HCC)     Sleep apnea     uses CPAP     Past Surgical History:   Procedure Laterality Date    JOINT REPLACEMENT Bilateral     knees    TOTAL HIP ARTHROPLASTY         Allergies:    Allergies   Allergen Reactions    Adhesive Tape Hives, Itching and Rash     Other reaction(s): rash/itching    Other Diarrhea, Hives, Itching, Nausea And Vomiting, Swelling and Other (See Comments)     Other reaction(s): Dermatitis       Previous reactions to sedation/analgesia?  No    Review of current medications, supplement, herbals and nutraceuticals complete:  Yes  Current Facility-Administered Medications   Medication Dose Route Frequency Provider Last Rate Last Admin    lactated ringers  IV soln infusion   IntraVENous Continuous Arn Dale JINNY, MD 100 mL/hr at 03/21/23 1216 New Bag at 03/21/23 1216          Pertinent labs reviewed?  Yes    History of substance abuse?  No  History reviewed. No pertinent family history.  Social History     Socioeconomic History    Marital status: Married     Spouse name: Not on file    Number of children: Not on file    Years of education: Not on file    Highest education level: Not on file   Occupational History    Not on file   Tobacco Use    Smoking status: Former     Current packs/day: 0.50     Average packs/day: 0.5 packs/day for 3.0 years (1.5 ttl pk-yrs)     Types: Cigarettes     Passive exposure: Never    Smokeless tobacco: Never   Vaping Use    Vaping status: Never Used   Substance and Sexual Activity    Alcohol use: Not Currently    Drug use: Never    Sexual activity:  Not on file   Other Topics Concern    Not on file   Social History Narrative    Not on file     Social Determinants of Health     Financial Resource Strain: Low Risk  (06/14/2022)    Overall Financial Resource Strain (CARDIA)     Difficulty of Paying Living Expenses: Not hard at all   Food Insecurity: No Food Insecurity (06/14/2022)    Hunger Vital Sign     Worried About Running Out of Food in the Last Year: Never true     Ran Out of Food in the Last Year: Never true   Transportation Needs: Unknown (06/14/2022)    PRAPARE - Therapist, Art (Medical): Not on file     Lack of Transportation (Non-Medical): No   Physical Activity: Not on file   Stress: Not on file   Social Connections: Not on file   Intimate Partner Violence: Not on  file   Housing Stability: Unknown (06/14/2022)    Housing Stability Vital Sign     Unable to Pay for Housing in the Last Year: Not on file     Number of Places Lived in the Last Year: Not on file     Unstable Housing in the Last Year: No       Review of Systems:     Cardiac Status:  WNL  Mental Status:  WNL   Pulmonary Status:  WNL  NPO:  >4    Physical exam General appearance: alert, appears stated age, and cooperative  Lungs: clear to auscultation bilaterally  Heart: regular rate and rhythm  Abdomen: soft, non-tender; bowel sounds normal; no masses,  no organomegaly    Assessment/Impression: Diarrhea    Plan of treatment: Colon        DALE JINNY ARN, MD  03/21/2023  12:32 PM

## 2023-03-21 NOTE — Flowsheet Note (Signed)
 03/21/23 1402   Discharge Checklist   Ride and Caregiver Arranged Yes   Ride Caregiver Provider Joann Goetsch/ wife   Phone Number for Ride/Caregiver [phone]   Responsible party will drive the patient home Yes   Mobility at [facility]   Discharged with Documented Belongings Yes   Departure Mode With family   AVS Reviewed   AVS & discharge instructions reviewed with patient and/or representative? Yes   Reviewed instructions with Patient;Other (name and relationship in comment)   Level of Understanding Questions answered;Verbalized understanding

## 2023-03-25 ENCOUNTER — Telehealth: Payer: Self-pay | Admitting: Cardiovascular Disease

## 2023-03-25 NOTE — Telephone Encounter (Signed)
Charlie with [facility] is requesting a copy of patient's sleep study with Dr. Tresa Endo.   Phone#: [phone] Fax#: [phone]

## 2023-03-25 NOTE — Telephone Encounter (Signed)
Returned call to [employer], spoke with Selena Batten and informed them the patient had his sleep study done at [facility]. Gave the phone number [phone]) to the center. Informed them we do not have a copy of that to fax.

## 2023-03-28 NOTE — Telephone Encounter (Signed)
Caller Billey Gosling) stated [facility] is closed and the number provided does not work  Camera operator wants to know if sleep study results are available.

## 2023-05-26 ENCOUNTER — Encounter: Admit: 2023-05-26 | Discharge: 2023-05-26 | Payer: MEDICARE

## 2023-05-26 DIAGNOSIS — C61 Malignant neoplasm of prostate: Secondary | ICD-10-CM

## 2023-05-26 NOTE — Progress Notes (Signed)
Elijah Perry presents today for lab draw per Dr. Given order.   Dr. Chandra Batch was present in the clinic as incident to.     PSA obtained via venipuncture without any difficulty.    Patient will be notified with lab results.       Orders Placed This Encounter   Procedures    COLLECTION VENOUS BLOOD,VENIPUNCTURE    Prostate Specific Antigen, Total       Lindwood Qua, MA

## 2023-05-27 LAB — PROSTATE SPECIFIC ANTIGEN, TOTAL: PSA: 4.83 ng/mL — ABNORMAL HIGH (ref 0.00–4.00)

## 2023-06-03 ENCOUNTER — Ambulatory Visit: Admit: 2023-06-03 | Discharge: 2023-06-03 | Payer: MEDICARE | Attending: Urology | Admitting: Urology

## 2023-06-03 VITALS — Ht 73.0 in | Wt 251.0 lb

## 2023-06-03 DIAGNOSIS — C61 Malignant neoplasm of prostate: Secondary | ICD-10-CM

## 2023-06-03 MED ORDER — FINASTERIDE 5 MG PO TABS
5 | ORAL_TABLET | Freq: Every day | ORAL | 3 refills | Status: AC
Start: 2023-06-03 — End: ?

## 2023-06-03 NOTE — Progress Notes (Signed)
 Jeanpierre Thebeau  1940/11/04.  Encounter Date: 06/03/2023      ASSESSMENT:     ICD-10-CM    1. Malignant neoplasm of prostate (HCC)  C61       2. Benign prostatic hyperplasia with nocturia  N40.1     R35.1       3. Myasthenia gravis (HCC)  G70.00           -

## 2023-08-20 LAB — HEMOGLOBIN A1C
Estimated Avg Glucose, External: 126 mg/dL — ABNORMAL HIGH (ref 91–123)
Hemoglobin A1C, External: 6 % — ABNORMAL HIGH (ref 4.8–5.6)

## 2023-12-04 ENCOUNTER — Other Ambulatory Visit: Admit: 2023-12-04 | Discharge: 2023-12-04 | Payer: MEDICARE

## 2023-12-04 DIAGNOSIS — C61 Malignant neoplasm of prostate: Secondary | ICD-10-CM

## 2023-12-04 NOTE — Progress Notes (Signed)
 Elijah Perry presents today for lab draw per Dr. Bernett Brill order.   Dr. Rejeana Card was present in the clinic as incident to.     PSA obtained via venipuncture without any difficulty.    Patient will be notified with lab results.       Orders Placed This Encounter   Procedures    COLLECTION VENOUS BLOOD,VENIPUNCTURE    PSA,  ([facility])       Gaila Josephs

## 2023-12-05 LAB — PSA,  (DIAGNOSTIC UVA): PSA: 2.92 ng/mL (ref 0.00–4.00)

## 2023-12-11 ENCOUNTER — Ambulatory Visit: Admit: 2023-12-11 | Discharge: 2023-12-11 | Payer: MEDICARE

## 2023-12-11 VITALS — Ht 73.0 in | Wt 248.0 lb

## 2023-12-11 DIAGNOSIS — C61 Malignant neoplasm of prostate: Secondary | ICD-10-CM

## 2023-12-11 MED ORDER — TAMSULOSIN HCL 0.4 MG PO CAPS
0.4 | ORAL_CAPSULE | Freq: Every evening | ORAL | 3 refills | 30.00000 days | Status: AC
Start: 2023-12-11 — End: ?

## 2023-12-11 NOTE — Progress Notes (Signed)
 Elijah Perry  05/15/1941.  Encounter Date: 12/11/2023    My supervising physician today is Dr. Broadus Canes.   This patient is being followed long term for the care of the conditions outlined below.      ASSESSMENT:     ICD-10-CM    1. Malignant neoplasm of prostate (HCC)  C61       2. Benign prostatic hyperplasia with nocturia  N40.1     R35.1            - Prostate Cancer, GS 3+3 in 1/12 cores per previous urologic records diagnosed s/p TRUS biopsy on 09/28/13. Pathology report not on file.                On active surveillance.               S/p TRUS biopsy 01/19/16. Pathology report not on file.               MRI Prostate 01/29/19: There are no suspicious lesions in the prostate. The prostate transition zone is enlarged with benign prostatic hyperplasia. Prostate volume: 79.29 mL               Patient deferred DRE 07/10/22               MRI of pelvis 08/07/22: Prostate volume 110 gms, PIRAD 2, limited exam due to artifact   DRE 06/03/23: 60+ grams, no nodules   Most recent PSA 2.92 ng/mL (~5.84 on Proscar ) on 12/04/23     - Nephrolithiasis              Surgical History:               S/p cystoscopy, left ureteroscopy, and left ureteral stent placement on 08/03/2019. Patient's left ureteral stone was unable to be addressed due to relative stenosis of the left ureter              S/p cysto, left URS, basket stone extraction, left RPG, left ureteral stent exchange on 08/17/19 with Dr. Leighton Punches                 Medical Therapy: N/A              Lindsay Rho Composition: 08/2019 (100% Uric Acid)              24 Hr Urine Panel: N/A                 Imaging:               CTU 07/26/19:  The left kidney demonstrates pelvocaliectasis and proximal ureterectasis extending to the mid ureter where there is an 5.7 mm obstructing stone.  CT A/P 05/13/22: Asymmetric moderate hydronephrosis of the right kidney with delayed nephrographic enhancement. There is hydroureter to the level of the pelvis where there is an obstructing 5 mm calculus lying  approximately 2 cm above the right UVJ.  CT A/P 06/02/22: There is no nephrolithiasis. There is no hydronephrosis, hydroureter, or other signs of obstructive nephropathy. Small calculus within the urinary bladder.       - BPH with LUTS. Urgency   Taking Flomax 0.8 mg and Proscar  5 mg daily     - PMH: Myasthenia Gravis     PLAN:    Reviewed most recent PSA, 2.92 ng/mL on 12/04/23- decreased from prior appropriately on Proscar    Continue Flomax 0.8 mg daily- happy to refill as needed   Continue Proscar  5 mg daily- happy  to refill as needed   Discussed behavioral modifications including: limiting bladder irritants  Follow up in 6 months with PSA prior   At his age of 55 and with his other health problems I would recommend transitioning from surveillance to observation for his prostate cancer.  Therefore no plans for further prostate biopsies at this time and would only treat if he becomes symptomatic or significant clinical changes.    Chief Complaint   Patient presents with    Prostate Cancer     Room 5       HISTORY OF PRESENT ILLNESS:  Elijah Perry is a 83 y.o. male who presents today in follow up for prostate cancer on AS.      C/o nocturia 3-4x. Continues on Flomax 0.8 mg and Proscar  5 mg daily. Bothersome urinary urgency. Drinking sweet tea and soda.     He attributes his bothersome diarrhea to Myasthenia Gravis. He hopes to follow up with a neurologist with this. He denies dysuria.    Erections not a major concern at this time.    Social: Moved here recently from [facility].     Past Medical History:   Diagnosis Date    Myasthenia gravis (HCC)     Prostate cancer (HCC)     Sleep apnea     uses CPAP         Current Outpatient Medications:     albuterol sulfate HFA (PROVENTIL;VENTOLIN;PROAIR) 108 (90 Base) MCG/ACT inhaler, Inhale 2 puffs into the lungs every 6 hours as needed, Disp: , Rfl:     losartan (COZAAR) 50 MG tablet, Take 1 tablet by mouth daily, Disp: , Rfl:     tamsulosin (FLOMAX) 0.4 MG capsule, Take 2 capsules by  mouth at bedtime, Disp: 180 capsule, Rfl: 3    finasteride  (PROSCAR ) 5 MG tablet, Take 1 tablet by mouth daily, Disp: 90 tablet, Rfl: 3    BREO ELLIPTA 200-25 MCG/ACT AEPB inhaler, INHALE 1 PUFF BY MOUTH DAILY AS NEEDED, Disp: , Rfl:     pyridostigmine (MESTINON) 60 MG tablet, Take 1 tablet by mouth 3 times daily, Disp: , Rfl:     vitamin D (ERGOCALCIFEROL) 1.25 MG (50000 UT) CAPS capsule, Take 1 capsule by mouth every 7 days, Disp: , Rfl:     acetaminophen (TYLENOL) 500 MG tablet, Take 2 tablets by mouth every 8 hours as needed, Disp: , Rfl:     Past Surgical History:   Procedure Laterality Date    COLONOSCOPY N/A 03/21/2023    COLONOSCOPY DIAGNOSTIC w/ cold snare polypectomy performed by Kelli Pates, MD at [facility] ENDOSCOPY    JOINT REPLACEMENT Bilateral     knees    TOTAL HIP ARTHROPLASTY         Social History     Tobacco Use    Smoking status: Former     Current packs/day: 0.50     Average packs/day: 0.5 packs/day for 3.0 years (1.5 ttl pk-yrs)     Types: Cigarettes     Passive exposure: Never    Smokeless tobacco: Never   Vaping Use    Vaping status: Never Used   Substance Use Topics    Alcohol use: Not Currently    Drug use: Never       Allergies   Allergen Reactions    Adhesive Tape Hives, Itching and Rash     Other reaction(s): rash/itching    Other/Food Diarrhea, Hives, Itching, Nausea And Vomiting, Swelling and Other (See Comments)  Other reaction(s): Dermatitis       History reviewed. No pertinent family history.    Current Outpatient Medications   Medication Sig Dispense Refill    albuterol sulfate HFA (PROVENTIL;VENTOLIN;PROAIR) 108 (90 Base) MCG/ACT inhaler Inhale 2 puffs into the lungs every 6 hours as needed      losartan (COZAAR) 50 MG tablet Take 1 tablet by mouth daily      tamsulosin (FLOMAX) 0.4 MG capsule Take 2 capsules by mouth at bedtime 180 capsule 3    finasteride  (PROSCAR ) 5 MG tablet Take 1 tablet by mouth daily 90 tablet 3    BREO ELLIPTA 200-25 MCG/ACT AEPB inhaler INHALE 1 PUFF BY  MOUTH DAILY AS NEEDED      pyridostigmine (MESTINON) 60 MG tablet Take 1 tablet by mouth 3 times daily      vitamin D (ERGOCALCIFEROL) 1.25 MG (50000 UT) CAPS capsule Take 1 capsule by mouth every 7 days      acetaminophen (TYLENOL) 500 MG tablet Take 2 tablets by mouth every 8 hours as needed       No current facility-administered medications for this visit.         PHYSICAL EXAMINATION:   Ht 1.854 m (6\' 1" )   Wt 112.5 kg (248 lb)   BMI 32.72 kg/m   Constitutional: WDWN, Pleasant and appropriate affect, No acute distress.    Respiratory: No respiratory distress or difficulties  Skin: No jaundice.    Neuro/Psych:  Alert and oriented x 3. Affect appropriate.       REVIEW OF LABS AND IMAGING:    No results found for this visit on 12/11/23.    PSA Trend   [facility]    PSA   Latest Ref Rng 0.00 - 4.00 ng/mL   08/14/2022 7.2 (H)    08/27/2022 5.43   05/26/2023 4.83 (H)    12/04/2023 2.92 (~5.84 on Proscar )      Jaiden Dinkins E Becca Bayne, APRN - NP

## 2024-06-15 ENCOUNTER — Encounter

## 2024-06-16 ENCOUNTER — Other Ambulatory Visit: Admit: 2024-06-16 | Discharge: 2024-06-16 | Payer: MEDICARE

## 2024-06-16 DIAGNOSIS — C61 Malignant neoplasm of prostate: Principal | ICD-10-CM

## 2024-06-16 LAB — PSA,  (DIAGNOSTIC UVA): PSA: 2.97 ng/mL (ref 0.00–4.00)

## 2024-06-16 NOTE — Progress Notes (Signed)
"  Elijah Perry presents today for lab draw per Dr. Verdel order.   Dr. Marshia was present in the clinic as incident to.     PSA obtained via venipuncture without any difficulty.    Patient will be notified with lab results.       Orders Placed This Encounter   Procedures    COLLECTION VENOUS BLOOD,VENIPUNCTURE    PSA,  ([facility])       Alyce Massa, MA   "

## 2024-06-17 NOTE — Result Encounter Note (Signed)
"  Results reviewed. Will review at follow up as scheduled.    "

## 2024-06-22 ENCOUNTER — Encounter
